# Patient Record
Sex: Female | Born: 1935 | Race: White | Hispanic: No | Smoking: Former smoker
Health system: Southern US, Community
[De-identification: ages and names within clinical notes are randomized; demographics above are authoritative.]

## PROBLEM LIST (undated history)

## (undated) DIAGNOSIS — A0472 Enterocolitis due to Clostridium difficile, not specified as recurrent: Secondary | ICD-10-CM

## (undated) DIAGNOSIS — W19XXXA Unspecified fall, initial encounter: Secondary | ICD-10-CM

## (undated) DIAGNOSIS — K219 Gastro-esophageal reflux disease without esophagitis: Secondary | ICD-10-CM

## (undated) DIAGNOSIS — M1990 Unspecified osteoarthritis, unspecified site: Secondary | ICD-10-CM

## (undated) DIAGNOSIS — J302 Other seasonal allergic rhinitis: Secondary | ICD-10-CM

## (undated) DIAGNOSIS — K529 Noninfective gastroenteritis and colitis, unspecified: Secondary | ICD-10-CM

## (undated) DIAGNOSIS — K5792 Diverticulitis of intestine, part unspecified, without perforation or abscess without bleeding: Secondary | ICD-10-CM

## (undated) HISTORY — PX: TONSILLECTOMY: SUR1361

## (undated) HISTORY — PX: HEMORRHOID SURGERY: SHX153

## (undated) HISTORY — PX: MOLE REMOVAL: SHX2046

## (undated) HISTORY — PX: APPENDECTOMY: SHX54

## (undated) HISTORY — DX: Noninfective gastroenteritis and colitis, unspecified: K52.9

## (undated) HISTORY — PX: CATARACT EXTRACTION, BILATERAL: SHX1313

## (undated) HISTORY — PX: EYE SURGERY: SHX253

---

## 2007-10-05 ENCOUNTER — Ambulatory Visit: Payer: Self-pay

## 2007-10-06 ENCOUNTER — Ambulatory Visit: Payer: Self-pay

## 2008-02-07 ENCOUNTER — Ambulatory Visit: Payer: Self-pay

## 2008-07-09 ENCOUNTER — Ambulatory Visit: Payer: Self-pay | Admitting: Family Medicine

## 2009-01-21 ENCOUNTER — Ambulatory Visit: Payer: Self-pay | Admitting: Internal Medicine

## 2009-11-25 ENCOUNTER — Ambulatory Visit: Payer: Self-pay | Admitting: Internal Medicine

## 2010-02-12 ENCOUNTER — Ambulatory Visit: Payer: Self-pay | Admitting: Internal Medicine

## 2010-08-26 ENCOUNTER — Ambulatory Visit: Payer: Self-pay | Admitting: Internal Medicine

## 2011-06-15 ENCOUNTER — Encounter (INDEPENDENT_AMBULATORY_CARE_PROVIDER_SITE_OTHER): Payer: Medicare Other | Admitting: Ophthalmology

## 2011-06-15 DIAGNOSIS — H43819 Vitreous degeneration, unspecified eye: Secondary | ICD-10-CM

## 2011-06-15 DIAGNOSIS — H4310 Vitreous hemorrhage, unspecified eye: Secondary | ICD-10-CM

## 2011-06-15 DIAGNOSIS — H3530 Unspecified macular degeneration: Secondary | ICD-10-CM

## 2011-07-16 ENCOUNTER — Encounter (INDEPENDENT_AMBULATORY_CARE_PROVIDER_SITE_OTHER): Payer: Medicare Other | Admitting: Ophthalmology

## 2011-07-16 DIAGNOSIS — H33309 Unspecified retinal break, unspecified eye: Secondary | ICD-10-CM

## 2011-07-16 DIAGNOSIS — H3530 Unspecified macular degeneration: Secondary | ICD-10-CM

## 2011-07-16 DIAGNOSIS — H43819 Vitreous degeneration, unspecified eye: Secondary | ICD-10-CM

## 2011-10-26 ENCOUNTER — Ambulatory Visit: Payer: Self-pay | Admitting: Internal Medicine

## 2012-03-16 ENCOUNTER — Ambulatory Visit: Payer: Self-pay | Admitting: Gastroenterology

## 2012-03-18 LAB — PATHOLOGY REPORT

## 2012-07-19 ENCOUNTER — Ambulatory Visit (INDEPENDENT_AMBULATORY_CARE_PROVIDER_SITE_OTHER): Payer: Medicare Other | Admitting: Ophthalmology

## 2012-10-27 ENCOUNTER — Ambulatory Visit: Payer: Self-pay | Admitting: Internal Medicine

## 2012-10-27 IMAGING — MG MM CAD SCREENING MAMMO
1 series · 5 of 5 positions shown · non-contrast
Comparison: [DATE], [DATE], [DATE].

REASON FOR EXAM: scr mammo no order
COMMENTS:

PROCEDURE:     MAM - MAM DGTL SCRN MAM NO ORDER W/CAD  - [DATE]  [DATE]
RESULT:

[R CC · right · 5 of 5 slices shown]
[im 1/5]
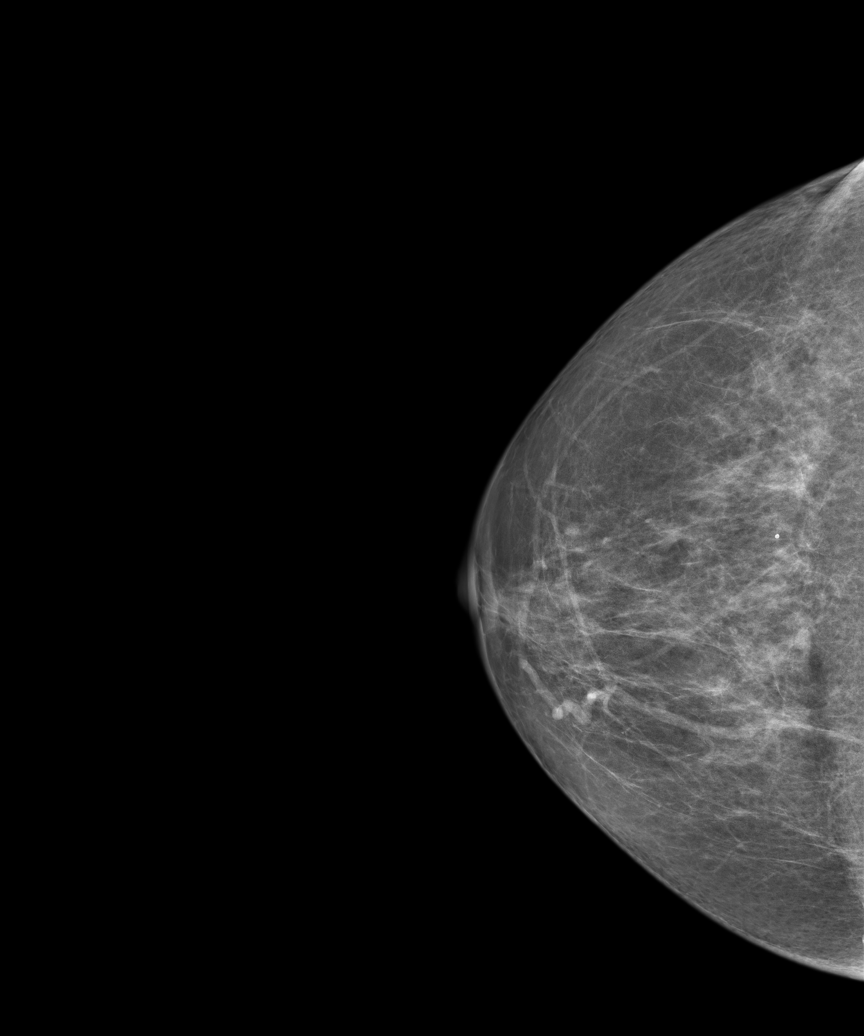
[im 2/5]
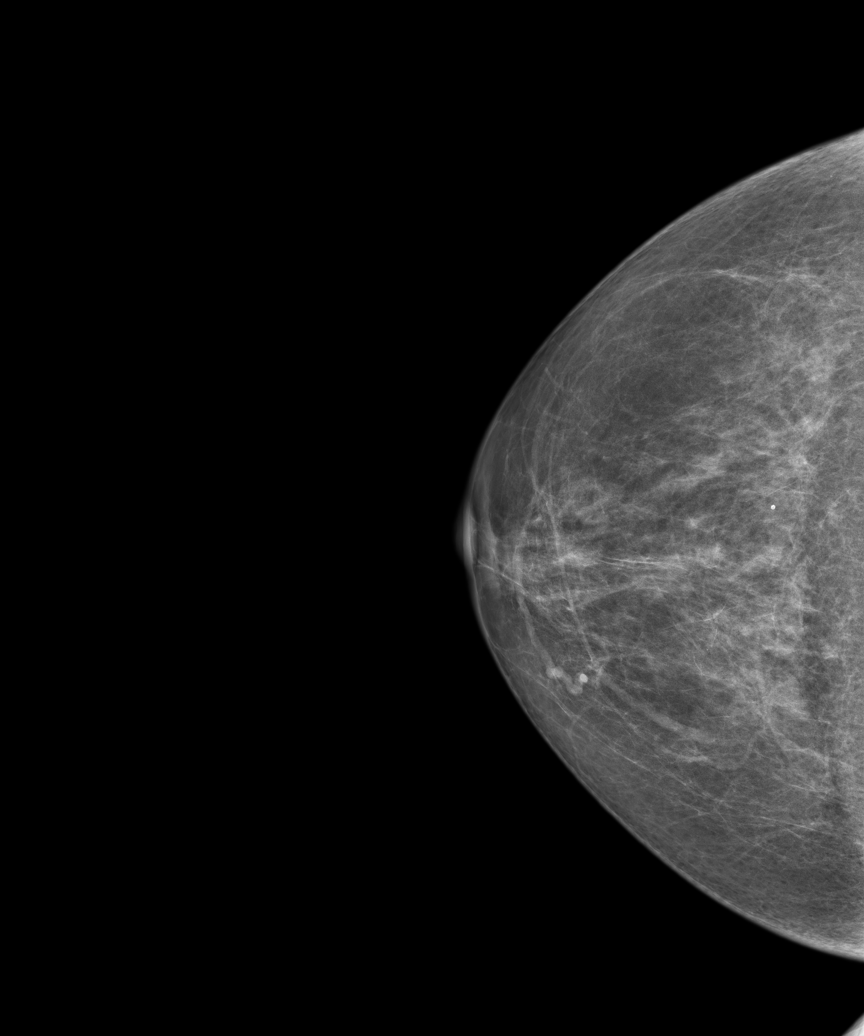
[im 3/5]
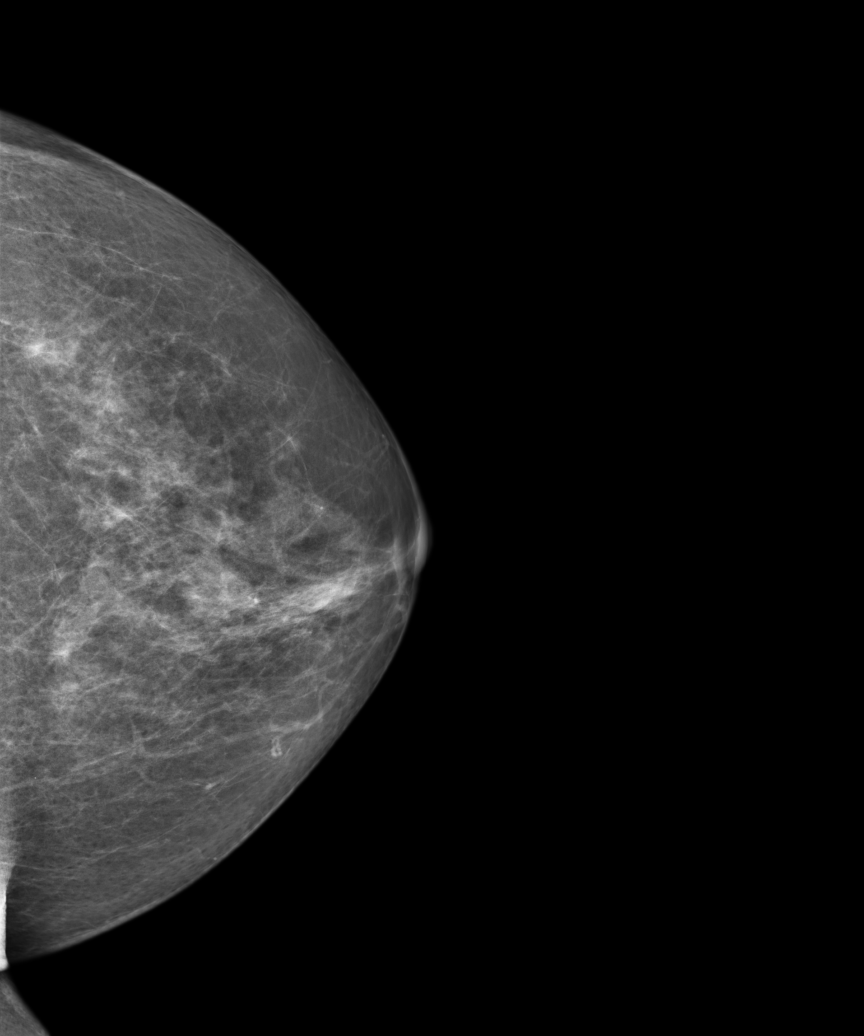
[im 4/5]
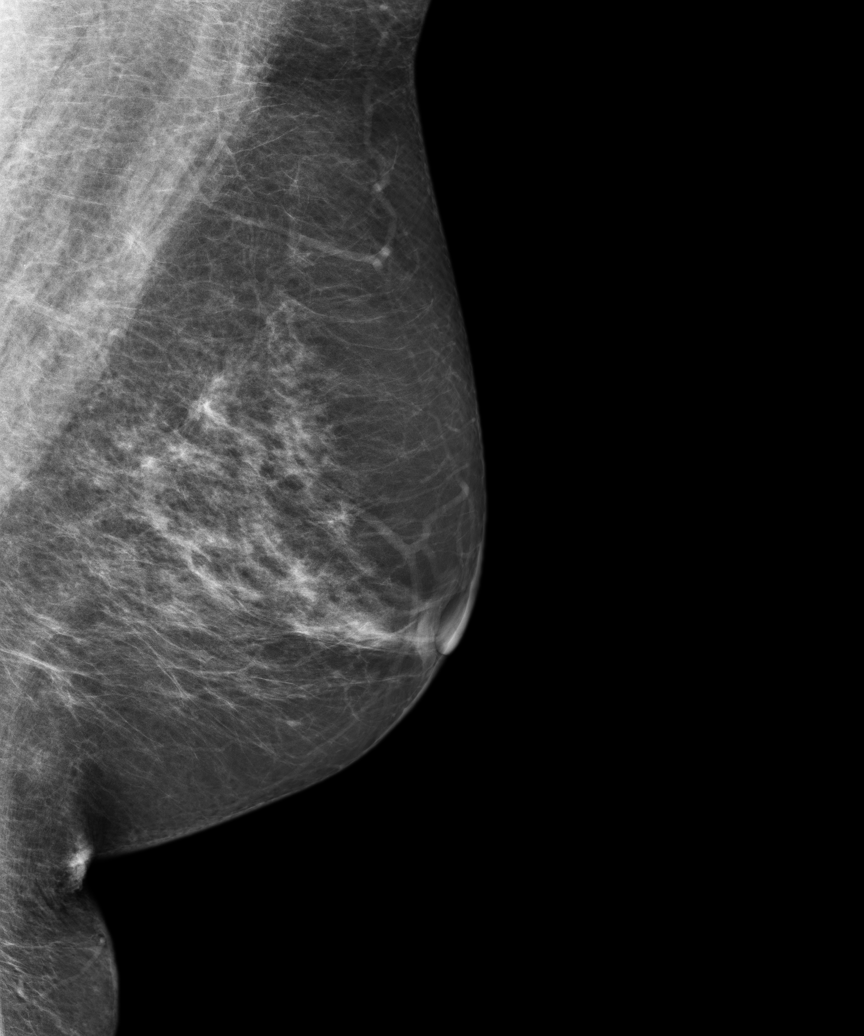
[im 5/5]
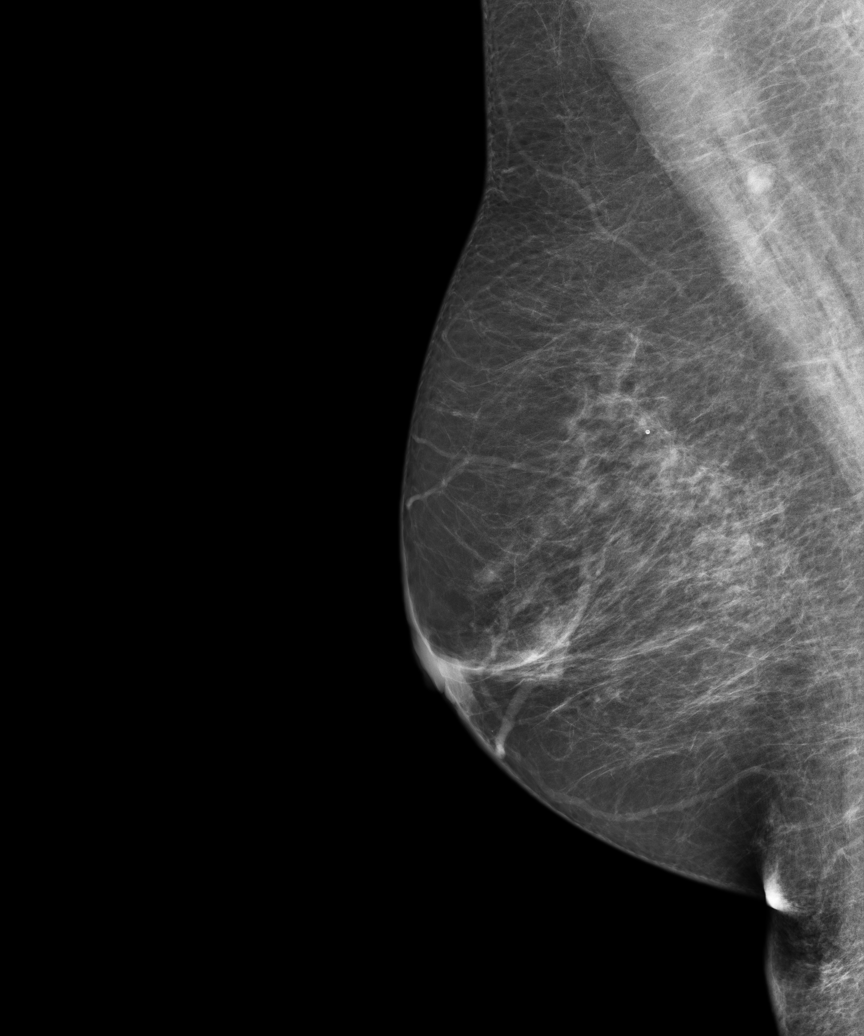

[5 of 5 positions shown; findings below may reference images not displayed]

FINDINGS: There is scattered fibroglandular tissue. No suspicious masses or
calcifications are identified. No areas of architectural distortion.
IMPRESSION: 1.     BI-RADS: Category 1 Negative.
2.     Recommend continued annual screening mammography.

BREAST COMPOSITION: The breast composition is SCATTERED FIBROGLANDULAR
TISSUE (glandular tissue is 25-50%).

Thank you for this opportunity to contribute to the care of your patient.

A NEGATIVE MAMMOGRAM REPORT DOES NOT PRECLUDE BIOPSY OR OTHER EVALUATION OF
A CLINICALLY PALPABLE OR OTHERWISE SUSPICIOUS MASS OR LESION. BREAST CANCER
MAY NOT BE DETECTED BY MAMMOGRAPHY IN UP TO 10% OF CASES.

## 2012-11-03 ENCOUNTER — Ambulatory Visit: Payer: Self-pay | Admitting: Gastroenterology

## 2012-11-07 LAB — PATHOLOGY REPORT

## 2013-01-03 ENCOUNTER — Other Ambulatory Visit: Payer: Self-pay | Admitting: Gastroenterology

## 2013-01-23 ENCOUNTER — Other Ambulatory Visit: Payer: Self-pay | Admitting: Gastroenterology

## 2013-02-07 ENCOUNTER — Other Ambulatory Visit: Payer: Self-pay | Admitting: Gastroenterology

## 2013-02-08 LAB — CLOSTRIDIUM DIFFICILE(ARMC)

## 2013-02-17 ENCOUNTER — Ambulatory Visit: Payer: Self-pay | Admitting: Unknown Physician Specialty

## 2013-03-31 ENCOUNTER — Other Ambulatory Visit: Payer: Self-pay | Admitting: Gastroenterology

## 2013-03-31 LAB — CLOSTRIDIUM DIFFICILE(ARMC)

## 2013-04-01 LAB — WBCS, STOOL

## 2013-04-03 LAB — STOOL CULTURE

## 2013-06-02 ENCOUNTER — Emergency Department: Payer: Self-pay

## 2013-06-02 LAB — CBC
HCT: 38.5 % (ref 35.0–47.0)
HGB: 13.4 g/dL (ref 12.0–16.0)
MCH: 33.3 pg (ref 26.0–34.0)
MCHC: 34.8 g/dL (ref 32.0–36.0)
MCV: 96 fL (ref 80–100)
PLATELETS: 232 10*3/uL (ref 150–440)
RBC: 4.03 10*6/uL (ref 3.80–5.20)
RDW: 12.2 % (ref 11.5–14.5)
WBC: 6.5 10*3/uL (ref 3.6–11.0)

## 2013-06-02 LAB — URINALYSIS, COMPLETE
BILIRUBIN, UR: NEGATIVE
Blood: NEGATIVE
Glucose,UR: NEGATIVE mg/dL (ref 0–75)
Ketone: NEGATIVE
Nitrite: NEGATIVE
PROTEIN: NEGATIVE
Ph: 5 (ref 4.5–8.0)
Specific Gravity: 1.005 (ref 1.003–1.030)
Squamous Epithelial: NONE SEEN
WBC UR: 3 /HPF (ref 0–5)

## 2013-06-02 LAB — COMPREHENSIVE METABOLIC PANEL
ALT: 69 U/L (ref 12–78)
ANION GAP: 6 — AB (ref 7–16)
AST: 42 U/L — AB (ref 15–37)
Albumin: 4.2 g/dL (ref 3.4–5.0)
Alkaline Phosphatase: 81 U/L
BUN: 14 mg/dL (ref 7–18)
Bilirubin,Total: 0.4 mg/dL (ref 0.2–1.0)
CALCIUM: 9.8 mg/dL (ref 8.5–10.1)
CREATININE: 0.77 mg/dL (ref 0.60–1.30)
Chloride: 104 mmol/L (ref 98–107)
Co2: 29 mmol/L (ref 21–32)
Glucose: 94 mg/dL (ref 65–99)
Osmolality: 278 (ref 275–301)
Potassium: 4.1 mmol/L (ref 3.5–5.1)
Sodium: 139 mmol/L (ref 136–145)
Total Protein: 7.9 g/dL (ref 6.4–8.2)

## 2013-06-02 LAB — CLOSTRIDIUM DIFFICILE(ARMC)

## 2013-06-07 LAB — STOOL CULTURE

## 2013-06-22 ENCOUNTER — Encounter: Payer: Self-pay | Admitting: Podiatry

## 2013-06-22 ENCOUNTER — Ambulatory Visit (INDEPENDENT_AMBULATORY_CARE_PROVIDER_SITE_OTHER): Payer: 59 | Admitting: Podiatry

## 2013-06-22 VITALS — BP 127/71 | HR 73 | Resp 16 | Ht 64.0 in | Wt 137.0 lb

## 2013-06-22 DIAGNOSIS — G588 Other specified mononeuropathies: Secondary | ICD-10-CM

## 2013-06-22 DIAGNOSIS — G5760 Lesion of plantar nerve, unspecified lower limb: Secondary | ICD-10-CM

## 2013-06-22 DIAGNOSIS — M79609 Pain in unspecified limb: Secondary | ICD-10-CM

## 2013-06-22 NOTE — Progress Notes (Signed)
She presents today with a chief complaint of irritation of her third interdigital space once again. She states that the neuroma has started bothering me due to to a 15 [place] hike.  Objective: Vital signs are stable she is alert and oriented x3. Pulses are palpable bilateral. Palpable Mulder's click and pain on palpation to the third interdigital space bilateral foot.  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  Plan: Injected Kenalog and local anesthetic today to the bilateral third interdigital space. I will followup with her in 3-4 weeks

## 2013-06-23 ENCOUNTER — Telehealth: Payer: Self-pay | Admitting: *Deleted

## 2013-06-23 NOTE — Telephone Encounter (Signed)
PT CALLED STATED SHE HAD INJECTIONS IN HER FEET, HAD A FEW QUESTIONS. CALLED HER BACK BUT SHE MUST HAVE LEFT FOR OUT OF TOWN

## 2013-07-03 ENCOUNTER — Telehealth: Payer: Self-pay | Admitting: Podiatry

## 2013-07-05 ENCOUNTER — Encounter: Payer: Self-pay | Admitting: Podiatry

## 2013-07-05 ENCOUNTER — Telehealth: Payer: Self-pay | Admitting: Podiatry

## 2013-07-05 NOTE — Telephone Encounter (Signed)
Left message that letter was ready.

## 2013-07-05 NOTE — Telephone Encounter (Signed)
Message copied by Jonnie Finner on Wed Jul 05, 2013  8:37 AM ------      Message from: [place], [place] M      Created: Mon Jul 03, 2013  2:35 PM      Regarding: CART EXCUSE NEEDED      Contact: [phone]       PT WOULD LIKE A LETTER STATING THAT SHE IS NOT ABLE TO WALK THE GOLF COURSE AND NEEDS A CART AT [facility].  PT STATES HER RT FOOT IS FINE BUT LT IS STILL HURTING AFTER SHOTS. ------

## 2013-07-05 NOTE — Telephone Encounter (Signed)
PT WOULD LIKE TO SPEAK WITH DR. HYATT- NOT SURE WHY

## 2013-07-06 NOTE — Telephone Encounter (Signed)
CALLED AND SPOKE TO PT AND SHE STATED HER FOOT STILL HURT. I TOLD HER TO ELEVATE, STAY OFF OF IT, ICE AND TAKE WHAT SHE TAKES FOR HEADACES AND WE WILL SEE HER ON NEXT APPT. SAID IT WAS IMPOSSIBLE TO STAY OFF OF IT BECAUSE SHE IS PLAYING GOLF. TOLD HER TO DO THE BEST SHE CAN AND PT UNDERSTOOD.

## 2013-07-20 ENCOUNTER — Ambulatory Visit (INDEPENDENT_AMBULATORY_CARE_PROVIDER_SITE_OTHER): Payer: 59 | Admitting: Podiatry

## 2013-07-20 ENCOUNTER — Encounter: Payer: Self-pay | Admitting: Podiatry

## 2013-07-20 VITALS — Resp 16 | Ht 64.0 in | Wt 139.0 lb

## 2013-07-20 DIAGNOSIS — G5760 Lesion of plantar nerve, unspecified lower limb: Secondary | ICD-10-CM

## 2013-07-20 DIAGNOSIS — G588 Other specified mononeuropathies: Secondary | ICD-10-CM

## 2013-07-21 NOTE — Progress Notes (Signed)
She presents today for followup of her neuroma third interdigital space bilateral. Last time we injected Kenalog to the interdigital spaces bilaterally. She states it is really not doing much better.  Objective: Vital signs are stable she is alert and oriented x3. At this point I encouraged her to start with dehydrated alcohol. She palpable Mulder's click to the third interspace bilateral foot.  Assessment: Neuroma third interspace bilateral.  Plan #1 first dose of dehydrated alcohol third interdigital space bilateral.

## 2013-07-31 ENCOUNTER — Ambulatory Visit: Payer: Self-pay | Admitting: Gastroenterology

## 2013-08-09 ENCOUNTER — Ambulatory Visit (INDEPENDENT_AMBULATORY_CARE_PROVIDER_SITE_OTHER): Payer: 59 | Admitting: Podiatry

## 2013-08-09 VITALS — BP 134/71 | HR 72 | Resp 16

## 2013-08-09 DIAGNOSIS — G5760 Lesion of plantar nerve, unspecified lower limb: Secondary | ICD-10-CM

## 2013-08-09 DIAGNOSIS — G588 Other specified mononeuropathies: Secondary | ICD-10-CM

## 2013-08-09 NOTE — Progress Notes (Signed)
She presents today states that she couldn't alcohol shots in both feet.  Objective: Vital signs are stable she is alert and oriented x3. Pulses are palpable bilateral. Palpable Mulder's click to the third interdigital space of the bilateral foot. Pain on palpation to these areas.  Assessment: Neuroma third interdigital space bilateral foot.   Plan: First dose of dehydrated alcohol was injected to the bilateral third interdigital spaces today. Followup with her in 3 weeks.

## 2013-08-10 ENCOUNTER — Ambulatory Visit: Payer: 59 | Admitting: Podiatry

## 2013-08-14 ENCOUNTER — Ambulatory Visit: Payer: 59 | Admitting: Podiatry

## 2013-09-04 ENCOUNTER — Ambulatory Visit (INDEPENDENT_AMBULATORY_CARE_PROVIDER_SITE_OTHER): Payer: 59 | Admitting: Podiatry

## 2013-09-04 VITALS — BP 108/59 | HR 81 | Resp 16

## 2013-09-04 DIAGNOSIS — G5760 Lesion of plantar nerve, unspecified lower limb: Secondary | ICD-10-CM

## 2013-09-04 DIAGNOSIS — G588 Other specified mononeuropathies: Secondary | ICD-10-CM

## 2013-09-04 NOTE — Progress Notes (Signed)
She presents today for her third injection of dehydrated alcohol to the third interdigital space of the bilateral foot. She states that she's approximately 50% better on the right foot in nearly well in the left foot.  Objective: Vital signs are stable she is alert and oriented x3. Pain on palpation to the third interdigital space bilaterally with a palpable Mulder's click.  Assessment: Neuroma third interdigital space bilateral.  Plan: Third injection of dehydrated alcohol to the third interdigital space of the bilateral foot was performed today. Followup with her in 3 weeks

## 2013-09-25 ENCOUNTER — Ambulatory Visit (INDEPENDENT_AMBULATORY_CARE_PROVIDER_SITE_OTHER): Payer: 59 | Admitting: Podiatry

## 2013-09-25 ENCOUNTER — Ambulatory Visit: Payer: 59 | Admitting: Podiatry

## 2013-09-25 VITALS — BP 122/61 | HR 79 | Resp 16

## 2013-09-25 DIAGNOSIS — G5760 Lesion of plantar nerve, unspecified lower limb: Secondary | ICD-10-CM

## 2013-09-25 DIAGNOSIS — G588 Other specified mononeuropathies: Secondary | ICD-10-CM

## 2013-09-25 NOTE — Progress Notes (Signed)
She presents today for her fourth dehydrated alcohol injection to the third interdigital space bilaterally. She states that she's doing quite well and not a lot of pain.  Objective: Vital signs are stable she is alert and oriented x3. Pulses are palpable. She still has Mulder's click to the third interdigital space bilaterally with pain left greater than right.  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  Plan: Injected her fourth dose of dehydrated alcohol to the third interdigital space bilateral. Followup with her in 3 weeks

## 2013-10-16 ENCOUNTER — Ambulatory Visit (INDEPENDENT_AMBULATORY_CARE_PROVIDER_SITE_OTHER): Payer: 59 | Admitting: Podiatry

## 2013-10-16 VITALS — BP 131/70 | HR 74 | Resp 16

## 2013-10-16 DIAGNOSIS — G588 Other specified mononeuropathies: Secondary | ICD-10-CM

## 2013-10-16 DIAGNOSIS — G5760 Lesion of plantar nerve, unspecified lower limb: Secondary | ICD-10-CM

## 2013-10-16 NOTE — Progress Notes (Signed)
She presents today stating that she's been doing very well with her neuromas that this was started tingling in her she refers to her third interdigital space of her right foot.  Objective: Pulses are strongly palpable bilateral. Neurologic sensorium is intact per since once the monofilament. She does have a palpable Mulder's click to the third interdigital space right greater than left.  Assessment: Neuromas bilateral.  Plan: Injected the neuromas today once again with dehydrated alcohol.

## 2013-10-18 DIAGNOSIS — I4510 Unspecified right bundle-branch block: Secondary | ICD-10-CM | POA: Insufficient documentation

## 2013-11-06 ENCOUNTER — Ambulatory Visit (INDEPENDENT_AMBULATORY_CARE_PROVIDER_SITE_OTHER): Payer: 59 | Admitting: Podiatry

## 2013-11-06 VITALS — BP 122/73 | HR 65 | Resp 16

## 2013-11-06 DIAGNOSIS — G5760 Lesion of plantar nerve, unspecified lower limb: Secondary | ICD-10-CM

## 2013-11-06 DIAGNOSIS — G588 Other specified mononeuropathies: Secondary | ICD-10-CM

## 2013-11-06 NOTE — Progress Notes (Signed)
She presents today for followup of her neuroma third interdigital space bilaterally. The right seems to be bothering her more now than the left.  Objective: Vital signs are stable she is alert and in x3. She has pain on palpation third interdigital space bilateral foot but much less to the left foot and not with ambulation.  Assessment: Neuroma third interdigital space right foot greater than left.  Plan: Injected her right third interdigital space of dehydrated alcohol. Followup with her in 3 weeks prior to her leaving for holiday.

## 2013-11-21 ENCOUNTER — Ambulatory Visit: Payer: Self-pay | Admitting: Internal Medicine

## 2013-11-29 ENCOUNTER — Encounter: Payer: Self-pay | Admitting: Podiatry

## 2013-11-29 ENCOUNTER — Ambulatory Visit (INDEPENDENT_AMBULATORY_CARE_PROVIDER_SITE_OTHER): Payer: 59 | Admitting: Podiatry

## 2013-11-29 VITALS — BP 120/68 | HR 70 | Resp 16

## 2013-11-29 DIAGNOSIS — G588 Other specified mononeuropathies: Secondary | ICD-10-CM

## 2013-11-29 DIAGNOSIS — G5760 Lesion of plantar nerve, unspecified lower limb: Secondary | ICD-10-CM

## 2013-11-29 MED ORDER — GABAPENTIN 100 MG PO CAPS: ORAL_CAPSULE | ORAL | Status: DC

## 2013-11-29 NOTE — Progress Notes (Signed)
She presents today for followup of her neuroma left foot. She states that the one that was getting better now seems to be hurting more and the right one doesn't seem to be getting better at all she states it is still just tingles all the time day and night consistently the same no better no worse.  Objective: Vital signs are stable she is alert and oriented x3. She has pain on palpation third interdigital space of the left foot no reproducible worsening pain on palpation third interdigital space of the right foot.  Assessment: possible radiculopathy right third interdigital space. Neuroma third interdigital space left.  Plan: Reinjected dehydrated alcohol to the third interdigital space of the left foot. A started her on gabapentin 100 mg 1 by mouth each bedtime we may have to consider evaluation from neurosurgery for the pain clinic.

## 2013-12-13 ENCOUNTER — Ambulatory Visit (INDEPENDENT_AMBULATORY_CARE_PROVIDER_SITE_OTHER): Payer: 59 | Admitting: Podiatry

## 2013-12-13 VITALS — BP 106/63 | HR 78 | Resp 16

## 2013-12-13 DIAGNOSIS — G5760 Lesion of plantar nerve, unspecified lower limb: Secondary | ICD-10-CM

## 2013-12-13 DIAGNOSIS — G5782 Other specified mononeuropathies of left lower limb: Secondary | ICD-10-CM

## 2013-12-14 NOTE — Progress Notes (Signed)
Co painful neuroma prior to trip to [place].  O: pain on palp to neuroma third interdigital space left foot   A: Neuroma  P: Alcohol injection left third ids.

## 2014-01-02 ENCOUNTER — Ambulatory Visit: Payer: Self-pay | Admitting: Internal Medicine

## 2014-01-02 DIAGNOSIS — A0472 Enterocolitis due to Clostridium difficile, not specified as recurrent: Secondary | ICD-10-CM | POA: Insufficient documentation

## 2014-10-05 ENCOUNTER — Other Ambulatory Visit: Payer: Self-pay | Admitting: Physician Assistant

## 2014-10-05 DIAGNOSIS — M2391 Unspecified internal derangement of right knee: Secondary | ICD-10-CM

## 2014-10-12 ENCOUNTER — Ambulatory Visit: Payer: Medicare Other

## 2014-10-16 ENCOUNTER — Ambulatory Visit
Admission: RE | Admit: 2014-10-16 | Discharge: 2014-10-16 | Disposition: A | Discharge status: Home / Self Care | Payer: Medicare Other | Source: Ambulatory Visit | Attending: Physician Assistant | Admitting: Physician Assistant

## 2014-10-16 DIAGNOSIS — M25461 Effusion, right knee: Secondary | ICD-10-CM | POA: Insufficient documentation

## 2014-10-16 DIAGNOSIS — M2241 Chondromalacia patellae, right knee: Secondary | ICD-10-CM | POA: Insufficient documentation

## 2014-10-16 DIAGNOSIS — M23041 Cystic meniscus, anterior horn of lateral meniscus, right knee: Secondary | ICD-10-CM | POA: Diagnosis not present

## 2014-10-16 DIAGNOSIS — M2391 Unspecified internal derangement of right knee: Secondary | ICD-10-CM

## 2014-10-16 DIAGNOSIS — M7121 Synovial cyst of popliteal space [Baker], right knee: Secondary | ICD-10-CM | POA: Diagnosis not present

## 2014-10-16 DIAGNOSIS — M25561 Pain in right knee: Secondary | ICD-10-CM | POA: Diagnosis present

## 2014-10-16 DIAGNOSIS — M1711 Unilateral primary osteoarthritis, right knee: Secondary | ICD-10-CM | POA: Diagnosis not present

## 2014-10-16 DIAGNOSIS — S83281A Other tear of lateral meniscus, current injury, right knee, initial encounter: Secondary | ICD-10-CM | POA: Diagnosis not present

## 2014-10-23 ENCOUNTER — Other Ambulatory Visit: Payer: Self-pay | Admitting: Internal Medicine

## 2014-10-23 DIAGNOSIS — Z1231 Encounter for screening mammogram for malignant neoplasm of breast: Secondary | ICD-10-CM

## 2014-11-15 ENCOUNTER — Encounter
Admission: RE | Admit: 2014-11-15 | Discharge: 2014-11-15 | Disposition: A | Discharge status: Home / Self Care | Payer: Medicare Other | Source: Ambulatory Visit | Attending: Orthopedic Surgery | Admitting: Orthopedic Surgery

## 2014-11-15 DIAGNOSIS — Z01810 Encounter for preprocedural cardiovascular examination: Secondary | ICD-10-CM | POA: Insufficient documentation

## 2014-11-15 HISTORY — DX: Enterocolitis due to Clostridium difficile, not specified as recurrent: A04.72

## 2014-11-15 HISTORY — DX: Gastro-esophageal reflux disease without esophagitis: K21.9

## 2014-11-15 HISTORY — DX: Diverticulitis of intestine, part unspecified, without perforation or abscess without bleeding: K57.92

## 2014-11-15 NOTE — Patient Instructions (Addendum)
  Your procedure is scheduled on: @ADMITDT2 @ November 26 2014 Report to [facility] Entrance To find out your arrival time please call [phone] between 1PM - 3PM on Friday November 23 2014  Remember: Instructions that are not followed completely may result in serious medical risk, up to and including death, or upon the discretion of your surgeon and anesthesiologist your surgery may need to be rescheduled.    _x___ 1. Do not eat food or drink liquids after midnight. No gum chewing or hard candies.     _x__ 2. No Alcohol for 24 hours before or after surgery.   ____ 3. Bring all medications with you on the day of surgery if instructed.    _x__ 4. Notify your doctor if there is any change in your medical condition     (cold, fever, infections).     Do not wear jewelry, make-up, hairpins, clips or nail polish.  Do not wear lotions, powders, or perfumes. You may wear deodorant.  Do not shave 48 hours prior to surgery. Men may shave face and neck.  Do not bring valuables to the hospital.    [facility] is not responsible for any belongings or valuables.               Contacts, dentures or bridgework may not be worn into surgery.  Leave your suitcase in the car. After surgery it may be brought to your room.  For patients admitted to the hospital, discharge time is determined by your                treatment team.   Patients discharged the day of surgery will not be allowed to drive home.   Please read over the following fact sheets that you were given:      __x__ Take these medicines the morning of surgery with A SIP OF WATER:    1.   2.   3.   4.  5.  6.  ____ Fleet Enema (as directed)   _x___ Use CHG Soap as directed  ____ Use inhalers on the day of surgery  ____ Stop metformin 2 days prior to surgery    ____ Take 1/2 of usual insulin dose the night before surgery and none on the morning of surgery.   ____ Stop Coumadin/Plavix/aspirin on   __x__ Stop  Anti-inflammatories on November 15 2014  Advil   __x  Stop supplements until after surgery.  Tumeric, Aspiration  ____ Bring C-Pap to the hospital.

## 2014-11-23 ENCOUNTER — Ambulatory Visit: Payer: Medicare Other

## 2014-11-26 ENCOUNTER — Ambulatory Visit
Admission: RE | Admit: 2014-11-26 | Discharge: 2014-11-26 | Disposition: A | Discharge status: Home / Self Care | Payer: Medicare Other | Source: Ambulatory Visit | Attending: Orthopedic Surgery | Admitting: Orthopedic Surgery

## 2014-11-26 ENCOUNTER — Encounter
Admission: RE | Disposition: A | Discharge status: Home / Self Care | Payer: Self-pay | Source: Ambulatory Visit | Attending: Orthopedic Surgery

## 2014-11-26 ENCOUNTER — Ambulatory Visit: Payer: Medicare Other | Admitting: Anesthesiology

## 2014-11-26 ENCOUNTER — Encounter: Payer: Self-pay | Admitting: *Deleted

## 2014-11-26 DIAGNOSIS — Z803 Family history of malignant neoplasm of breast: Secondary | ICD-10-CM | POA: Diagnosis not present

## 2014-11-26 DIAGNOSIS — M23241 Derangement of anterior horn of lateral meniscus due to old tear or injury, right knee: Secondary | ICD-10-CM | POA: Diagnosis not present

## 2014-11-26 DIAGNOSIS — S83281A Other tear of lateral meniscus, current injury, right knee, initial encounter: Secondary | ICD-10-CM | POA: Diagnosis present

## 2014-11-26 DIAGNOSIS — Z79899 Other long term (current) drug therapy: Secondary | ICD-10-CM | POA: Insufficient documentation

## 2014-11-26 DIAGNOSIS — Z8262 Family history of osteoporosis: Secondary | ICD-10-CM | POA: Insufficient documentation

## 2014-11-26 DIAGNOSIS — I4510 Unspecified right bundle-branch block: Secondary | ICD-10-CM | POA: Insufficient documentation

## 2014-11-26 DIAGNOSIS — Z87891 Personal history of nicotine dependence: Secondary | ICD-10-CM | POA: Insufficient documentation

## 2014-11-26 DIAGNOSIS — M9420 Chondromalacia, unspecified site: Secondary | ICD-10-CM | POA: Insufficient documentation

## 2014-11-26 DIAGNOSIS — Z808 Family history of malignant neoplasm of other organs or systems: Secondary | ICD-10-CM | POA: Insufficient documentation

## 2014-11-26 DIAGNOSIS — M23221 Derangement of posterior horn of medial meniscus due to old tear or injury, right knee: Secondary | ICD-10-CM | POA: Diagnosis not present

## 2014-11-26 DIAGNOSIS — K219 Gastro-esophageal reflux disease without esophagitis: Secondary | ICD-10-CM | POA: Diagnosis not present

## 2014-11-26 DIAGNOSIS — E785 Hyperlipidemia, unspecified: Secondary | ICD-10-CM | POA: Diagnosis not present

## 2014-11-26 HISTORY — PX: KNEE ARTHROSCOPY: SHX127

## 2014-11-26 SURGERY — ARTHROSCOPY, KNEE
Anesthesia: General | Site: Knee | Laterality: Right | Wound class: Clean

## 2014-11-26 MED ORDER — ONDANSETRON HCL 4 MG/2ML IJ SOLN: 4.0000 mg | Freq: Four times a day (QID) | INTRAMUSCULAR | Status: DC | PRN

## 2014-11-26 MED ORDER — METOCLOPRAMIDE HCL 5 MG/ML IJ SOLN: 5.0000 mg | Freq: Three times a day (TID) | INTRAMUSCULAR | Status: DC | PRN

## 2014-11-26 MED ORDER — ONDANSETRON HCL 4 MG/2ML IJ SOLN
INTRAMUSCULAR | Status: DC | PRN
  Administered 2014-11-26: 4 mg via INTRAVENOUS

## 2014-11-26 MED ORDER — BUPIVACAINE-EPINEPHRINE (PF) 0.25% -1:200000 IJ SOLN
INTRAMUSCULAR | Status: AC
  Filled 2014-11-26: qty 30

## 2014-11-26 MED ORDER — MORPHINE SULFATE 4 MG/ML IJ SOLN
INTRAMUSCULAR | Status: DC | PRN
  Administered 2014-11-26: 26 mL via INTRA_ARTICULAR

## 2014-11-26 MED ORDER — ONDANSETRON HCL 4 MG PO TABS: 4.0000 mg | ORAL_TABLET | Freq: Four times a day (QID) | ORAL | Status: DC | PRN

## 2014-11-26 MED ORDER — ACETAMINOPHEN 10 MG/ML IV SOLN
INTRAVENOUS | Status: DC | PRN
  Administered 2014-11-26: 1000 mg via INTRAVENOUS

## 2014-11-26 MED ORDER — BUPIVACAINE-EPINEPHRINE 0.25% -1:200000 IJ SOLN
INTRAMUSCULAR | Status: DC | PRN
  Administered 2014-11-26: 5 mL

## 2014-11-26 MED ORDER — PROPOFOL 10 MG/ML IV BOLUS
INTRAVENOUS | Status: DC | PRN
  Administered 2014-11-26: 150 mg via INTRAVENOUS

## 2014-11-26 MED ORDER — LACTATED RINGERS IV SOLN
INTRAVENOUS | Status: DC
  Administered 2014-11-26: 17:00:00 via INTRAVENOUS

## 2014-11-26 MED ORDER — HYDROCODONE-ACETAMINOPHEN 5-325 MG PO TABS
1.0000 | ORAL_TABLET | ORAL | Status: DC | PRN
  Administered 2014-11-26: 1 via ORAL

## 2014-11-26 MED ORDER — SODIUM CHLORIDE 0.9 % IV SOLN: INTRAVENOUS | Status: DC

## 2014-11-26 MED ORDER — HYDROCODONE-ACETAMINOPHEN 5-325 MG PO TABS
ORAL_TABLET | ORAL | Status: AC
  Filled 2014-11-26: qty 1

## 2014-11-26 MED ORDER — ACETAMINOPHEN 10 MG/ML IV SOLN
INTRAVENOUS | Status: AC
  Filled 2014-11-26: qty 100

## 2014-11-26 MED ORDER — HYDROCODONE-ACETAMINOPHEN 5-325 MG PO TABS: 1.0000 | ORAL_TABLET | ORAL | Status: DC | PRN

## 2014-11-26 MED ORDER — LACTATED RINGERS IR SOLN
Status: DC | PRN
  Administered 2014-11-26: 14400 mL

## 2014-11-26 MED ORDER — MIDAZOLAM HCL 2 MG/2ML IJ SOLN
INTRAMUSCULAR | Status: DC | PRN
  Administered 2014-11-26: 2 mg via INTRAVENOUS

## 2014-11-26 MED ORDER — FENTANYL CITRATE (PF) 100 MCG/2ML IJ SOLN
INTRAMUSCULAR | Status: DC | PRN
  Administered 2014-11-26: 25 ug via INTRAVENOUS
  Administered 2014-11-26: 50 ug via INTRAVENOUS
  Administered 2014-11-26: 25 ug via INTRAVENOUS

## 2014-11-26 MED ORDER — FENTANYL CITRATE (PF) 100 MCG/2ML IJ SOLN
INTRAMUSCULAR | Status: AC
  Administered 2014-11-26: 25 ug via INTRAVENOUS
  Filled 2014-11-26: qty 2

## 2014-11-26 MED ORDER — FAMOTIDINE 20 MG PO TABS: 20.0000 mg | ORAL_TABLET | Freq: Once | ORAL | Status: DC

## 2014-11-26 MED ORDER — FAMOTIDINE 20 MG PO TABS
ORAL_TABLET | ORAL | Status: AC
  Filled 2014-11-26: qty 1

## 2014-11-26 MED ORDER — MORPHINE SULFATE (PF) 4 MG/ML IV SOLN
INTRAVENOUS | Status: AC
  Filled 2014-11-26: qty 1

## 2014-11-26 MED ORDER — ONDANSETRON HCL 4 MG/2ML IJ SOLN: 4.0000 mg | Freq: Once | INTRAMUSCULAR | Status: DC | PRN

## 2014-11-26 MED ORDER — FENTANYL CITRATE (PF) 100 MCG/2ML IJ SOLN
25.0000 ug | INTRAMUSCULAR | Status: DC | PRN
  Administered 2014-11-26 (×4): 25 ug via INTRAVENOUS

## 2014-11-26 MED ORDER — METOCLOPRAMIDE HCL 10 MG PO TABS: 5.0000 mg | ORAL_TABLET | Freq: Three times a day (TID) | ORAL | Status: DC | PRN

## 2014-11-26 SURGICAL SUPPLY — 22 items
BLADE SHAVER 4.5 DBL SERAT CV (CUTTER) ×2 IMPLANT
BNDG ESMARK 6X12 TAN STRL LF (GAUZE/BANDAGES/DRESSINGS) ×2 IMPLANT
DRSG DERMACEA 8X12 NADH (GAUZE/BANDAGES/DRESSINGS) ×2 IMPLANT
DURAPREP 26ML APPLICATOR (WOUND CARE) ×4 IMPLANT
GAUZE SPONGE 4X4 12PLY STRL (GAUZE/BANDAGES/DRESSINGS) ×4 IMPLANT
GLOVE BIOGEL M STRL SZ7.5 (GLOVE) ×2 IMPLANT
GLOVE INDICATOR 8.0 STRL GRN (GLOVE) ×2 IMPLANT
GOWN STRL REUS W/ TWL LRG LVL3 (GOWN DISPOSABLE) ×1 IMPLANT
GOWN STRL REUS W/TWL LRG LVL3 (GOWN DISPOSABLE) ×1
GOWN STRL REUS W/TWL XL LVL4 (GOWN DISPOSABLE) ×2 IMPLANT
IV LACTATED RINGER IRRG 3000ML (IV SOLUTION) ×6
IV LR IRRIG 3000ML ARTHROMATIC (IV SOLUTION) ×6 IMPLANT
MANIFOLD NEPTUNE II (INSTRUMENTS) ×2 IMPLANT
PACK ARTHROSCOPY KNEE (MISCELLANEOUS) ×2 IMPLANT
SET TUBE SUCT SHAVER OUTFL 24K (TUBING) ×2 IMPLANT
SET TUBE TIP INTRA-ARTICULAR (MISCELLANEOUS) ×2 IMPLANT
STRAP SAFETY BODY (MISCELLANEOUS) ×2 IMPLANT
SUT ETHILON 3-0 FS-10 30 BLK (SUTURE) ×2
SUTURE EHLN 3-0 FS-10 30 BLK (SUTURE) ×1 IMPLANT
TUBING ARTHRO INFLOW-ONLY STRL (TUBING) ×2 IMPLANT
WAND HAND CNTRL MULTIVAC 50 (MISCELLANEOUS) ×2 IMPLANT
WRAP KNEE W/COLD PACKS 25.5X14 (SOFTGOODS) ×2 IMPLANT

## 2014-11-26 NOTE — Anesthesia Preprocedure Evaluation (Signed)
Anesthesia Evaluation  Patient identified by MRN, date of birth, ID band Patient awake    Reviewed: Allergy & Precautions, NPO status , Patient's Chart, lab work & pertinent test results, reviewed documented beta blocker date and time   Airway Mallampati: II  TM Distance: >3 FB     Dental  (+) Chipped   Pulmonary former smoker,          Cardiovascular     Neuro/Psych    GI/Hepatic GERD-  ,  Endo/Other    Renal/GU      Musculoskeletal   Abdominal   Peds  Hematology   Anesthesia Other Findings   Reproductive/Obstetrics                             Anesthesia Physical Anesthesia Plan  ASA: II  Anesthesia Plan: General   Post-op Pain Management:    Induction: Intravenous  Airway Management Planned: LMA  Additional Equipment:   Intra-op Plan:   Post-operative Plan:   Informed Consent: I have reviewed the patients History and Physical, chart, labs and discussed the procedure including the risks, benefits and alternatives for the proposed anesthesia with the patient or authorized representative who has indicated his/her understanding and acceptance.     Plan Discussed with: CRNA  Anesthesia Plan Comments:         Anesthesia Quick Evaluation

## 2014-11-26 NOTE — Brief Op Note (Signed)
11/26/2014  6:28 PM  PATIENT:  Kiara [name]  80 y.o. female  PRE-OPERATIVE DIAGNOSIS:  internal derrangement right knee  POST-OPERATIVE DIAGNOSIS:   Tear posterior horn medial meniscus, right knee Tear anterior horn lateral meniscus, right knee Grade 3 chondromalacia, medial compartment Grade 4 chondromalacia, lateral compartment  PROCEDURE:   Right knee arthroscopy, partial medial & lateral meniscectomies, chondroplasty  SURGEON:  Surgeon(s) and Role:    * Dereck Leep, MD - Primary  ASSISTANTS: none   ANESTHESIA:   general  EBL:  Total I/O In: 700 [I.V.:700] Out: - minimal  BLOOD ADMINISTERED:none  DRAINS: none   LOCAL MEDICATIONS USED:  MARCAINE     SPECIMEN:  No Specimen  DISPOSITION OF SPECIMEN:  N/A  COUNTS:  YES  TOURNIQUET:  Not used  DICTATION: .Dragon Dictation  PLAN OF CARE: Discharge to home after PACU  PATIENT DISPOSITION:  PACU - hemodynamically stable.   Delay start of Pharmacological VTE agent (>24hrs) due to surgical blood loss or risk of bleeding: not applicable

## 2014-11-26 NOTE — H&P (Signed)
The patient has been re-examined, and the chart reviewed, and there have been no interval changes to the documented history and physical.    The risks, benefits, and alternatives have been discussed at length. The patient expressed understanding of the risks benefits and agreed with plans for surgical intervention.  Aquilla Voiles P. Caeleb Batalla, Jr. M.D.    

## 2014-11-26 NOTE — Transfer of Care (Signed)
Immediate Anesthesia Transfer of Care Note  Patient: Kiara [name]  Procedure(s) Performed: Procedure(s): ARTHROSCOPY KNEE (Right)  Patient Location: PACU  Anesthesia Type:General  Level of Consciousness: awake  Airway & Oxygen Therapy: Patient Spontanous Breathing and Patient connected to face mask oxygen  Post-op Assessment: Report given to RN and Post -op Vital signs reviewed and stable  Post vital signs: Reviewed and stable  Last Vitals:  Filed Vitals:   11/26/14 1824  BP: 131/66  Pulse: 60  Temp: 36.4 C  Resp: 12    Complications: No apparent anesthesia complications

## 2014-11-26 NOTE — Anesthesia Procedure Notes (Signed)
Procedure Name: LMA Insertion Date/Time: 11/26/2014 4:53 PM Performed by: Jonna Clark Pre-anesthesia Checklist: Patient identified, Patient being monitored, Timeout performed, Emergency Drugs available and Suction available Patient Re-evaluated:Patient Re-evaluated prior to inductionOxygen Delivery Method: Circle system utilized Preoxygenation: Pre-oxygenation with 100% oxygen Intubation Type: IV induction Ventilation: Mask ventilation without difficulty LMA: LMA inserted LMA Size: 3.5 Tube type: Oral Number of attempts: 1 Placement Confirmation: positive ETCO2 and breath sounds checked- equal and bilateral Tube secured with: Tape Dental Injury: Teeth and Oropharynx as per pre-operative assessment

## 2014-11-26 NOTE — Op Note (Signed)
OPERATIVE NOTE  DATE OF SURGERY:  11/26/2014  PATIENT NAME:  Kiara [name]   DOB: 10-05-1934  MRN: [id number]   PRE-OPERATIVE DIAGNOSIS:  Internal derangement of the right knee   POST-OPERATIVE DIAGNOSIS:   Tear of the posterior horn of the medial meniscus, right knee Tear of the anterior horn of the lateral meniscus, right knee Grade 3 chondromalacia of the medial compartment, right knee Grade 4 chondromalacia of the lateral compartment, right knee  PROCEDURE:  Right knee arthroscopy, partial medial and lateral meniscectomies, and chondroplasty of the medial and lateral compartments  SURGEON:  Marciano Sequin., M.D.   ASSISTANT: none  ANESTHESIA: general  ESTIMATED BLOOD LOSS: Minimal  FLUIDS REPLACED: 700 mL of crystalloid  TOURNIQUET TIME: Not used   DRAINS: none  IMPLANTS UTILIZED: None  INDICATIONS FOR SURGERY: Kiara [name] is a 80 y.o. year old female who has been seen for complaints of right knee pain. MRI demonstrated findings consistent with meniscal pathology. After discussion of the risks and benefits of surgical intervention, the patient expressed understanding of the risks benefits and agree with plans for right knee arthroscopy.   PROCEDURE IN DETAIL: The patient was brought into the operating room and, after adequate general anesthesia was achieved, a tourniquet was applied to the right thigh and the leg was placed in the leg holder. All bony prominences were well padded. The patient's right knee was cleaned and prepped with alcohol and Duraprep and draped in the usual sterile fashion. A "timeout" was performed as per usual protocol. The anticipated portal sites were injected with 0.25% Marcaine with epinephrine. An anterolateral incision was made and a cannula was inserted. A large effusion was evacuated and the knee was distended with fluid using the pump. The scope was advanced down the medial gutter into the medial compartment. Under visualization with  the scope, an anteromedial portal was created and a hooked probe was inserted. The medial meniscus was visualized and probed. There was a tear of the posterior horn medial meniscus with a limited horizontal cleavage component. The area of tear was debrided using meniscal punches and a 4.5 mm incisor shaver. Final contouring was performed using a 50 ArthroCare wand. The articular cartilage was visualized. There were areas of grade 3 chondromalacia involving both the medial femoral condyle and medial tibial plateau. These areas were debrided and contoured using the 50 ArthroCare wand.  The scope was then advanced into the intercondylar notch. The anterior cruciate ligament was visualized and probed and felt to be intact. The scope was removed from the lateral portal and reinserted via the anteromedial portal to better visualize the lateral compartment. The lateral meniscus was visualized and probed. A complex degenerative tear of the anterior horn of the lateral meniscus was encountered that extended to the mid body of the meniscus. The tear was debrided using the 4.5 mm incisor shaver. Final contouring was performed using a 50 ArthroCare wand. The remaining rim meniscus was incised and probed and felt to be stable. The articular cartilage of the lateral compartment was visualized. Grade 3 changes of chondromalacia were noted to the lateral femoral condyle and a localized area of grade 4 chondromalacia was noted to the lateral tibial plateau. These areas were debrided and contoured using the 50 ArthroCare wand. Finally, the scope was advanced so as to visualize the patellofemoral articulation. Good patellar tracking was appreciated. Mild chondral changes were noted to the patellofemoral articulation these areas were debrided using the ArthroCare wand.  The knee was  irrigated with copius amounts of fluid and suctioned dry. The anterolateral portal was re-approximated with #3-0 nylon. A combination of 0.25%  Marcaine with epinephrine and 4 mg of Morphine were injected via the scope. The scope was removed and the anteromedial portal was re-approximated with #3-0 nylon. A sterile dressing was applied followed by application of an ice wrap.  The patient tolerated the procedure well and was transported to the PACU in stable condition.  [name] P. Holley Bouche., M.D.

## 2014-11-26 NOTE — Discharge Instructions (Signed)
°  Instructions after Knee Arthroscopy  ° ° William Laske P. Diogenes Whirley, Jr., M.D.    ° Dept. of Orthopaedics & Sports Medicine ° [facility] ° [address] ° [place], [place]  [address] ° ° Phone: [phone]   Fax: [phone] ° ° °DIET: °• Drink plenty of non-alcoholic fluids & begin a light diet. °• Resume your normal diet the day after surgery. ° °ACTIVITY:  °• You may use crutches or a walker with weight-bearing as tolerated, unless instructed otherwise. °• You may wean yourself off of the walker or crutches as tolerated.  °• Begin doing gentle exercises. Exercising will reduce the pain and swelling, increase motion, and prevent muscle weakness.   °• Avoid strenuous activities or athletics for a minimum of 4-6 weeks after arthroscopic surgery. °• Do not drive or operate any equipment until instructed. ° °WOUND CARE:  °• Place one to two pillows under the knee the first day or two when sitting or lying.  °• Continue to use the ice packs periodically to reduce pain and swelling. °• The small incisions in your knee are closed with nylon stitches. The stitches will be removed in the office. °• The bulky dressing may be removed on the second day after surgery. DO NOT TOUCH THE STITCHES. Put a Band-Aid over each stitch. Do NOT use any ointments or creams on the incisions.  °• You may bathe or shower after the stitches are removed at the first office visit following surgery. ° °MEDICATIONS: °• You may resume your regular medications. °• Please take the pain medication as prescribed. °• Do not take pain medication on an empty stomach. °• Do not drive or drink alcoholic beverages when taking pain medications. ° °CALL THE OFFICE FOR: °• Temperature above 101 degrees °• Excessive bleeding or drainage on the dressing. °• Excessive swelling, coldness, or paleness of the toes. °• Persistent nausea and vomiting. ° °FOLLOW-UP:  °• You should have an appointment to return to the office in 7-10 days after surgery.  °  °

## 2014-11-27 ENCOUNTER — Encounter: Payer: Self-pay | Admitting: Orthopedic Surgery

## 2014-11-27 ENCOUNTER — Ambulatory Visit: Payer: Medicare Other

## 2014-11-28 NOTE — Anesthesia Postprocedure Evaluation (Signed)
  Anesthesia Post-op Note  Patient: Kiara [name]  Procedure(s) Performed: Procedure(s): ARTHROSCOPY KNEE, Tear medical horn and anterior horn. Lateral mesicus tear, grade 3 medial (Right)  Anesthesia type:General  Patient location: PACU  Post pain: Pain level controlled  Post assessment: Post-op Vital signs reviewed, Patient's Cardiovascular Status Stable, Respiratory Function Stable, Patent Airway and No signs of Nausea or vomiting  Post vital signs: Reviewed and stable  Last Vitals:  Filed Vitals:   11/26/14 1925  BP: 141/76  Pulse: 61  Temp: 36.8 C  Resp: 15    Level of consciousness: awake, alert  and patient cooperative  Complications: No apparent anesthesia complications

## 2014-12-06 ENCOUNTER — Ambulatory Visit: Payer: Medicare Other | Attending: Internal Medicine

## 2014-12-12 ENCOUNTER — Other Ambulatory Visit: Payer: Self-pay | Admitting: Internal Medicine

## 2014-12-12 DIAGNOSIS — R945 Abnormal results of liver function studies: Principal | ICD-10-CM

## 2014-12-12 DIAGNOSIS — R7989 Other specified abnormal findings of blood chemistry: Secondary | ICD-10-CM

## 2014-12-13 ENCOUNTER — Ambulatory Visit
Admission: RE | Admit: 2014-12-13 | Discharge: 2014-12-13 | Disposition: A | Discharge status: Home / Self Care | Payer: Medicare Other | Source: Ambulatory Visit | Attending: Internal Medicine | Admitting: Internal Medicine

## 2014-12-13 DIAGNOSIS — R945 Abnormal results of liver function studies: Secondary | ICD-10-CM

## 2014-12-13 DIAGNOSIS — R1084 Generalized abdominal pain: Secondary | ICD-10-CM | POA: Diagnosis not present

## 2014-12-13 DIAGNOSIS — R7989 Other specified abnormal findings of blood chemistry: Secondary | ICD-10-CM | POA: Diagnosis present

## 2015-01-30 ENCOUNTER — Other Ambulatory Visit: Payer: Self-pay | Admitting: Internal Medicine

## 2015-01-30 ENCOUNTER — Ambulatory Visit
Admission: RE | Admit: 2015-01-30 | Discharge: 2015-01-30 | Disposition: A | Discharge status: Home / Self Care | Payer: Medicare Other | Source: Ambulatory Visit | Attending: Internal Medicine | Admitting: Internal Medicine

## 2015-01-30 DIAGNOSIS — Z1231 Encounter for screening mammogram for malignant neoplasm of breast: Secondary | ICD-10-CM

## 2015-05-09 ENCOUNTER — Emergency Department
Admission: EM | Admit: 2015-05-09 | Discharge: 2015-05-10 | Disposition: A | Discharge status: Home / Self Care | Payer: Medicare Other | Attending: Emergency Medicine | Admitting: Emergency Medicine

## 2015-05-09 ENCOUNTER — Encounter: Payer: Self-pay | Admitting: Emergency Medicine

## 2015-05-09 ENCOUNTER — Emergency Department: Payer: Medicare Other

## 2015-05-09 DIAGNOSIS — Z79899 Other long term (current) drug therapy: Secondary | ICD-10-CM | POA: Insufficient documentation

## 2015-05-09 DIAGNOSIS — K5732 Diverticulitis of large intestine without perforation or abscess without bleeding: Secondary | ICD-10-CM | POA: Diagnosis not present

## 2015-05-09 DIAGNOSIS — R1030 Lower abdominal pain, unspecified: Secondary | ICD-10-CM | POA: Diagnosis present

## 2015-05-09 DIAGNOSIS — Z87891 Personal history of nicotine dependence: Secondary | ICD-10-CM | POA: Insufficient documentation

## 2015-05-09 LAB — COMPREHENSIVE METABOLIC PANEL
ALBUMIN: 4.4 g/dL (ref 3.5–5.0)
ALK PHOS: 91 U/L (ref 38–126)
ALT: 33 U/L (ref 14–54)
ANION GAP: 9 (ref 5–15)
AST: 28 U/L (ref 15–41)
BILIRUBIN TOTAL: 0.7 mg/dL (ref 0.3–1.2)
BUN: 18 mg/dL (ref 6–20)
CALCIUM: 9.5 mg/dL (ref 8.9–10.3)
CO2: 28 mmol/L (ref 22–32)
Chloride: 98 mmol/L — ABNORMAL LOW (ref 101–111)
Creatinine, Ser: 0.99 mg/dL (ref 0.44–1.00)
GFR calc Af Amer: >60 mL/min (ref >60)
GFR calc non Af Amer: 53 mL/min — ABNORMAL LOW (ref >60)
GLUCOSE: 100 mg/dL — AB (ref 65–99)
Potassium: 3.8 mmol/L (ref 3.5–5.1)
Sodium: 135 mmol/L (ref 135–145)
TOTAL PROTEIN: 8.4 g/dL — AB (ref 6.5–8.1)

## 2015-05-09 LAB — URINALYSIS COMPLETE WITH MICROSCOPIC (ARMC ONLY)
BILIRUBIN URINE: NEGATIVE
Bacteria, UA: NONE SEEN
GLUCOSE, UA: NEGATIVE mg/dL
KETONES UR: NEGATIVE mg/dL
LEUKOCYTES UA: NEGATIVE
NITRITE: NEGATIVE
Protein, ur: NEGATIVE mg/dL
RBC / HPF: NONE SEEN RBC/hpf (ref 0–5)
SPECIFIC GRAVITY, URINE: 1.003 — AB (ref 1.005–1.030)
WBC, UA: NONE SEEN WBC/hpf (ref 0–5)
pH: 6 (ref 5.0–8.0)

## 2015-05-09 LAB — CBC
HEMATOCRIT: 35.7 % (ref 35.0–47.0)
Hemoglobin: 12.3 g/dL (ref 12.0–16.0)
MCH: 33 pg (ref 26.0–34.0)
MCHC: 34.4 g/dL (ref 32.0–36.0)
MCV: 95.9 fL (ref 80.0–100.0)
PLATELETS: 238 10*3/uL (ref 150–440)
RBC: 3.72 MIL/uL — ABNORMAL LOW (ref 3.80–5.20)
RDW: 12.4 % (ref 11.5–14.5)
WBC: 5.7 10*3/uL (ref 3.6–11.0)

## 2015-05-09 LAB — LIPASE, BLOOD: Lipase: 31 U/L (ref 11–51)

## 2015-05-09 MED ORDER — IOHEXOL 240 MG/ML SOLN
25.0000 mL | INTRAMUSCULAR | Status: AC
  Administered 2015-05-09: 25 mL via ORAL

## 2015-05-09 NOTE — ED Notes (Signed)
Pt reports umbilical pain x3 days; sent by [facility] MD due to continue pain. Pt denies nausea, vomiting, diarrhea. Pt reports some constipation.

## 2015-05-09 NOTE — ED Notes (Signed)
Pt ambulated to the bathroom to void and returned to her bed.

## 2015-05-09 NOTE — ED Notes (Signed)
Pt returned from CT °

## 2015-05-09 NOTE — ED Notes (Signed)
Pt went to CT

## 2015-05-10 MED ORDER — TRAMADOL HCL 50 MG PO TABS: 50.0000 mg | ORAL_TABLET | Freq: Four times a day (QID) | ORAL | Status: DC | PRN

## 2015-05-10 NOTE — Discharge Instructions (Signed)
Diverticulitis Diverticulitis is inflammation or infection of small pouches in your colon that form when you have a condition called diverticulosis. The pouches in your colon are called diverticula. Your colon, or large intestine, is where water is absorbed and stool is formed. Complications of diverticulitis can include:  Bleeding.  Severe infection.  Severe pain.  Perforation of your colon.  Obstruction of your colon. CAUSES  Diverticulitis is caused by bacteria. Diverticulitis happens when stool becomes trapped in diverticula. This allows bacteria to grow in the diverticula, which can lead to inflammation and infection. RISK FACTORS People with diverticulosis are at risk for diverticulitis. Eating a diet that does not include enough fiber from fruits and vegetables may make diverticulitis more likely to develop. SYMPTOMS  Symptoms of diverticulitis may include:  Abdominal pain and tenderness. The pain is normally located on the left side of the abdomen, but may occur in other areas.  Fever and chills.  Bloating.  Cramping.  Nausea.  Vomiting.  Constipation.  Diarrhea.  Blood in your stool. DIAGNOSIS  Your health care provider will ask you about your medical history and do a physical exam. You may need to have tests done because many medical conditions can cause the same symptoms as diverticulitis. Tests may include:  Blood tests.  Urine tests.  Imaging tests of the abdomen, including X-rays and CT scans. When your condition is under control, your health care provider may recommend that you have a colonoscopy. A colonoscopy can show how severe your diverticula are and whether something else is causing your symptoms. TREATMENT  Most cases of diverticulitis are mild and can be treated at home. Treatment may include:  Taking over-the-counter pain medicines.  Following a clear liquid diet.  Taking antibiotic medicines by mouth for 7-10 days. More severe cases may  be treated at a hospital. Treatment may include:  Not eating or drinking.  Taking prescription pain medicine.  Receiving antibiotic medicines through an IV tube.  Receiving fluids and nutrition through an IV tube.  Surgery. HOME CARE INSTRUCTIONS   Follow your health care provider's instructions carefully.  Follow a full liquid diet or other diet as directed by your health care provider. After your symptoms improve, your health care provider may tell you to change your diet. He or she may recommend you eat a high-fiber diet. Fruits and vegetables are good sources of fiber. Fiber makes it easier to pass stool.  Take fiber supplements or probiotics as directed by your health care provider.  Only take medicines as directed by your health care provider.  Keep all your follow-up appointments. SEEK MEDICAL CARE IF:   Your pain does not improve.  You have a hard time eating food.  Your bowel movements do not return to normal. SEEK IMMEDIATE MEDICAL CARE IF:   Your pain becomes worse.  Your symptoms do not get better.  Your symptoms suddenly get worse.  You have a fever.  You have repeated vomiting.  You have bloody or black, tarry stools. MAKE SURE YOU:   Understand these instructions.  Will watch your condition.  Will get help right away if you are not doing well or get worse.   This information is not intended to replace advice given to you by your health care provider. Make sure you discuss any questions you have with your health care provider.   Document Released: 12/24/2004 Document Revised: 03/21/2013 Document Reviewed: 02/08/2013 Elsevier Interactive Patient Education [employer].  Please return immediately if condition worsens. Please contact  her primary physician or the physician you were given for referral. If you have any specialist physicians involved in her treatment and plan please also contact them. Thank you for using [facility].

## 2015-05-10 NOTE — ED Provider Notes (Signed)
Time Seen: Approximately 1950  I have reviewed the triage notes  Chief Complaint: Abdominal Pain   History of Present Illness: Kiara [name] is a 81 y.o. female who states that she's had lower abdominal pain now for the last 3 days and points mainly to the lower middle quadrant. Patient's states that she was referred here by her primary physician after pain did not seem to improve with initiation of antibiotics. She is currently being treated for diverticulitis and is on a prescription for Bactrim and Flagyl. Eyes any fever at home. She denies any persistent vomiting. She denies any dysuria, hematuria, urinary frequency. She denies any melena or hematochezia.   Past Medical History  Diagnosis Date  . Colitis   . GERD (gastroesophageal reflux disease)   . Diverticulitis   . C. difficile colitis     There are no active problems to display for this patient.   Past Surgical History  Procedure Laterality Date  . Tonsillectomy    . Appendectomy    . Eye surgery    . Knee arthroscopy Right 11/26/2014    Procedure: ARTHROSCOPY KNEE, Tear medical horn and anterior horn. Lateral mesicus tear, grade 3 medial;  Surgeon: Dereck Leep, MD;  Location: [facility] ORS;  Service: Orthopedics;  Laterality: Right;    Past Surgical History  Procedure Laterality Date  . Tonsillectomy    . Appendectomy    . Eye surgery    . Knee arthroscopy Right 11/26/2014    Procedure: ARTHROSCOPY KNEE, Tear medical horn and anterior horn. Lateral mesicus tear, grade 3 medial;  Surgeon: Dereck Leep, MD;  Location: [facility] ORS;  Service: Orthopedics;  Laterality: Right;    Current Outpatient Rx  Name  Route  Sig  Dispense  Refill  . bismuth subsalicylate (PEPTO BISMOL) 262 MG/15ML suspension   Oral   Take 30 mLs by mouth every 6 (six) hours as needed for indigestion.         . Bromelains 500 MG TABS   Oral   Take 1 tablet by mouth 3 (three) times daily after meals.         . gabapentin (NEURONTIN) 100  MG capsule      Take one tablet by mouth at bed time. Patient not taking: Reported on 11/26/2014   30 capsule   3   . HYDROcodone-acetaminophen (NORCO) 5-325 MG per tablet   Oral   Take 1-2 tablets by mouth every 4 (four) hours as needed for moderate pain.   60 tablet   0   . ibuprofen (ADVIL,MOTRIN) 200 MG tablet   Oral   Take 200 mg by mouth every 8 (eight) hours as needed.         . Multiple Vitamins-Minerals (CENTRUM SILVER PO)   Oral   Take 1 tablet by mouth daily.         . Probiotic Product (PROBIOTIC DAILY PO)   Oral   Take by mouth daily.         . traMADol (ULTRAM) 50 MG tablet   Oral   Take 1 tablet (50 mg total) by mouth every 6 (six) hours as needed.   20 tablet   0   . UNABLE TO FIND   Oral   Take 3 tablets by mouth every morning. Wholenzyme           Allergies:  Review of patient's allergies indicates no known allergies.  Family History: Family History  Problem Relation Age of Onset  . Breast  cancer Paternal Aunt   . Bone cancer Paternal Uncle     Social History: Social History  Substance Use Topics  . Smoking status: Former Research scientist (life sciences)  . Smokeless tobacco: None  . Alcohol Use: 1.2 oz/week    2 Glasses of wine per week     Comment: daily     Review of Systems:   10 point review of systems was performed and was otherwise negative:  Constitutional: No fever Eyes: No visual disturbances ENT: No sore throat, ear pain Cardiac: No chest pain Respiratory: No shortness of breath, wheezing, or stridor Abdomen: Lower middle quadrant abdominal pain Endocrine: No weight loss, No night sweats Extremities: No peripheral edema, cyanosis Skin: No rashes, easy bruising Neurologic: No focal weakness, trouble with speech or swollowing Urologic: No dysuria, Hematuria, or urinary frequency   Physical Exam:  ED Triage Vitals  Enc Vitals Group     BP 05/09/15 1834 150/64 mmHg     Pulse Rate 05/09/15 1834 78     Resp 05/09/15 1834 18      Temp 05/09/15 1834 97.7 F (36.5 C)     Temp Source 05/09/15 1834 Oral     SpO2 05/09/15 1834 98 %     Weight 05/09/15 1834 143 lb (64.864 kg)     Height 05/09/15 1834 5\' 4"  (1.626 m)     Head Cir --      Peak Flow --      Pain Score 05/09/15 1839 2     Pain Loc --      Pain Edu? --      Excl. in [place]? --     General: Awake , Alert , and Oriented times 3; GCS 15 Head: Normal cephalic , atraumatic Eyes: Pupils equal , round, reactive to light Nose/Throat: No nasal drainage, patent upper airway without erythema or exudate.  Neck: Supple, Full range of motion, No anterior adenopathy or palpable thyroid masses Lungs: Clear to ascultation without wheezes , rhonchi, or rales Heart: Regular rate, regular rhythm without murmurs , gallops , or rubs Abdomen: Soft, non tender without rebound, guarding , or rigidity; bowel sounds positive and symmetric in all 4 quadrants. No organomegaly .        Extremities: 2 plus symmetric pulses. No edema, clubbing or cyanosis Neurologic: normal ambulation, Motor symmetric without deficits, sensory intact Skin: warm, dry, no rashes   Labs:   All laboratory work was reviewed including any pertinent negatives or positives listed below:  Labs Reviewed  COMPREHENSIVE METABOLIC PANEL - Abnormal; Notable for the following:    Chloride 98 (*)    Glucose, Bld 100 (*)    Total Protein 8.4 (*)    GFR calc non Af Amer 53 (*)    All other components within normal limits  CBC - Abnormal; Notable for the following:    RBC 3.72 (*)    All other components within normal limits  URINALYSIS COMPLETEWITH MICROSCOPIC ([facility] ONLY) - Abnormal; Notable for the following:    Color, Urine STRAW (*)    APPearance CLEAR (*)    Specific Gravity, Urine 1.003 (*)    Hgb urine dipstick 1+ (*)    Squamous Epithelial / LPF 0-5 (*)    All other components within normal limits  LIPASE, BLOOD     Radiology: *  EXAM: CT ABDOMEN AND PELVIS WITHOUT  CONTRAST  TECHNIQUE: Multidetector CT imaging of the abdomen and pelvis was performed following the standard protocol without IV contrast.  COMPARISON: CT abdomen pelvis  02/12/2010. Also outside scan dated 02/15/2012.  FINDINGS: Lower chest: No acute findings.  Hepatobiliary: No mass visualized on this un-enhanced exam.  Pancreas: No mass or inflammatory process identified on this un-enhanced exam.  Spleen: Within normal limits in size.  Adrenals/Urinary Tract: No evidence nephrolithiasis. Slight RIGHT hydronephrosis and hydroureter without visible calculi. No abnormality on the LEFT. Within limits for assessment on noncontrast exam, no renal masses.  Stomach/Bowel: No evidence of bowel obstruction or perforation. In the sigmoid colon there is extensive diverticulosis with bowel wall thickening, and slight pericolonic fluid, consistent with diverticulitis. Mild stranding of the pericolonic fat. No visible perforation or abscess.  Vascular/Lymphatic: No pathologically enlarged lymph nodes. No evidence of abdominal aortic aneurysm.  Reproductive: Calcified fibroids. No mass.  Other: None.  Musculoskeletal: No suspicious bone lesions identified.  IMPRESSION: Sigmoid diverticulitis. No evidence for frank perforation or abscess.   I personally reviewed the radiologic studies     ED Course: * *Patient's stay was uneventful and based on her clinical presentation and objective findings appears that she has some sigmoid diverticulitis which is non-complicated. She does not appear to have an abscess or perforation at this time. She was given a prescription for Ultram for pain and was advised drink plenty of fluids and continue her oral antibiotics. She has a history of C. difficile talked about the Bactrim and the Flagyl. She was given the option to take Flagyl by itself at this time.    Assessment: Sigmoid diverticulitis Final Clinical Impression: * Final  diagnoses:  Diverticulitis of large intestine without perforation or abscess without bleeding     Plan: Outpatient management Patient was advised to return immediately if condition worsens. Patient was advised to follow up with their primary care physician or other specialized physicians involved in their outpatient care Patient was advised especially return if she develops a fever, increased pain, bloody stool, or any other new concerns            Daymon Larsen, MD 05/10/15 [id number]

## 2015-10-24 DIAGNOSIS — E782 Mixed hyperlipidemia: Secondary | ICD-10-CM | POA: Insufficient documentation

## 2015-12-24 ENCOUNTER — Other Ambulatory Visit: Payer: Self-pay | Admitting: Internal Medicine

## 2015-12-24 DIAGNOSIS — Z1231 Encounter for screening mammogram for malignant neoplasm of breast: Secondary | ICD-10-CM

## 2016-01-09 ENCOUNTER — Ambulatory Visit: Payer: Self-pay | Admitting: Orthopedic Surgery

## 2016-01-25 ENCOUNTER — Emergency Department
Admission: EM | Admit: 2016-01-25 | Discharge: 2016-01-25 | Disposition: A | Discharge status: Home / Self Care | Payer: Medicare Other | Attending: Emergency Medicine | Admitting: Emergency Medicine

## 2016-01-25 ENCOUNTER — Encounter: Payer: Self-pay | Admitting: Emergency Medicine

## 2016-01-25 DIAGNOSIS — M542 Cervicalgia: Secondary | ICD-10-CM | POA: Insufficient documentation

## 2016-01-25 DIAGNOSIS — M25511 Pain in right shoulder: Secondary | ICD-10-CM | POA: Insufficient documentation

## 2016-01-25 DIAGNOSIS — Z791 Long term (current) use of non-steroidal anti-inflammatories (NSAID): Secondary | ICD-10-CM | POA: Insufficient documentation

## 2016-01-25 DIAGNOSIS — Z87891 Personal history of nicotine dependence: Secondary | ICD-10-CM | POA: Diagnosis not present

## 2016-01-25 DIAGNOSIS — Y929 Unspecified place or not applicable: Secondary | ICD-10-CM | POA: Insufficient documentation

## 2016-01-25 DIAGNOSIS — S0181XA Laceration without foreign body of other part of head, initial encounter: Secondary | ICD-10-CM | POA: Diagnosis not present

## 2016-01-25 DIAGNOSIS — M25561 Pain in right knee: Secondary | ICD-10-CM

## 2016-01-25 DIAGNOSIS — M25461 Effusion, right knee: Secondary | ICD-10-CM | POA: Diagnosis not present

## 2016-01-25 DIAGNOSIS — Y999 Unspecified external cause status: Secondary | ICD-10-CM | POA: Insufficient documentation

## 2016-01-25 DIAGNOSIS — Z79899 Other long term (current) drug therapy: Secondary | ICD-10-CM | POA: Diagnosis not present

## 2016-01-25 DIAGNOSIS — S80212A Abrasion, left knee, initial encounter: Secondary | ICD-10-CM | POA: Diagnosis not present

## 2016-01-25 DIAGNOSIS — T07XXXA Unspecified multiple injuries, initial encounter: Secondary | ICD-10-CM

## 2016-01-25 DIAGNOSIS — Y9301 Activity, walking, marching and hiking: Secondary | ICD-10-CM | POA: Insufficient documentation

## 2016-01-25 DIAGNOSIS — W01198A Fall on same level from slipping, tripping and stumbling with subsequent striking against other object, initial encounter: Secondary | ICD-10-CM | POA: Diagnosis not present

## 2016-01-25 DIAGNOSIS — M25562 Pain in left knee: Secondary | ICD-10-CM

## 2016-01-25 NOTE — ED Notes (Signed)
See triage note  States she fell this am  Having pain to both knees  Small superficial laceration noted to right temporal area

## 2016-01-25 NOTE — ED Provider Notes (Signed)
[facility] Emergency Department Provider Note  ____________________________________________  Time seen: Approximately 7:12 PM  I have reviewed the triage vital signs and the nursing notes.   HISTORY  Chief Complaint Facial Laceration    HPI Kiara [name] is a 81 y.o. female who fell this morning forward hitting her knees, right shoulder and her glasses cut the outer aspect of the right eye. She denies head injury. Has mild neck pain. No loss of consciousness. History of severe knee arthritis with anticipated knee replacement in a couple weeks. She is ambulating without much difficulty. She has abrasions to the knees bilaterally.   Past Medical History:  Diagnosis Date  . C. difficile colitis   . Colitis   . Diverticulitis   . GERD (gastroesophageal reflux disease)     There are no active problems to display for this patient.   Past Surgical History:  Procedure Laterality Date  . APPENDECTOMY    . EYE SURGERY    . KNEE ARTHROSCOPY Right 11/26/2014   Procedure: ARTHROSCOPY KNEE, Tear medical horn and anterior horn. Lateral mesicus tear, grade 3 medial;  Surgeon: Dereck Leep, MD;  Location: [facility] ORS;  Service: Orthopedics;  Laterality: Right;  . TONSILLECTOMY      Current Outpatient Rx  . Order #: [id number] Class: Historical Med  . Order #: [id number] Class: Historical Med  . Order #: [id number] Class: Normal  . Order #: [id number] Class: Print  . Order #: [id number] Class: Historical Med  . Order #: [id number] Class: Historical Med  . Order #: [id number] Class: Historical Med  . Order #: [id number] Class: Print  . Order #: [id number] Class: Historical Med    Allergies Review of patient's allergies indicates no known allergies.  Family History  Problem Relation Age of Onset  . Breast cancer Paternal Aunt   . Bone cancer Paternal Uncle     Social History Social History  Substance Use Topics  . Smoking status: Former Research scientist (life sciences)  . Smokeless tobacco:  Not on file  . Alcohol use 1.2 oz/week    2 Glasses of wine per week     Comment: daily    Review of Systems Constitutional: No fever/chills Eyes: No visual changes. ENT: No sore throat. Cardiovascular: Denies chest pain. Respiratory: Denies shortness of breath. Gastrointestinal: No abdominal pain.  No nausea, no vomiting.  No diarrhea.  No constipation. Genitourinary: Negative for dysuria. Musculoskeletal: Per history of present illness Skin: Negative for rash. Neurological: Negative for headaches, focal weakness or numbness. 10-point ROS otherwise negative.  ____________________________________________   PHYSICAL EXAM:  VITAL SIGNS: ED Triage Vitals [01/25/16 1734]  Enc Vitals Group     BP (!) 168/66     Pulse Rate (!) 56     Resp 18     Temp 97.7 F (36.5 C)     Temp Source Oral     SpO2 99 %     Weight 148 lb (67.1 kg)     Height 5\' 4"  (1.626 m)     Head Circumference      Peak Flow      Pain Score 0     Pain Loc      Pain Edu?      Excl. in [place]?     Constitutional: Alert and oriented. Well appearing and in no acute distress. Eyes: Conjunctivae are normal. PERRL. EOMI. Ears:  Clear with normal landmarks. No erythema. Head: Atraumatic. Nose: No congestion/rhinnorhea. Mouth/Throat: Mucous membranes are moist.  Oropharynx non-erythematous. No lesions. Neck:  Supple.  No adenopathy.  Has mild tenderness over the paracervical spine. No cervical tenderness. Cardiovascular: Normal rate, regular rhythm. Grossly normal heart sounds.  Good peripheral circulation. Respiratory: Normal respiratory effort.  No retractions. Lungs CTAB. Gastrointestinal: Soft and nontender. No distention. No abdominal bruits. No CVA tenderness. Musculoskeletal: Nml ROM of upper and lower extremity joints. She does have tenderness to the right anterior shoulder. Negative impingement sign range of motion intact Knees, bilaterally. Range motion intact. Effusion to the right knee noted. Minimal  laxity with valgus and varus stress. Abrasions, mild to the suprapatellar region. Neurologic:  Normal speech and language. No gross focal neurologic deficits are appreciated. No gait instability. Skin:  Skin is warm, dry and intact. No rash noted. Psychiatric: Mood and affect are normal. Speech and behavior are normal.  ____________________________________________   LABS (all labs ordered are listed, but only abnormal results are displayed)  Labs Reviewed - No data to display ____________________________________________  EKG   ____________________________________________  RADIOLOGY   ____________________________________________   PROCEDURES  Procedure(s) performed: LACERATION REPAIR Performed by: Mortimer Fries Authorized by: Mortimer Fries Consent: Verbal consent obtained. Risks and benefits: risks, benefits and alternatives were discussed Consent given by: patient Patient identity confirmed: provided demographic data Prepped and Draped in normal sterile fashion Wound explored  Laceration Location: Right periorbital region.  Laceration Length: 0.4cm, 0.3  No Foreign Bodies seen or palpated  Anesthesia: local infiltration  Local anesthetic: none  Anesthetic total:   Irrigation method: syringe Amount of cleaning: standard  Skin closure: tissue adhesive  Number of sutures: none  Technique:   Patient tolerance: Patient tolerated the procedure well with no immediate complications.   Critical Care performed: No  ____________________________________________   INITIAL IMPRESSION / ASSESSMENT AND PLAN / ED COURSE  Pertinent labs & imaging results that were available during my care of the patient were reviewed by me and considered in my medical decision making (see chart for details).  81 year old female with small lacerations to the right periorbital region requiring tissue adhesive for closure. She received Steri-Strips this morning after the fall by a  Product/process development scientist. These were removed in the ED revealing very small lacerations as noted above. Instructions given in wound care. She can follow-up with her primary physician for any concerns. She has mild abrasions to the knees bilaterally. These have been treated prior to arrival and she will follow-up with any concerns. Also mild injuries to the right shoulder  and bilateral knees with stable exams. X-rays not indicated. ____________________________________________   FINAL CLINICAL IMPRESSION(S) / ED DIAGNOSES  Final diagnoses:  Facial laceration, initial encounter  Multiple abrasions  Acute pain of both knees  Acute pain of right shoulder      Mortimer Fries, PA-C 01/25/16 1920    Harvest Dark, MD 01/26/16 0002

## 2016-01-25 NOTE — ED Triage Notes (Signed)
Patient arrives to ED via POV after she tripped while walking on the sidewalk. Patient fell on knees and shoulder. Patient was wearing glasses. Laceration noted to right eye brow. Patient denies being on blood thinners. Patient A&O x4. Ambulatory.

## 2016-01-25 NOTE — Discharge Instructions (Signed)
Watch for signs of infection. The glue should begin to soften over the next couple weeks. Follow-up with Dr. Sabra Heck for any concerns.

## 2016-01-30 ENCOUNTER — Ambulatory Visit: Payer: Self-pay | Admitting: Orthopedic Surgery

## 2016-02-04 ENCOUNTER — Encounter (HOSPITAL_COMMUNITY)
Admission: RE | Admit: 2016-02-04 | Discharge: 2016-02-04 | Disposition: A | Discharge status: Home / Self Care | Payer: Medicare Other | Source: Ambulatory Visit | Attending: Orthopedic Surgery | Admitting: Orthopedic Surgery

## 2016-02-04 ENCOUNTER — Encounter (HOSPITAL_COMMUNITY): Payer: Self-pay

## 2016-02-04 DIAGNOSIS — Z01812 Encounter for preprocedural laboratory examination: Secondary | ICD-10-CM | POA: Insufficient documentation

## 2016-02-04 DIAGNOSIS — M1711 Unilateral primary osteoarthritis, right knee: Secondary | ICD-10-CM | POA: Diagnosis not present

## 2016-02-04 HISTORY — DX: Unspecified osteoarthritis, unspecified site: M19.90

## 2016-02-04 HISTORY — DX: Unspecified fall, initial encounter: W19.XXXA

## 2016-02-04 HISTORY — DX: Other seasonal allergic rhinitis: J30.2

## 2016-02-04 LAB — COMPREHENSIVE METABOLIC PANEL
ALBUMIN: 4.6 g/dL (ref 3.5–5.0)
ALT: 21 U/L (ref 14–54)
AST: 18 U/L (ref 15–41)
Alkaline Phosphatase: 60 U/L (ref 38–126)
Anion gap: 8 (ref 5–15)
BILIRUBIN TOTAL: 0.7 mg/dL (ref 0.3–1.2)
BUN: 19 mg/dL (ref 6–20)
CO2: 26 mmol/L (ref 22–32)
Calcium: 9.8 mg/dL (ref 8.9–10.3)
Chloride: 104 mmol/L (ref 101–111)
Creatinine, Ser: 0.83 mg/dL (ref 0.44–1.00)
GFR calc Af Amer: >60 mL/min (ref >60)
GFR calc non Af Amer: >60 mL/min (ref >60)
GLUCOSE: 97 mg/dL (ref 65–99)
POTASSIUM: 4.6 mmol/L (ref 3.5–5.1)
Sodium: 138 mmol/L (ref 135–145)
TOTAL PROTEIN: 7.8 g/dL (ref 6.5–8.1)

## 2016-02-04 LAB — SURGICAL PCR SCREEN
MRSA, PCR: NEGATIVE
Staphylococcus aureus: NEGATIVE

## 2016-02-04 LAB — URINALYSIS, ROUTINE W REFLEX MICROSCOPIC
BILIRUBIN URINE: NEGATIVE
Glucose, UA: NEGATIVE mg/dL
Hgb urine dipstick: NEGATIVE
KETONES UR: NEGATIVE mg/dL
Leukocytes, UA: NEGATIVE
NITRITE: NEGATIVE
PROTEIN: NEGATIVE mg/dL
Specific Gravity, Urine: 1.009 (ref 1.005–1.030)
pH: 6 (ref 5.0–8.0)

## 2016-02-04 LAB — CBC
HEMATOCRIT: 37.1 % (ref 36.0–46.0)
HEMOGLOBIN: 12.6 g/dL (ref 12.0–15.0)
MCH: 32.5 pg (ref 26.0–34.0)
MCHC: 34 g/dL (ref 30.0–36.0)
MCV: 95.6 fL (ref 78.0–100.0)
Platelets: 240 10*3/uL (ref 150–400)
RBC: 3.88 MIL/uL (ref 3.87–5.11)
RDW: 12.5 % (ref 11.5–15.5)
WBC: 3.6 10*3/uL — AB (ref 4.0–10.5)

## 2016-02-04 LAB — PROTIME-INR
INR: 0.99
Prothrombin Time: 13.1 seconds (ref 11.4–15.2)

## 2016-02-04 LAB — ABO/RH: ABO/RH(D): A NEG

## 2016-02-04 LAB — APTT: APTT: 25 s (ref 24–36)

## 2016-02-04 NOTE — Patient Instructions (Addendum)
Kiara [name]  02/04/2016   Your procedure is scheduled on: 02-12-16  Report to [facility] Main  Entrance take [facility]  elevators to 3rd floor to  [place] at  [place]  AM.  Call this number if you have problems the morning of surgery [phone]   Remember: ONLY 1 PERSON MAY GO WITH YOU TO SHORT STAY TO GET  READY MORNING OF [place].  Do not eat food or drink liquids :After Midnight.     Take these medicines the morning of surgery with A SIP OF WATER: Tylenol, Loratadine -if need..  DO NOT TAKE ANY DIABETIC MEDICATIONS DAY OF YOUR SURGERY                               You may not have any metal on your body including hair pins and              piercings  Do not wear jewelry, make-up, lotions, powders or perfumes, deodorant             Do not wear nail polish.  Do not shave  48 hours prior to surgery.              Men may shave face and neck.   Do not bring valuables to the hospital. [place].  Contacts, dentures or bridgework may not be worn into surgery.  Leave suitcase in the car. After surgery it may be brought to your room.     Patients discharged the day of surgery will not be allowed to drive home.  Name and phone number of your driver: Kiara [name] [phone] h/ [phone] cell  Special Instructions: N/A              Please read over the following fact sheets you were given: _____________________________________________________________________             [facility] - Preparing for Surgery Before surgery, you can play an important role.  Because skin is not sterile, your skin needs to be as free of germs as possible.  You can reduce the number of germs on your skin by washing with CHG (chlorahexidine gluconate) soap before surgery.  CHG is an antiseptic cleaner which kills germs and bonds with the skin to continue killing germs even after washing. Please DO NOT use if you have  an allergy to CHG or antibacterial soaps.  If your skin becomes reddened/irritated stop using the CHG and inform your nurse when you arrive at Short Stay. Do not shave (including legs and underarms) for at least 48 hours prior to the first CHG shower.  You may shave your face/neck. Please follow these instructions carefully:  1.  Shower with CHG Soap the night before surgery and the  morning of Surgery.  2.  If you choose to wash your hair, wash your hair first as usual with your  normal  shampoo.  3.  After you shampoo, rinse your hair and body thoroughly to remove the  shampoo.                           4.  Use CHG as you would any other liquid soap.  You can  apply chg directly  to the skin and wash                       Gently with a scrungie or clean washcloth.  5.  Apply the CHG Soap to your body ONLY FROM THE NECK DOWN.   Do not use on face/ open                           Wound or open sores. Avoid contact with eyes, ears mouth and genitals (private parts).                       Wash face,  Genitals (private parts) with your normal soap.             6.  Wash thoroughly, paying special attention to the area where your surgery  will be performed.  7.  Thoroughly rinse your body with warm water from the neck down.  8.  DO NOT shower/wash with your normal soap after using and rinsing off  the CHG Soap.                9.  Pat yourself dry with a clean towel.            10.  Wear clean pajamas.            11.  Place clean sheets on your bed the night of your first shower and do not  sleep with pets. Day of Surgery : Do not apply any lotions/deodorants the morning of surgery.  Please wear clean clothes to the hospital/surgery center.  FAILURE TO FOLLOW THESE INSTRUCTIONS MAY RESULT IN THE CANCELLATION OF YOUR SURGERY PATIENT SIGNATURE_________________________________  NURSE  SIGNATURE__________________________________  ________________________________________________________________________   Adam Phenix  An incentive spirometer is a tool that can help keep your lungs clear and active. This tool measures how well you are filling your lungs with each breath. Taking long deep breaths may help reverse or decrease the chance of developing breathing (pulmonary) problems (especially infection) following:  A long period of time when you are unable to move or be active. BEFORE THE PROCEDURE   If the spirometer includes an indicator to show your best effort, your nurse or respiratory therapist will set it to a desired goal.  If possible, sit up straight or lean slightly forward. Try not to slouch.  Hold the incentive spirometer in an upright position. INSTRUCTIONS FOR USE  1. Sit on the edge of your bed if possible, or sit up as far as you can in bed or on a chair. 2. Hold the incentive spirometer in an upright position. 3. Breathe out normally. 4. Place the mouthpiece in your mouth and seal your lips tightly around it. 5. Breathe in slowly and as deeply as possible, raising the piston or the ball toward the top of the column. 6. Hold your breath for 3-5 seconds or for as long as possible. Allow the piston or ball to fall to the bottom of the column. 7. Remove the mouthpiece from your mouth and breathe out normally. 8. Rest for a few seconds and repeat Steps 1 through 7 at least 10 times every 1-2 hours when you are awake. Take your time and take a few normal breaths between deep breaths. 9. The spirometer may include an indicator to show your best effort. Use the indicator as a goal to work toward during each  repetition. 10. After each set of 10 deep breaths, practice coughing to be sure your lungs are clear. If you have an incision (the cut made at the time of surgery), support your incision when coughing by placing a pillow or rolled up towels firmly  against it. Once you are able to get out of bed, walk around indoors and cough well. You may stop using the incentive spirometer when instructed by your caregiver.  RISKS AND COMPLICATIONS  Take your time so you do not get dizzy or light-headed.  If you are in pain, you may need to take or ask for pain medication before doing incentive spirometry. It is harder to take a deep breath if you are having pain. AFTER USE  Rest and breathe slowly and easily.  It can be helpful to keep track of a log of your progress. Your caregiver can provide you with a simple table to help with this. If you are using the spirometer at home, follow these instructions: [place] IF:   You are having difficultly using the spirometer.  You have trouble using the spirometer as often as instructed.  Your pain medication is not giving enough relief while using the spirometer.  You develop fever of 100.5 F (38.1 C) or higher. SEEK IMMEDIATE MEDICAL CARE IF:   You cough up bloody sputum that had not been present before.  You develop fever of 102 F (38.9 C) or greater.  You develop worsening pain at or near the incision site. MAKE SURE YOU:   Understand these instructions.  Will watch your condition.  Will get help right away if you are not doing well or get worse. Document Released: 07/27/2006 Document Revised: 06/08/2011 Document Reviewed: 09/27/2006 ExitCare Patient Information 2014 ExitCare, [place].   ________________________________________________________________________  WHAT IS A BLOOD TRANSFUSION? Blood Transfusion Information  A transfusion is the replacement of blood or some of its parts. Blood is made up of multiple cells which provide different functions.  Red blood cells carry oxygen and are used for blood loss replacement.  White blood cells fight against infection.  Platelets control bleeding.  Plasma helps clot blood.  Other blood products are available for  specialized needs, such as hemophilia or other clotting disorders. BEFORE THE TRANSFUSION  Who gives blood for transfusions?   Healthy volunteers who are fully evaluated to make sure their blood is safe. This is blood bank blood. Transfusion therapy is the safest it has ever been in the practice of medicine. Before blood is taken from a donor, a complete history is taken to make sure that person has no history of diseases nor engages in risky social behavior (examples are intravenous drug use or sexual activity with multiple partners). The donor's travel history is screened to minimize risk of transmitting infections, such as malaria. The donated blood is tested for signs of infectious diseases, such as HIV and hepatitis. The blood is then tested to be sure it is compatible with you in order to minimize the chance of a transfusion reaction. If you or a relative donates blood, this is often done in anticipation of surgery and is not appropriate for emergency situations. It takes many days to process the donated blood. RISKS AND COMPLICATIONS Although transfusion therapy is very safe and saves many lives, the main dangers of transfusion include:   Getting an infectious disease.  Developing a transfusion reaction. This is an allergic reaction to something in the blood you were given. Every precaution is taken to prevent this.  The decision to have a blood transfusion has been considered carefully by your caregiver before blood is given. Blood is not given unless the benefits outweigh the risks. AFTER THE TRANSFUSION  Right after receiving a blood transfusion, you will usually feel much better and more energetic. This is especially true if your red blood cells have gotten low (anemic). The transfusion raises the level of the red blood cells which carry oxygen, and this usually causes an energy increase.  The nurse administering the transfusion will monitor you carefully for complications. HOME CARE  INSTRUCTIONS  No special instructions are needed after a transfusion. You may find your energy is better. Speak with your caregiver about any limitations on activity for underlying diseases you may have. SEEK MEDICAL CARE IF:   Your condition is not improving after your transfusion.  You develop redness or irritation at the intravenous (IV) site. SEEK IMMEDIATE MEDICAL CARE IF:  Any of the following symptoms occur over the next 12 hours:  Shaking chills.  You have a temperature by mouth above 102 F (38.9 C), not controlled by medicine.  Chest, back, or muscle pain.  People around you feel you are not acting correctly or are confused.  Shortness of breath or difficulty breathing.  Dizziness and fainting.  You get a rash or develop hives.  You have a decrease in urine output.  Your urine turns a dark color or changes to pink, red, or brown. Any of the following symptoms occur over the next 10 days:  You have a temperature by mouth above 102 F (38.9 C), not controlled by medicine.  Shortness of breath.  Weakness after normal activity.  The white part of the eye turns yellow (jaundice).  You have a decrease in the amount of urine or are urinating less often.  Your urine turns a dark color or changes to pink, red, or brown. Document Released: 03/13/2000 Document Revised: 06/08/2011 Document Reviewed: 10/31/2007 [facility] Patient Information 2014 [place], [place].  _______________________________________________________________________

## 2016-02-04 NOTE — Pre-Procedure Instructions (Signed)
Clearance note (Dr. Sabra Heck) with chart.

## 2016-02-06 ENCOUNTER — Ambulatory Visit
Admission: RE | Admit: 2016-02-06 | Discharge: 2016-02-06 | Disposition: A | Discharge status: Home / Self Care | Payer: Medicare Other | Source: Ambulatory Visit | Attending: Internal Medicine | Admitting: Internal Medicine

## 2016-02-06 DIAGNOSIS — Z1231 Encounter for screening mammogram for malignant neoplasm of breast: Secondary | ICD-10-CM | POA: Diagnosis present

## 2016-02-11 ENCOUNTER — Ambulatory Visit: Payer: Self-pay | Admitting: Orthopedic Surgery

## 2016-02-11 NOTE — H&P (Signed)
Kiara [name] DOB: 07/13/1934 Married / Language: English / Race: White Female Date of Admission:  02/12/2016 CC:  Right Knee Pain History of Present Illness The patient is a 81 year old female who comes in for a preoperative History and Physical. The patient is scheduled for a right total knee arthroplasty to be performed by Dr. Dione Plover. Aluisio, MD at [facility] on 02-12-2016. The patient is a 81 year old female who presented with knee complaints. The patient was see for a second opinion. The patient reports right knee symptoms including: pain, swelling, soreness and grinding which began 2 year(s) ago without any known injury (She does report a fall about three years ago, where she landed on her knee on a concrete garage floor. Her knee hurt right after the injury, but the pain resolved on its own). The patient describes their pain as aching.The patient feels that the symptoms are worsening. The patient has the current diagnosis of knee osteoarthritis. Previous work-up for this problem has included arthroscopy (11/26/14). Past treatment for this problem has included oral corticosteroids and intra-articular injection of corticosteroids. The most recent cortisone injection was on 11/06/15. She also completed a series of Synvisc on 09/10/15. Symptoms are exacerbated by walking (especially down an incline or steps). Current treatment includes restricted activity (She states that she does not have a lot of pain, unless she is more active. She does do water aerobics 1-2 times per week, as well as using the recumbant every other day. However, she recently just got back from vacation and has not done much exercising since she returned home). Unfortunately, her right knee has gotten progressively worse over time. She has been treated in [facility] by Dr. Marry Guan and Vance Peper. She had the procedures and injection as mentioned above. Unfortunately, injections are no longer beneficial. She has had  swelling in the knee. With the injection, she did not have aspirations. She feels as though the knee has taken over her life. She would like to be more active but the knee is preventing her from doing so. She would like to proceed with surgery. They have been treated conservatively in the past for the above stated problem and despite conservative measures, they continue to have progressive pain and severe functional limitations and dysfunction. They have failed non-operative management including home exercise, medications, and injections. It is felt that they would benefit from undergoing total joint replacement. Risks and benefits of the procedure have been discussed with the patient and they elect to proceed with surgery. There are no active contraindications to surgery such as ongoing infection or rapidly progressive neurological disease.  Problem List/Past Medical  Contusion of knee, right (S80.01XA)  Primary osteoarthritis of right knee (M17.11)  Diverticulitis Of Colon  Cervical pain (M54.2)  Thoracic spine pain (M54.6)  Shoulder pain (M25.519)  History of C. Diff  Allergies  No Known Drug Allergies  Family History  Osteoporosis  Mother. Congestive Heart Failure  Father. Hypertension  Father, First Degree Relatives.  Social History Tobacco use  Former smoker. 02/08/2013 Number of flights of stairs before winded  4-5 Living situation  live with spouse Current drinker  02/08/2013: Currently drinks beer, wine and hard liquor 5-7 times per week Children  3 No history of drug/alcohol rehab  Marital status  married Not under pain contract  Exercise  Exercises weekly; does running / walking and other Current work status  retired  Medication History Probiotic Acidophilus (Oral) Active. Advil (200MG  Tablet, Oral as needed) Active.  Osteo Bi-Flex Joint Shield (Oral) Active.  Past Surgical History Tonsillectomy  Hemorrhoidectomy  Appendectomy  Cataract  Surgery  bilateral Mole Excision from Chest Wall   Review of Systems General Not Present- Chills, Fatigue, Fever, Memory Loss, Night Sweats, Weight Gain and Weight Loss. Skin Not Present- Eczema, Hives, Itching, Lesions and Rash. HEENT Not Present- Dentures, Double Vision, Headache, Hearing Loss, Tinnitus and Visual Loss. Respiratory Not Present- Allergies, Chronic Cough, Coughing up blood, Shortness of breath at rest and Shortness of breath with exertion. Cardiovascular Not Present- Chest Pain, Difficulty Breathing Lying Down, Murmur, Palpitations, Racing/skipping heartbeats and Swelling. Gastrointestinal Present- Constipation. Not Present- Abdominal Pain, Bloody Stool, Diarrhea, Difficulty Swallowing, Heartburn, Jaundice, Loss of appetitie, Nausea and Vomiting. Female Genitourinary Not Present- Blood in Urine, Discharge, Flank Pain, Incontinence, Painful Urination, Urgency, Urinary frequency, Urinary Retention, Urinating at Night and Weak urinary stream. Musculoskeletal Present- Joint Pain. Not Present- Back Pain, Joint Swelling, Morning Stiffness, Muscle Pain, Muscle Weakness and Spasms. Neurological Not Present- Blackout spells, Difficulty with balance, Dizziness, Paralysis, Tremor and Weakness. Psychiatric Not Present- Insomnia.  Vitals  Weight: 147 lb Height: 64in Weight was reported by patient. Height was reported by patient. Body Surface Area: 1.72 m Body Mass Index: 25.23 kg/m  Pulse: 80 (Regular)  BP: 138/72 (Sitting, Right Arm, Standard)  Physical Exam  General Mental Status -Alert, cooperative and good historian. General Appearance-pleasant, Not in acute distress. Orientation-Oriented X3. Build & Nutrition-Well nourished and Well developed.  Head and Neck Head-normocephalic, atraumatic . Neck Global Assessment - supple, no bruit auscultated on the right, no bruit auscultated on the left.  Eye Vision-Wears corrective lenses. Pupil -  Bilateral-Regular and Round. Note: bruise on outside of right eye due to recent fall Motion - Bilateral-EOMI.  Chest and Lung Exam Auscultation Breath sounds - clear at anterior chest wall and clear at posterior chest wall. Adventitious sounds - No Adventitious sounds.  Cardiovascular Auscultation Rhythm - Regular rate and rhythm. Heart Sounds - S1 WNL and S2 WNL. Murmurs & Other Heart Sounds - Auscultation of the heart reveals - No Murmurs.  Abdomen Palpation/Percussion Tenderness - Abdomen is non-tender to palpation. Rigidity (guarding) - Abdomen is soft. Auscultation Auscultation of the abdomen reveals - Bowel sounds normal.  Female Genitourinary Note: Not done, not pertinent to present illness   Musculoskeletal Note: On exam, she is alert and oriented, in no apparent distress. Her hips show normal range of motion with no discomfort. Her left knee shows no effusion. Range 0 to 135. There is moderate crepitus on range of motion of the left knee. No tenderness or instability. Right knee moderate effusion, range 5 to 125. She has moderate crepitus on range of motion. She has lateral greater than medial joint line tenderness. There is no instability noted about the knee. Gait pattern is significantly antalgic on the right.  RADIOGRAPHS Her radiographs taken today; AP both knees and lateral of the right demonstrate that she has bone-on-bone arthritis, lateral and patellofemoral compartments. She does not have any significant deformity.   Assessment & Plan Primary osteoarthritis of right knee (M17.11)  Note:Surgical Plans: Right Total Knee Replacement  Disposition: Home  PCP: Dr. Emily Filbert - Patient has been seen preoperatively and felt to be stable for surgery. "Low risk".  IV TXA  Anesthesia Issues: None  Signed electronically by Ok Edwards, III PA-C

## 2016-02-12 ENCOUNTER — Encounter (HOSPITAL_COMMUNITY)
Admission: RE | Disposition: A | Discharge status: Home Health | Payer: Self-pay | Source: Ambulatory Visit | Attending: Orthopedic Surgery

## 2016-02-12 ENCOUNTER — Encounter (HOSPITAL_COMMUNITY): Payer: Self-pay | Admitting: *Deleted

## 2016-02-12 ENCOUNTER — Inpatient Hospital Stay (HOSPITAL_COMMUNITY): Payer: Medicare Other | Admitting: Anesthesiology

## 2016-02-12 ENCOUNTER — Inpatient Hospital Stay (HOSPITAL_COMMUNITY)
Admission: RE | Admit: 2016-02-12 | Discharge: 2016-02-14 | DRG: 470 | Disposition: A | Discharge status: Home Health | Payer: Medicare Other | Source: Ambulatory Visit | Attending: Orthopedic Surgery | Admitting: Orthopedic Surgery

## 2016-02-12 DIAGNOSIS — J302 Other seasonal allergic rhinitis: Secondary | ICD-10-CM | POA: Diagnosis present

## 2016-02-12 DIAGNOSIS — I2510 Atherosclerotic heart disease of native coronary artery without angina pectoris: Secondary | ICD-10-CM | POA: Diagnosis present

## 2016-02-12 DIAGNOSIS — M1710 Unilateral primary osteoarthritis, unspecified knee: Secondary | ICD-10-CM

## 2016-02-12 DIAGNOSIS — K219 Gastro-esophageal reflux disease without esophagitis: Secondary | ICD-10-CM | POA: Diagnosis present

## 2016-02-12 DIAGNOSIS — M1711 Unilateral primary osteoarthritis, right knee: Principal | ICD-10-CM | POA: Diagnosis present

## 2016-02-12 DIAGNOSIS — M179 Osteoarthritis of knee, unspecified: Secondary | ICD-10-CM | POA: Diagnosis present

## 2016-02-12 HISTORY — PX: TOTAL KNEE ARTHROPLASTY: SHX125

## 2016-02-12 LAB — TYPE AND SCREEN
ABO/RH(D): A NEG
ANTIBODY SCREEN: NEGATIVE

## 2016-02-12 SURGERY — ARTHROPLASTY, KNEE, TOTAL
Anesthesia: Spinal | Site: Knee | Laterality: Right

## 2016-02-12 MED ORDER — METOCLOPRAMIDE HCL 5 MG PO TABS: 5.0000 mg | ORAL_TABLET | Freq: Three times a day (TID) | ORAL | Status: DC | PRN

## 2016-02-12 MED ORDER — SODIUM CHLORIDE 0.9 % IV SOLN
INTRAVENOUS | Status: DC
  Administered 2016-02-12: 17:00:00 via INTRAVENOUS

## 2016-02-12 MED ORDER — BUPIVACAINE LIPOSOME 1.3 % IJ SUSP
INTRAMUSCULAR | Status: DC | PRN
  Administered 2016-02-12: 20 mL

## 2016-02-12 MED ORDER — CEFAZOLIN SODIUM-DEXTROSE 2-4 GM/100ML-% IV SOLN
INTRAVENOUS | Status: AC
  Filled 2016-02-12: qty 100

## 2016-02-12 MED ORDER — SODIUM CHLORIDE 0.9 % IJ SOLN
INTRAMUSCULAR | Status: DC | PRN
  Administered 2016-02-12: 30 mL

## 2016-02-12 MED ORDER — DOCUSATE SODIUM 100 MG PO CAPS
100.0000 mg | ORAL_CAPSULE | Freq: Two times a day (BID) | ORAL | Status: DC
  Administered 2016-02-12 – 2016-02-13 (×3): 100 mg via ORAL
  Filled 2016-02-12 (×3): qty 1

## 2016-02-12 MED ORDER — DEXAMETHASONE SODIUM PHOSPHATE 10 MG/ML IJ SOLN
10.0000 mg | Freq: Once | INTRAMUSCULAR | Status: AC
  Administered 2016-02-13: 10 mg via INTRAVENOUS
  Filled 2016-02-12: qty 1

## 2016-02-12 MED ORDER — LACTATED RINGERS IV SOLN
INTRAVENOUS | Status: DC
  Administered 2016-02-12 (×2): via INTRAVENOUS

## 2016-02-12 MED ORDER — SODIUM CHLORIDE 0.9 % IR SOLN
Status: DC | PRN
  Administered 2016-02-12: 1000 mL

## 2016-02-12 MED ORDER — LIDOCAINE 2% (20 MG/ML) 5 ML SYRINGE
INTRAMUSCULAR | Status: DC | PRN
  Administered 2016-02-12: 30 mg via INTRAVENOUS

## 2016-02-12 MED ORDER — LIDOCAINE 2% (20 MG/ML) 5 ML SYRINGE
INTRAMUSCULAR | Status: AC
  Filled 2016-02-12: qty 5

## 2016-02-12 MED ORDER — METHOCARBAMOL 500 MG PO TABS
500.0000 mg | ORAL_TABLET | Freq: Four times a day (QID) | ORAL | Status: DC | PRN
  Administered 2016-02-13: 500 mg via ORAL
  Filled 2016-02-12: qty 1

## 2016-02-12 MED ORDER — ONDANSETRON HCL 4 MG/2ML IJ SOLN
INTRAMUSCULAR | Status: DC | PRN
  Administered 2016-02-12: 4 mg via INTRAVENOUS

## 2016-02-12 MED ORDER — ACETAMINOPHEN 10 MG/ML IV SOLN
1000.0000 mg | Freq: Once | INTRAVENOUS | Status: AC
  Administered 2016-02-12: 1000 mg via INTRAVENOUS

## 2016-02-12 MED ORDER — TRANEXAMIC ACID 1000 MG/10ML IV SOLN
1000.0000 mg | INTRAVENOUS | Status: AC
  Administered 2016-02-12: 1000 mg via INTRAVENOUS
  Filled 2016-02-12: qty 1100

## 2016-02-12 MED ORDER — ONDANSETRON HCL 4 MG PO TABS: 4.0000 mg | ORAL_TABLET | Freq: Four times a day (QID) | ORAL | Status: DC | PRN

## 2016-02-12 MED ORDER — BUPIVACAINE HCL (PF) 0.25 % IJ SOLN
INTRAMUSCULAR | Status: AC
Start: 2016-02-12 — End: 2016-02-12
  Filled 2016-02-12: qty 30

## 2016-02-12 MED ORDER — PROPOFOL 10 MG/ML IV BOLUS
INTRAVENOUS | Status: AC
  Filled 2016-02-12: qty 60

## 2016-02-12 MED ORDER — BUPIVACAINE LIPOSOME 1.3 % IJ SUSP
20.0000 mL | Freq: Once | INTRAMUSCULAR | Status: DC
  Filled 2016-02-12: qty 20

## 2016-02-12 MED ORDER — BUPIVACAINE IN DEXTROSE 0.75-8.25 % IT SOLN
INTRATHECAL | Status: DC | PRN
  Administered 2016-02-12: 2 mL via INTRATHECAL

## 2016-02-12 MED ORDER — BUPIVACAINE HCL 0.25 % IJ SOLN
INTRAMUSCULAR | Status: DC | PRN
  Administered 2016-02-12: 20 mL

## 2016-02-12 MED ORDER — FLEET ENEMA 7-19 GM/118ML RE ENEM: 1.0000 | ENEMA | Freq: Once | RECTAL | Status: DC | PRN

## 2016-02-12 MED ORDER — DEXAMETHASONE SODIUM PHOSPHATE 10 MG/ML IJ SOLN
INTRAMUSCULAR | Status: AC
  Filled 2016-02-12: qty 1

## 2016-02-12 MED ORDER — PROMETHAZINE HCL 25 MG/ML IJ SOLN: 6.2500 mg | INTRAMUSCULAR | Status: DC | PRN

## 2016-02-12 MED ORDER — MEPERIDINE HCL 50 MG/ML IJ SOLN: 6.2500 mg | INTRAMUSCULAR | Status: DC | PRN

## 2016-02-12 MED ORDER — TRAMADOL HCL 50 MG PO TABS
50.0000 mg | ORAL_TABLET | Freq: Four times a day (QID) | ORAL | Status: DC | PRN
  Administered 2016-02-12 – 2016-02-13 (×5): 100 mg via ORAL
  Filled 2016-02-12 (×5): qty 2

## 2016-02-12 MED ORDER — MENTHOL 3 MG MT LOZG: 1.0000 | LOZENGE | OROMUCOSAL | Status: DC | PRN

## 2016-02-12 MED ORDER — PROPOFOL 500 MG/50ML IV EMUL: INTRAVENOUS | Status: DC | PRN

## 2016-02-12 MED ORDER — METOCLOPRAMIDE HCL 5 MG/ML IJ SOLN: 5.0000 mg | Freq: Three times a day (TID) | INTRAMUSCULAR | Status: DC | PRN

## 2016-02-12 MED ORDER — DEXAMETHASONE SODIUM PHOSPHATE 10 MG/ML IJ SOLN
10.0000 mg | Freq: Once | INTRAMUSCULAR | Status: AC
  Administered 2016-02-12: 10 mg via INTRAVENOUS

## 2016-02-12 MED ORDER — ONDANSETRON HCL 4 MG/2ML IJ SOLN: 4.0000 mg | Freq: Four times a day (QID) | INTRAMUSCULAR | Status: DC | PRN

## 2016-02-12 MED ORDER — DIPHENHYDRAMINE HCL 12.5 MG/5ML PO ELIX: 12.5000 mg | ORAL_SOLUTION | ORAL | Status: DC | PRN

## 2016-02-12 MED ORDER — LORATADINE 10 MG PO TABS: 10.0000 mg | ORAL_TABLET | Freq: Every day | ORAL | Status: DC | PRN

## 2016-02-12 MED ORDER — BISACODYL 10 MG RE SUPP: 10.0000 mg | Freq: Every day | RECTAL | Status: DC | PRN

## 2016-02-12 MED ORDER — PROPOFOL 10 MG/ML IV BOLUS
INTRAVENOUS | Status: DC | PRN
  Administered 2016-02-12: 30 mg via INTRAVENOUS
  Administered 2016-02-12 (×3): 10 mg via INTRAVENOUS

## 2016-02-12 MED ORDER — RIVAROXABAN 10 MG PO TABS
10.0000 mg | ORAL_TABLET | Freq: Every day | ORAL | Status: DC
  Administered 2016-02-13 – 2016-02-14 (×2): 10 mg via ORAL
  Filled 2016-02-12 (×2): qty 1

## 2016-02-12 MED ORDER — HYDROMORPHONE HCL 1 MG/ML IJ SOLN: 0.2500 mg | INTRAMUSCULAR | Status: DC | PRN

## 2016-02-12 MED ORDER — SODIUM CHLORIDE 0.9 % IJ SOLN
INTRAMUSCULAR | Status: AC
Start: 2016-02-12 — End: 2016-02-12
  Filled 2016-02-12: qty 50

## 2016-02-12 MED ORDER — OXYCODONE HCL 5 MG PO TABS
5.0000 mg | ORAL_TABLET | ORAL | Status: DC | PRN
  Administered 2016-02-13: 10 mg via ORAL
  Administered 2016-02-13 (×2): 5 mg via ORAL
  Administered 2016-02-13: 10 mg via ORAL
  Administered 2016-02-13 (×2): 5 mg via ORAL
  Administered 2016-02-14 (×3): 10 mg via ORAL
  Filled 2016-02-12 (×2): qty 1
  Filled 2016-02-12: qty 2
  Filled 2016-02-12: qty 1
  Filled 2016-02-12 (×3): qty 2
  Filled 2016-02-12: qty 1
  Filled 2016-02-12: qty 2
  Filled 2016-02-12: qty 1

## 2016-02-12 MED ORDER — ONDANSETRON HCL 4 MG/2ML IJ SOLN
INTRAMUSCULAR | Status: AC
  Filled 2016-02-12: qty 2

## 2016-02-12 MED ORDER — ACETAMINOPHEN 650 MG RE SUPP: 650.0000 mg | Freq: Four times a day (QID) | RECTAL | Status: DC | PRN

## 2016-02-12 MED ORDER — 0.9 % SODIUM CHLORIDE (POUR BTL) OPTIME
TOPICAL | Status: DC | PRN
  Administered 2016-02-12: 1000 mL

## 2016-02-12 MED ORDER — ACETAMINOPHEN 325 MG PO TABS
650.0000 mg | ORAL_TABLET | Freq: Four times a day (QID) | ORAL | Status: DC | PRN
Start: 2016-02-13 — End: 2016-02-14

## 2016-02-12 MED ORDER — CEFAZOLIN IN D5W 1 GM/50ML IV SOLN
1.0000 g | Freq: Four times a day (QID) | INTRAVENOUS | Status: AC
  Administered 2016-02-12 – 2016-02-13 (×2): 1 g via INTRAVENOUS
  Filled 2016-02-12 (×2): qty 50

## 2016-02-12 MED ORDER — CHLORHEXIDINE GLUCONATE 4 % EX LIQD: 60.0000 mL | Freq: Once | CUTANEOUS | Status: DC

## 2016-02-12 MED ORDER — ACETAMINOPHEN 500 MG PO TABS
1000.0000 mg | ORAL_TABLET | Freq: Four times a day (QID) | ORAL | Status: AC
  Administered 2016-02-12 – 2016-02-13 (×4): 1000 mg via ORAL
  Filled 2016-02-12 (×4): qty 2

## 2016-02-12 MED ORDER — CEFAZOLIN SODIUM-DEXTROSE 2-4 GM/100ML-% IV SOLN
2.0000 g | INTRAVENOUS | Status: AC
  Administered 2016-02-12: 2 g via INTRAVENOUS
  Filled 2016-02-12: qty 100

## 2016-02-12 MED ORDER — PROPOFOL 500 MG/50ML IV EMUL
INTRAVENOUS | Status: DC | PRN
  Administered 2016-02-12: 85 ug/kg/min via INTRAVENOUS

## 2016-02-12 MED ORDER — TRANEXAMIC ACID 1000 MG/10ML IV SOLN
1000.0000 mg | Freq: Once | INTRAVENOUS | Status: AC
  Administered 2016-02-12: 1000 mg via INTRAVENOUS
  Filled 2016-02-12: qty 1100

## 2016-02-12 MED ORDER — HYDROMORPHONE HCL 1 MG/ML IJ SOLN: 0.5000 mg | INTRAMUSCULAR | Status: DC | PRN

## 2016-02-12 MED ORDER — PHENOL 1.4 % MT LIQD
1.0000 | OROMUCOSAL | Status: DC | PRN
  Filled 2016-02-12: qty 177

## 2016-02-12 MED ORDER — POLYETHYLENE GLYCOL 3350 17 G PO PACK: 17.0000 g | PACK | Freq: Every day | ORAL | Status: DC | PRN

## 2016-02-12 MED ORDER — METHOCARBAMOL 1000 MG/10ML IJ SOLN
500.0000 mg | Freq: Four times a day (QID) | INTRAVENOUS | Status: DC | PRN
  Administered 2016-02-12: 500 mg via INTRAVENOUS
  Filled 2016-02-12: qty 550
  Filled 2016-02-12: qty 5

## 2016-02-12 MED ORDER — ACETAMINOPHEN 10 MG/ML IV SOLN
INTRAVENOUS | Status: AC
  Filled 2016-02-12: qty 100

## 2016-02-12 SURGICAL SUPPLY — 47 items
BAG ZIPLOCK 12X15 (MISCELLANEOUS) IMPLANT
BANDAGE ACE 6X5 VEL STRL LF (GAUZE/BANDAGES/DRESSINGS) ×2 IMPLANT
BLADE SAG 18X100X1.27 (BLADE) ×2 IMPLANT
BLADE SAW SGTL 11.0X1.19X90.0M (BLADE) ×2 IMPLANT
BOWL SMART MIX CTS (DISPOSABLE) ×2 IMPLANT
CAPT KNEE TOTAL 3 ATTUNE ×2 IMPLANT
CEMENT HV SMART SET (Cement) ×4 IMPLANT
CLOTH BEACON ORANGE TIMEOUT ST (SAFETY) ×2 IMPLANT
CUFF TOURN SGL QUICK 34 (TOURNIQUET CUFF) ×1
CUFF TRNQT CYL 34X4X40X1 (TOURNIQUET CUFF) ×1 IMPLANT
DECANTER SPIKE VIAL GLASS SM (MISCELLANEOUS) ×2 IMPLANT
DRAPE SHEET LG 3/4 BI-LAMINATE (DRAPES) ×2 IMPLANT
DRAPE U-SHAPE 47X51 STRL (DRAPES) ×2 IMPLANT
DRSG ADAPTIC 3X8 NADH LF (GAUZE/BANDAGES/DRESSINGS) ×2 IMPLANT
DRSG PAD ABDOMINAL 8X10 ST (GAUZE/BANDAGES/DRESSINGS) ×2 IMPLANT
DRSG TEGADERM 4X4.75 (GAUZE/BANDAGES/DRESSINGS) ×2 IMPLANT
DURAPREP 26ML APPLICATOR (WOUND CARE) ×2 IMPLANT
ELECT REM PT RETURN 9FT ADLT (ELECTROSURGICAL) ×2
ELECTRODE REM PT RTRN 9FT ADLT (ELECTROSURGICAL) ×1 IMPLANT
EVACUATOR 1/8 PVC DRAIN (DRAIN) ×2 IMPLANT
GAUZE SPONGE 4X4 12PLY STRL (GAUZE/BANDAGES/DRESSINGS) ×2 IMPLANT
GLOVE BIO SURGEON STRL SZ7.5 (GLOVE) ×2 IMPLANT
GLOVE BIO SURGEON STRL SZ8 (GLOVE) ×2 IMPLANT
GLOVE BIOGEL PI IND STRL 8 (GLOVE) ×2 IMPLANT
GLOVE BIOGEL PI INDICATOR 8 (GLOVE) ×2
GOWN STRL REUS W/TWL LRG LVL3 (GOWN DISPOSABLE) ×2 IMPLANT
GOWN STRL REUS W/TWL XL LVL3 (GOWN DISPOSABLE) ×2 IMPLANT
HANDPIECE INTERPULSE COAX TIP (DISPOSABLE) ×1
IMMOBILIZER KNEE 20 (SOFTGOODS) ×2
IMMOBILIZER KNEE 20 THIGH 36 (SOFTGOODS) ×1 IMPLANT
MANIFOLD NEPTUNE II (INSTRUMENTS) ×2 IMPLANT
NS IRRIG 1000ML POUR BTL (IV SOLUTION) ×2 IMPLANT
PACK TOTAL KNEE CUSTOM (KITS) ×2 IMPLANT
PADDING CAST COTTON 6X4 STRL (CAST SUPPLIES) ×4 IMPLANT
POSITIONER SURGICAL ARM (MISCELLANEOUS) ×2 IMPLANT
SET HNDPC FAN SPRY TIP SCT (DISPOSABLE) ×1 IMPLANT
STRIP CLOSURE SKIN 1/2X4 (GAUZE/BANDAGES/DRESSINGS) ×4 IMPLANT
SUT MNCRL AB 4-0 PS2 18 (SUTURE) ×2 IMPLANT
SUT VIC AB 2-0 CT1 27 (SUTURE) ×3
SUT VIC AB 2-0 CT1 TAPERPNT 27 (SUTURE) ×3 IMPLANT
SUT VLOC 180 0 24IN GS25 (SUTURE) ×2 IMPLANT
SYR 50ML LL SCALE MARK (SYRINGE) IMPLANT
TRAY FOLEY CATH 14FRSI W/METER (CATHETERS) ×2 IMPLANT
TRAY FOLEY W/METER SILVER 16FR (SET/KITS/TRAYS/PACK) IMPLANT
WATER STERILE IRR 1500ML POUR (IV SOLUTION) ×2 IMPLANT
WRAP KNEE MAXI GEL POST OP (GAUZE/BANDAGES/DRESSINGS) ×2 IMPLANT
YANKAUER SUCT BULB TIP 10FT TU (MISCELLANEOUS) ×2 IMPLANT

## 2016-02-12 NOTE — Interval H&P Note (Signed)
History and Physical Interval Note:  02/12/2016 12:25 PM  Kiara [name]  has presented today for surgery, with the diagnosis of osteoarthritis right knee  The various methods of treatment have been discussed with the patient and family. After consideration of risks, benefits and other options for treatment, the patient has consented to  Procedure(s): RIGHT TOTAL KNEE ARTHROPLASTY (Right) as a surgical intervention .  The patient's history has been reviewed, patient examined, no change in status, stable for surgery.  I have reviewed the patient's chart and labs.  Questions were answered to the patient's satisfaction.    She has a small well healed scab on the anterior aspect of her right knee without any surrounding erythema. I examined tis and feel that it will not affect our ability to operate today and will not place her at any increased risk of wound problems or infection.   Gearlean Alf

## 2016-02-12 NOTE — Progress Notes (Signed)
Dr. Lissa Hoard notified of patient's complaints of left chest pain and upper back pain - orders given.

## 2016-02-12 NOTE — Anesthesia Preprocedure Evaluation (Signed)
Anesthesia Evaluation  Patient identified by MRN, date of birth, ID band Patient awake    Reviewed: Allergy & Precautions, NPO status , Patient's Chart, lab work & pertinent test results, reviewed documented beta blocker date and time   Airway Mallampati: II  TM Distance: >3 FB     Dental  (+) Chipped   Pulmonary former smoker,    breath sounds clear to auscultation       Cardiovascular  Rhythm:Regular Rate:Normal     Neuro/Psych    GI/Hepatic GERD  ,  Endo/Other    Renal/GU      Musculoskeletal   Abdominal   Peds  Hematology   Anesthesia Other Findings   Reproductive/Obstetrics                            Anesthesia Physical  Anesthesia Plan  ASA: II  Anesthesia Plan: Spinal   Post-op Pain Management:    Induction:   Airway Management Planned:   Additional Equipment:   Intra-op Plan:   Post-operative Plan:   Informed Consent: I have reviewed the patients History and Physical, chart, labs and discussed the procedure including the risks, benefits and alternatives for the proposed anesthesia with the patient or authorized representative who has indicated his/her understanding and acceptance.   Dental advisory given  Plan Discussed with: CRNA  Anesthesia Plan Comments:         Anesthesia Quick Evaluation

## 2016-02-12 NOTE — Op Note (Signed)
OPERATIVE REPORT-TOTAL KNEE ARTHROPLASTY   Pre-operative diagnosis- Osteoarthritis  Right knee(s)  Post-operative diagnosis- Osteoarthritis Right knee(s)  Procedure-  Right  Total Knee Arthroplasty  Surgeon- Kiara Plover. Kendyn Zaman, MD  Assistant- Arlee Muslim, PA-C   Anesthesia-  Spinal  EBL-* No blood loss amount entered *   Drains Hemovac  Tourniquet time-  Total Tourniquet Time Documented: area (laterality) - 31 minutes Total: area (laterality) - 31 minutes     Complications- None  Condition-PACU - hemodynamically stable.   Brief Clinical Note  Kiara [name] is a 81 y.o. year old female with end stage OA of her right knee with progressively worsening pain and dysfunction. She has constant pain, with activity and at rest and significant functional deficits with difficulties even with ADLs. She has had extensive non-op management including analgesics, injections of cortisone and viscosupplements, and home exercise program, but remains in significant pain with significant dysfunction.Radiographs show bone on bone arthritis lateral and patellofemoral. She presents now for right Total Knee Arthroplasty.    Procedure in detail---   The patient is brought into the operating room and positioned supine on the operating table. After successful administration of  Spinal,   a tourniquet is placed high on the  Right thigh(s) and the lower extremity is prepped and draped in the usual sterile fashion. Time out is performed by the operating team and then the  Right lower extremity is wrapped in Esmarch, knee flexed and the tourniquet inflated to 300 mmHg.       A midline incision is made with a ten blade through the subcutaneous tissue to the level of the extensor mechanism. A fresh blade is used to make a medial parapatellar arthrotomy. Soft tissue over the proximal medial tibia is subperiosteally elevated to the joint line with a knife and into the semimembranosus bursa with a Cobb elevator.  Soft tissue over the proximal lateral tibia is elevated with attention being paid to avoiding the patellar tendon on the tibial tubercle. The patella is everted, knee flexed 90 degrees and the ACL and PCL are removed. Findings are bone on bone lateral and patellofemoral with exposed bone medial also.        The drill is used to create a starting hole in the distal femur and the canal is thoroughly irrigated with sterile saline to remove the fatty contents. The 5 degree Right  valgus alignment guide is placed into the femoral canal and the distal femoral cutting block is pinned to remove 9 mm off the distal femur. Resection is made with an oscillating saw.      The tibia is subluxed forward and the menisci are removed. The extramedullary alignment guide is placed referencing proximally at the medial aspect of the tibial tubercle and distally along the second metatarsal axis and tibial crest. The block is pinned to remove 37mm off the more deficient medial  side. Resection is made with an oscillating saw. Size 4is the most appropriate size for the tibia and the proximal tibia is prepared with the modular drill and keel punch for that size.      The femoral sizing guide is placed and size 5 is most appropriate. Rotation is marked off the epicondylar axis and confirmed by creating a rectangular flexion gap at 90 degrees. The size 5 cutting block is pinned in this rotation and the anterior, posterior and chamfer cuts are made with the oscillating saw. The intercondylar block is then placed and that cut is made.  Trial size 4 tibial component, trial size 5 narrow posterior stabilized femur and a 6  mm posterior stabilized rotating platform insert trial is placed. Full extension is achieved with excellent varus/valgus and anterior/posterior balance throughout full range of motion. The patella is everted and thickness measured to be 22  mm. Free hand resection is taken to 12 mm, a 35 template is placed, lug holes are  drilled, trial patella is placed, and it tracks normally. Osteophytes are removed off the posterior femur with the trial in place. All trials are removed and the cut bone surfaces prepared with pulsatile lavage. Cement is mixed and once ready for implantation, the size 4 tibial implant, size  5 narrow posterior stabilized femoral component, and the size 35 patella are cemented in place and the patella is held with the clamp. The trial insert is placed and the knee held in full extension. The Exparel (20 ml mixed with 30 ml saline) and .25% Bupivicaine, are injected into the extensor mechanism, posterior capsule, medial and lateral gutters and subcutaneous tissues.  All extruded cement is removed and once the cement is hard the permanent 6 mm posterior stabilized rotating platform insert is placed into the tibial tray.      The wound is copiously irrigated with saline solution and the extensor mechanism closed over a hemovac drain with #1 V-loc suture. The tourniquet is released for a total tourniquet time of 32  minutes. Flexion against gravity is 140 degrees and the patella tracks normally. Subcutaneous tissue is closed with 2.0 vicryl and subcuticular with running 4.0 Monocryl. The incision is cleaned and dried and steri-strips and a bulky sterile dressing are applied. The limb is placed into a knee immobilizer and the patient is awakened and transported to recovery in stable condition.      Please note that a surgical assistant was a medical necessity for this procedure in order to perform it in a safe and expeditious manner. Surgical assistant was necessary to retract the ligaments and vital neurovascular structures to prevent injury to them and also necessary for proper positioning of the limb to allow for anatomic placement of the prosthesis.   Kiara Plover Shuntae Herzig, MD    02/12/2016, 1:43 PM

## 2016-02-12 NOTE — H&P (View-Only) (Signed)
Kiara [name] DOB: 29-May-1934 Married / Language: English / Race: White Female Date of Admission:  02/12/2016 CC:  Right Knee Pain History of Present Illness The patient is a 81 year old female who comes in for a preoperative History and Physical. The patient is scheduled for a right total knee arthroplasty to be performed by Dr. Dione Plover. Aluisio, MD at [facility] on 02-12-2016. The patient is a 80 year old female who presented with knee complaints. The patient was see for a second opinion. The patient reports right knee symptoms including: pain, swelling, soreness and grinding which began 2 year(s) ago without any known injury (She does report a fall about three years ago, where she landed on her knee on a concrete garage floor. Her knee hurt right after the injury, but the pain resolved on its own). The patient describes their pain as aching.The patient feels that the symptoms are worsening. The patient has the current diagnosis of knee osteoarthritis. Previous work-up for this problem has included arthroscopy (11/26/14). Past treatment for this problem has included oral corticosteroids and intra-articular injection of corticosteroids. The most recent cortisone injection was on 11/06/15. She also completed a series of Synvisc on 09/10/15. Symptoms are exacerbated by walking (especially down an incline or steps). Current treatment includes restricted activity (She states that she does not have a lot of pain, unless she is more active. She does do water aerobics 1-2 times per week, as well as using the recumbant every other day. However, she recently just got back from vacation and has not done much exercising since she returned home). Unfortunately, her right knee has gotten progressively worse over time. She has been treated in [facility] by Dr. Marry Guan and Vance Peper. She had the procedures and injection as mentioned above. Unfortunately, injections are no longer beneficial. She has had  swelling in the knee. With the injection, she did not have aspirations. She feels as though the knee has taken over her life. She would like to be more active but the knee is preventing her from doing so. She would like to proceed with surgery. They have been treated conservatively in the past for the above stated problem and despite conservative measures, they continue to have progressive pain and severe functional limitations and dysfunction. They have failed non-operative management including home exercise, medications, and injections. It is felt that they would benefit from undergoing total joint replacement. Risks and benefits of the procedure have been discussed with the patient and they elect to proceed with surgery. There are no active contraindications to surgery such as ongoing infection or rapidly progressive neurological disease.  Problem List/Past Medical  Contusion of knee, right (S80.01XA)  Primary osteoarthritis of right knee (M17.11)  Diverticulitis Of Colon  Cervical pain (M54.2)  Thoracic spine pain (M54.6)  Shoulder pain (M25.519)  History of C. Diff  Allergies  No Known Drug Allergies  Family History  Osteoporosis  Mother. Congestive Heart Failure  Father. Hypertension  Father, First Degree Relatives.  Social History Tobacco use  Former smoker. 02/08/2013 Number of flights of stairs before winded  4-5 Living situation  live with spouse Current drinker  02/08/2013: Currently drinks beer, wine and hard liquor 5-7 times per week Children  3 No history of drug/alcohol rehab  Marital status  married Not under pain contract  Exercise  Exercises weekly; does running / walking and other Current work status  retired  Medication History Probiotic Acidophilus (Oral) Active. Advil (200MG  Tablet, Oral as needed) Active.  Osteo Bi-Flex Joint Shield (Oral) Active.  Past Surgical History Tonsillectomy  Hemorrhoidectomy  Appendectomy  Cataract  Surgery  bilateral Mole Excision from Chest Wall   Review of Systems General Not Present- Chills, Fatigue, Fever, Memory Loss, Night Sweats, Weight Gain and Weight Loss. Skin Not Present- Eczema, Hives, Itching, Lesions and Rash. HEENT Not Present- Dentures, Double Vision, Headache, Hearing Loss, Tinnitus and Visual Loss. Respiratory Not Present- Allergies, Chronic Cough, Coughing up blood, Shortness of breath at rest and Shortness of breath with exertion. Cardiovascular Not Present- Chest Pain, Difficulty Breathing Lying Down, Murmur, Palpitations, Racing/skipping heartbeats and Swelling. Gastrointestinal Present- Constipation. Not Present- Abdominal Pain, Bloody Stool, Diarrhea, Difficulty Swallowing, Heartburn, Jaundice, Loss of appetitie, Nausea and Vomiting. Female Genitourinary Not Present- Blood in Urine, Discharge, Flank Pain, Incontinence, Painful Urination, Urgency, Urinary frequency, Urinary Retention, Urinating at Night and Weak urinary stream. Musculoskeletal Present- Joint Pain. Not Present- Back Pain, Joint Swelling, Morning Stiffness, Muscle Pain, Muscle Weakness and Spasms. Neurological Not Present- Blackout spells, Difficulty with balance, Dizziness, Paralysis, Tremor and Weakness. Psychiatric Not Present- Insomnia.  Vitals  Weight: 147 lb Height: 64in Weight was reported by patient. Height was reported by patient. Body Surface Area: 1.72 m Body Mass Index: 25.23 kg/m  Pulse: 80 (Regular)  BP: 138/72 (Sitting, Right Arm, Standard)  Physical Exam  General Mental Status -Alert, cooperative and good historian. General Appearance-pleasant, Not in acute distress. Orientation-Oriented X3. Build & Nutrition-Well nourished and Well developed.  Head and Neck Head-normocephalic, atraumatic . Neck Global Assessment - supple, no bruit auscultated on the right, no bruit auscultated on the left.  Eye Vision-Wears corrective lenses. Pupil -  Bilateral-Regular and Round. Note: bruise on outside of right eye due to recent fall Motion - Bilateral-EOMI.  Chest and Lung Exam Auscultation Breath sounds - clear at anterior chest wall and clear at posterior chest wall. Adventitious sounds - No Adventitious sounds.  Cardiovascular Auscultation Rhythm - Regular rate and rhythm. Heart Sounds - S1 WNL and S2 WNL. Murmurs & Other Heart Sounds - Auscultation of the heart reveals - No Murmurs.  Abdomen Palpation/Percussion Tenderness - Abdomen is non-tender to palpation. Rigidity (guarding) - Abdomen is soft. Auscultation Auscultation of the abdomen reveals - Bowel sounds normal.  Female Genitourinary Note: Not done, not pertinent to present illness   Musculoskeletal Note: On exam, she is alert and oriented, in no apparent distress. Her hips show normal range of motion with no discomfort. Her left knee shows no effusion. Range 0 to 135. There is moderate crepitus on range of motion of the left knee. No tenderness or instability. Right knee moderate effusion, range 5 to 125. She has moderate crepitus on range of motion. She has lateral greater than medial joint line tenderness. There is no instability noted about the knee. Gait pattern is significantly antalgic on the right.  RADIOGRAPHS Her radiographs taken today; AP both knees and lateral of the right demonstrate that she has bone-on-bone arthritis, lateral and patellofemoral compartments. She does not have any significant deformity.   Assessment & Plan Primary osteoarthritis of right knee (M17.11)  Note:Surgical Plans: Right Total Knee Replacement  Disposition: Home  PCP: Dr. Emily Filbert - Patient has been seen preoperatively and felt to be stable for surgery. "Low risk".  IV TXA  Anesthesia Issues: None  Signed electronically by Ok Edwards, III PA-C

## 2016-02-12 NOTE — Anesthesia Procedure Notes (Signed)
Spinal  Patient location during procedure: OR Staffing Anesthesiologist: Nolon Nations Performed: anesthesiologist  Preanesthetic Checklist Completed: patient identified, site marked, surgical consent, pre-op evaluation, timeout performed, IV checked, risks and benefits discussed and monitors and equipment checked Spinal Block Patient position: sitting Prep: Betadine Patient monitoring: heart rate, continuous pulse ox and blood pressure Approach: right paramedian Location: L3-4 Injection technique: single-shot Needle Needle type: Sprotte  Needle gauge: 24 G Needle length: 9 cm Additional Notes Expiration date of kit checked and confirmed. Patient tolerated procedure well, without complications.

## 2016-02-12 NOTE — Progress Notes (Signed)
Patient complains of right knee and upper back aching- also complains of left mid chest discomfort-aching- Robaxin  IVB given

## 2016-02-12 NOTE — Anesthesia Postprocedure Evaluation (Signed)
Anesthesia Post Note  Patient: GISEL DEWINTER  Procedure(s) Performed: Procedure(s) (LRB): RIGHT TOTAL KNEE ARTHROPLASTY (Right)  Patient location during evaluation: PACU Anesthesia Type: Spinal and MAC Level of consciousness: awake and alert Pain management: pain level controlled Vital Signs Assessment: post-procedure vital signs reviewed and stable Respiratory status: spontaneous breathing and respiratory function stable Cardiovascular status: blood pressure returned to baseline and stable Postop Assessment: spinal receding Anesthetic complications: no Comments: Some mild chest discomfort. Better with movement. EKG with no obvious significant change from prior. Denies nausea or pain radiating up jaw or down arm.    Last Vitals:  Vitals:   02/12/16 1516 02/12/16 1530  BP: (!) 172/80 (!) 170/77  Pulse: 69 (!) 58  Resp: (!) 23   Temp:      Last Pain:  Vitals:   02/12/16 1447  TempSrc:   PainSc: [place]

## 2016-02-12 NOTE — Progress Notes (Signed)
Dr. Lissa Hoard in to see EKG copy and talked with patient- O.K. To go to floor

## 2016-02-12 NOTE — Progress Notes (Signed)
12 Lead EKG done.

## 2016-02-12 NOTE — Progress Notes (Signed)
Dr. Lissa Hoard called EKG results.

## 2016-02-12 NOTE — Anesthesia Procedure Notes (Signed)
Date/Time: 02/12/2016 12:40 PM Performed by: Dione Booze Pre-anesthesia Checklist: Emergency Drugs available, Suction available, Patient being monitored and Patient identified Patient Re-evaluated:Patient Re-evaluated prior to inductionOxygen Delivery Method: Simple face mask Placement Confirmation: positive ETCO2

## 2016-02-12 NOTE — Progress Notes (Signed)
Patient states she is feeling better- wants something to drink

## 2016-02-12 NOTE — Transfer of Care (Signed)
Immediate Anesthesia Transfer of Care Note  Patient: Kiara [name]  Procedure(s) Performed: Procedure(s): RIGHT TOTAL KNEE ARTHROPLASTY (Right)  Patient Location: PACU  Anesthesia Type:MAC and Spinal  Level of Consciousness: awake, alert , oriented and patient cooperative  Airway & Oxygen Therapy: Patient Spontanous Breathing and Patient connected to face mask oxygen  Post-op Assessment: Report given to RN and Post -op Vital signs reviewed and stable  Post vital signs: Reviewed and stable  Last Vitals:  Vitals:   02/12/16 1028  BP: (!) 159/68  Pulse: 71  Resp: 16  Temp: 36.4 C    Last Pain:  Vitals:   02/12/16 1034  TempSrc:   PainSc: 0-No pain         Complications: No apparent anesthesia complications

## 2016-02-12 NOTE — Anesthesia Postprocedure Evaluation (Deleted)
Anesthesia Post Note  Patient: Kiara [name]  Procedure(s) Performed: Procedure(s) (LRB): RIGHT TOTAL KNEE ARTHROPLASTY (Right)  Patient location during evaluation: PACU Anesthesia Type: Spinal and MAC Level of consciousness: awake and alert Pain management: pain level controlled Vital Signs Assessment: post-procedure vital signs reviewed and stable Respiratory status: spontaneous breathing and respiratory function stable Cardiovascular status: blood pressure returned to baseline and stable Postop Assessment: spinal receding Anesthetic complications: no    Last Vitals:  Vitals:   02/12/16 1028  BP: (!) 159/68  Pulse: 71  Resp: 16  Temp: 36.4 C    Last Pain:  Vitals:   02/12/16 1034  TempSrc:   PainSc: 0-No pain                 Nolon Nations

## 2016-02-12 NOTE — Progress Notes (Signed)
Called Dr Wynelle Link about right knee scab. Dr Wynelle Link to evaluate this in holding area.

## 2016-02-13 ENCOUNTER — Encounter (HOSPITAL_COMMUNITY): Payer: Self-pay | Admitting: Orthopedic Surgery

## 2016-02-13 LAB — CBC
HCT: 33.4 % — ABNORMAL LOW (ref 36.0–46.0)
Hemoglobin: 11.5 g/dL — ABNORMAL LOW (ref 12.0–15.0)
MCH: 32.6 pg (ref 26.0–34.0)
MCHC: 34.4 g/dL (ref 30.0–36.0)
MCV: 94.6 fL (ref 78.0–100.0)
Platelets: 223 10*3/uL (ref 150–400)
RBC: 3.53 MIL/uL — AB (ref 3.87–5.11)
RDW: 12.2 % (ref 11.5–15.5)
WBC: 8.2 10*3/uL (ref 4.0–10.5)

## 2016-02-13 LAB — BASIC METABOLIC PANEL
ANION GAP: 8 (ref 5–15)
BUN: 14 mg/dL (ref 6–20)
CHLORIDE: 101 mmol/L (ref 101–111)
CO2: 24 mmol/L (ref 22–32)
Calcium: 9 mg/dL (ref 8.9–10.3)
Creatinine, Ser: 0.75 mg/dL (ref 0.44–1.00)
GFR calc Af Amer: >60 mL/min (ref >60)
GLUCOSE: 135 mg/dL — AB (ref 65–99)
POTASSIUM: 4.1 mmol/L (ref 3.5–5.1)
Sodium: 133 mmol/L — ABNORMAL LOW (ref 135–145)

## 2016-02-13 NOTE — Evaluation (Signed)
Physical Therapy Evaluation Patient Details Name: Kiara [name] MRN: [id number] DOB: 08-10-1934 Today's Date: 02/13/2016   History of Present Illness  s/p R TKA.  Clinical Impression  Pt s/p R TKR presents with decreased R LE strength/ROM and post op pain limiting functional mobility. Pt should progress well to dc home with family assist and HHPT follow up.    Follow Up Recommendations Home health PT    Equipment Recommendations  Rolling walker with 5" wheels    Recommendations for Other Services OT consult     Precautions / Restrictions Precautions Precautions: Knee;Fall Required Braces or Orthoses: Knee Immobilizer - Right Knee Immobilizer - Right: Discontinue once straight leg raise with < 10 degree lag Restrictions Weight Bearing Restrictions: No Other Position/Activity Restrictions: WBAT      Mobility  Bed Mobility Overal bed mobility: Needs Assistance Bed Mobility: Sit to Supine       Sit to supine: Min assist   General bed mobility comments: assist for RLE  Transfers Overall transfer level: Needs assistance Equipment used: Rolling walker (2 wheeled) Transfers: Sit to/from Stand Sit to Stand: Min assist         General transfer comment: assist to rise and steady.  Cues for UE/LE placement  Ambulation/Gait Ambulation/Gait assistance: Min assist Ambulation Distance (Feet): 99 Feet Assistive device: Rolling walker (2 wheeled) Gait Pattern/deviations: Step-to pattern;Decreased step length - right;Decreased step length - left;Shuffle;Trunk flexed Gait velocity: decr Gait velocity interpretation: Below normal speed for age/gender General Gait Details: cues for posture, position from RW and initial sequence  Stairs            Wheelchair Mobility    Modified Rankin (Stroke Patients Only)       Balance                                             Pertinent Vitals/Pain Pain Assessment: 0-10 Pain Score: 4  Pain  Location: R knee Pain Descriptors / Indicators: Aching;Sore Pain Intervention(s): Limited activity within patient's tolerance;Monitored during session;Premedicated before session;Ice applied    Home Living Family/patient expects to be discharged to:: Private residence Living Arrangements: Spouse/significant other Available Help at Discharge: Family Type of Home: House Home Access: Level entry     Home Layout: One level   Additional Comments: borrowed 3:1 and shower seat    Prior Function Level of Independence: Independent               Hand Dominance        Extremity/Trunk Assessment   Upper Extremity Assessment: Overall WFL for tasks assessed           Lower Extremity Assessment: RLE deficits/detail      Cervical / Trunk Assessment: Normal  Communication   Communication: No difficulties  Cognition Arousal/Alertness: Awake/alert Behavior During Therapy: WFL for tasks assessed/performed Overall Cognitive Status: Within Functional Limits for tasks assessed                      General Comments      Exercises Total Joint Exercises Ankle Circles/Pumps: AROM;Both;15 reps;Supine   Assessment/Plan    PT Assessment Patient needs continued PT services  PT Problem List Decreased strength;Decreased range of motion;Decreased activity tolerance;Decreased mobility;Decreased knowledge of use of DME;Pain          PT Treatment Interventions DME instruction;Gait training;Stair training;Functional mobility training;Therapeutic activities;Therapeutic exercise;Patient/family  education    PT Goals (Current goals can be found in the Care Plan section)  Acute Rehab PT Goals Patient Stated Goal: return to independence PT Goal Formulation: With patient Time For Goal Achievement: 02/16/16 Potential to Achieve Goals: Good    Frequency 7X/week   Barriers to discharge        Co-evaluation               End of Session Equipment Utilized During  Treatment: Gait belt;Right knee immobilizer Activity Tolerance: Patient tolerated treatment well Patient left: with call bell/phone within reach;in bed;with family/visitor present Nurse Communication: Mobility status         Time: 1025-1050 PT Time Calculation (min) (ACUTE ONLY): 25 min   Charges:   PT Evaluation $PT Eval Low Complexity: 1 Procedure PT Treatments $Gait Training: 8-22 mins   PT G Codes:        Chimene Salo 02/19/2016, 1:02 PM

## 2016-02-13 NOTE — Evaluation (Signed)
Occupational Therapy Evaluation Patient Details Name: Kiara [name] MRN: [id number] DOB: September 02, 1934 Today's Date: 02/13/2016    History of Present Illness s/p R TKA.   Clinical Impression   This 81 year old female was admitted for the above.  She was independent with adls prior to admission, and husband will assist with adls at home. Will follow in acute setting with min guard level goals for bathroom transfers.  Pt currently needs min A for SPT    Follow Up Recommendations  Supervision/Assistance - 24 hour    Equipment Recommendations  None recommended by OT    Recommendations for Other Services       Precautions / Restrictions Precautions Precautions: Knee;Fall Required Braces or Orthoses: Knee Immobilizer - Right Restrictions Weight Bearing Restrictions: No      Mobility Bed Mobility Overal bed mobility: Needs Assistance Bed Mobility: Supine to Sit     Supine to sit: Min assist     General bed mobility comments: assist for RLE  Transfers Overall transfer level: Needs assistance Equipment used: Rolling walker (2 wheeled) Transfers: Sit to/from Stand Sit to Stand: Min assist         General transfer comment: assist to rise and steady.  Cues for UE/LE placement    Balance                                            ADL Overall ADL's : Needs assistance/impaired     Grooming: Oral care;Set up;Sitting   Upper Body Bathing: Set up;Sitting   Lower Body Bathing: Moderate assistance;Sit to/from stand   Upper Body Dressing : Set up;Sitting   Lower Body Dressing: Moderate assistance;Sit to/from stand   Toilet Transfer: Minimal assistance;Stand-pivot;BSC;RW (to chair)             General ADL Comments: performed ADL from EOB.  Pt had catheter in place. Educated on use of reacher vs assist from husband     Estate agent      Pertinent Vitals/Pain Pain Assessment: Faces Faces Pain Scale: Hurts  little more Pain Location: R knee Pain Descriptors / Indicators: Aching Pain Intervention(s): Limited activity within patient's tolerance;Monitored during session;Premedicated before session;Repositioned;Ice applied     Hand Dominance     Extremity/Trunk Assessment Upper Extremity Assessment Upper Extremity Assessment: Overall WFL for tasks assessed           Communication Communication Communication: No difficulties   Cognition Arousal/Alertness: Awake/alert Behavior During Therapy: WFL for tasks assessed/performed Overall Cognitive Status: Within Functional Limits for tasks assessed                     General Comments       Exercises       Shoulder Instructions      Home [name] Family/patient expects to be discharged to:: Private residence [name] Arrangements: Spouse/significant other Available Help at Discharge: Family               Bathroom Shower/Tub: Walk-in Psychologist, prison and probation services: Standard         Additional Comments: borrowed 3:1 and shower seat      Prior Functioning/Environment Level of Independence: Independent                 OT Problem List: Decreased strength;Pain;Decreased knowledge of use of DME or  AE   OT Treatment/Interventions: Self-care/ADL training;DME and/or AE instruction;Patient/family education    OT Goals(Current goals can be found in the care plan section) Acute Rehab OT Goals Patient Stated Goal: return to independence OT Goal Formulation: With patient Time For Goal Achievement: 02/20/16 Potential to Achieve Goals: Good ADL Goals Pt Will Perform Grooming: with supervision;standing Pt Will Transfer to Toilet: with min guard assist;ambulating;bedside commode Pt Will Perform Toileting - Clothing Manipulation and hygiene: with min guard assist;sit to/from stand Pt Will Perform Tub/Shower Transfer: Shower transfer;with min guard assist;ambulating;3 in 1  OT Frequency: Min 2X/week   Barriers to D/C:             Co-evaluation              End of Session CPM Right Knee CPM Right Knee: Off  Activity Tolerance: Patient tolerated treatment well Patient left: in chair;with call bell/phone within reach;with family/visitor present   Time: 0823-0902 OT Time Calculation (min): 39 min Charges:  OT General Charges $OT Visit: 1 Procedure OT Evaluation $OT Eval Low Complexity: 1 Procedure OT Treatments $Self Care/Home Management : 8-22 mins G-Codes:    Kiara [name] February 15, 2016, 10:01 AM  Lesle Chris, OTR/L [phone] 02-15-2016

## 2016-02-13 NOTE — Care Management Note (Signed)
Case Management Note  Patient Details  Name: Kiara [name] MRN: [id number] Date of Birth: 1934-07-03  Subjective/Objective:                  RIGHT TOTAL KNEE ARTHROPLASTY (Right) Action/Plan: Discharge planning Expected Discharge Date:                  Expected Discharge Plan:  [place]  In-House Referral:     Discharge planning Services  CM Consult  Post Acute Care Choice:  Home Health Choice offered to:  Patient  DME Arranged:  3-N-1, Walker rolling DME Agency:  [place]:  PT Soldier:  [employer] [place]  Status of Service:  Completed, signed off  If discussed at [place] of Stay Meetings, dates discussed:    Additional Comments: CM met with pt in room to offer choice of home health agency. Pt chooses [facility] to render HHPT.  Referral called to [facility] rep, Santiago Glad.  CM notified [facility] DME Rep, Reggie to please deliver the rolling walker and 3n1 to room prior to discharge.  No other CM needs were communicated. Dellie Catholic, RN 02/13/2016, 12:07 PM

## 2016-02-13 NOTE — Progress Notes (Signed)
Physical Therapy Treatment Patient Details Name: Kiara [name] MRN: [id number] DOB: [birth year] Today's Date: 02/13/2016    History of Present Illness s/p R TKA.    PT Comments    Pt progressing well with mobility and hopeful for dc home tomorrow.  Follow Up Recommendations  Home health PT     Equipment Recommendations  Rolling walker with 5" wheels    Recommendations for Other Services OT consult     Precautions / Restrictions Precautions Precautions: Knee;Fall Required Braces or Orthoses: Knee Immobilizer - Right Knee Immobilizer - Right: Discontinue once straight leg raise with < 10 degree lag (Pt performed IND SLR this pm) Restrictions Weight Bearing Restrictions: No Other Position/Activity Restrictions: WBAT    Mobility  Bed Mobility Overal bed mobility: Needs Assistance Bed Mobility: Supine to Sit;Sit to Supine     Supine to sit: Min guard Sit to supine: Min guard   General bed mobility comments: assist for RLE  Transfers Overall transfer level: Needs assistance Equipment used: Rolling walker (2 wheeled) Transfers: Sit to/from Stand Sit to Stand: Min guard         General transfer comment: cues for LE management and use of UEs to self assist  Ambulation/Gait Ambulation/Gait assistance: Min guard Ambulation Distance (Feet): 123 Feet Assistive device: Rolling walker (2 wheeled) Gait Pattern/deviations: Step-to pattern;Step-through pattern;Decreased step length - right;Decreased step length - left;Shuffle;Trunk flexed Gait velocity: decr Gait velocity interpretation: Below normal speed for age/gender General Gait Details: cues for posture, position from RW and initial sequence   Stairs            Wheelchair Mobility    Modified Rankin (Stroke Patients Only)       Balance                                    Cognition Arousal/Alertness: Awake/alert Behavior During Therapy: WFL for tasks assessed/performed Overall  Cognitive Status: Within Functional Limits for tasks assessed                      Exercises Total Joint Exercises Ankle Circles/Pumps: AROM;Both;15 reps;Supine Quad Sets: AROM;Both;10 reps;Supine Heel Slides: AAROM;Right;15 reps;Supine Straight Leg Raises: AAROM;AROM;Right;20 reps;Supine Goniometric ROM: AAROM R knee -8 - 65    General Comments        Pertinent Vitals/Pain Pain Assessment: 0-10 Pain Score: 4  Pain Location: R knee Pain Descriptors / Indicators: Aching;Sore Pain Intervention(s): Limited activity within patient's tolerance;Monitored during session;Premedicated before session;Ice applied    Home Living Family/patient expects to be discharged to:: Private residence Living Arrangements: Spouse/significant other Available Help at Discharge: Family Type of Home: House Home Access: Level entry   Home Layout: One level   Additional Comments: borrowed 3:1 and shower seat    Prior Function Level of Independence: Independent          PT Goals (current goals can now be found in the care plan section) Acute Rehab PT Goals Patient Stated Goal: return to independence PT Goal Formulation: With patient Time For Goal Achievement: 02/16/16 Potential to Achieve Goals: Good Progress towards PT goals: Progressing toward goals    Frequency    7X/week      PT Plan Current plan remains appropriate    Co-evaluation             End of Session Equipment Utilized During Treatment: Gait belt Activity Tolerance: Patient tolerated treatment well Patient left: with  call bell/phone within reach;in bed;with family/visitor present     Time: [id number] PT Time Calculation (min) (ACUTE ONLY): 33 min  Charges:  $Gait Training: 8-22 mins $Therapeutic Exercise: 8-22 mins                    G Codes:      Kiara [name] 2016/02/19, 1:59 PM

## 2016-02-13 NOTE — Progress Notes (Signed)
   Subjective: 1 Day Post-Op Procedure(s) (LRB): RIGHT TOTAL KNEE ARTHROPLASTY (Right) Patient reports pain as mild.   Patient seen in rounds by Dr. Wynelle Link. Patient is well, but has had some minor complaints of pain in the knee, requiring pain medications We will start therapy today.  Plan is to go Home after hospital stay.  Objective: Vital signs in last 24 hours: Temp:  [97.5 F (36.4 C)-98.1 F (36.7 C)] 97.8 F (36.6 C) (11/16 0550) Pulse Rate:  [53-83] 60 (11/16 0550) Resp:  [9-23] 16 (11/16 0550) BP: (127-180)/(55-109) 160/72 (11/16 0550) SpO2:  [71 %-100 %] 98 % (11/16 0550) Weight:  [67.6 kg (149 lb)] 67.6 kg (149 lb) (11/15 1034)  Intake/Output from previous day:  Intake/Output Summary (Last 24 hours) at 02/13/16 0757 Last data filed at 02/13/16 0600  Gross per 24 hour  Intake             3255 ml  Output             4305 ml  Net            -1050 ml    Intake/Output this shift: No intake/output data recorded.  Labs:  Recent Labs  02/13/16 0406  HGB 11.5*    Recent Labs  02/13/16 0406  WBC 8.2  RBC 3.53*  HCT 33.4*  PLT 223    Recent Labs  02/13/16 0406  NA 133*  K 4.1  CL 101  CO2 24  BUN 14  CREATININE 0.75  GLUCOSE 135*  CALCIUM 9.0   No results for input(s): LABPT, INR in the last 72 hours.  EXAM General - Patient is Alert, Appropriate and Oriented Extremity - Neurovascular intact Sensation intact distally Dorsiflexion/Plantar flexion intact Dressing - dressing C/D/I Motor Function - intact, moving foot and toes well on exam.  Hemovac pulled without difficulty.  Past Medical History:  Diagnosis Date  . Arthritis    osteoarthritis -right knee.  . C. difficile colitis    past,no recent issues  . Colitis   . Diverticulitis   . Fall    "tripped"-10 days ago-scrapped right knee and lateral right brow area-" healing"  . GERD (gastroesophageal reflux disease)    occ.  . Seasonal allergies     Assessment/Plan: 1 Day Post-Op  Procedure(s) (LRB): RIGHT TOTAL KNEE ARTHROPLASTY (Right) Principal Problem:   OA (osteoarthritis) of knee  Estimated body mass index is 25.58 kg/m as calculated from the following:   Height as of this encounter: 5\' 4"  (1.626 m).   Weight as of this encounter: 67.6 kg (149 lb). Up with therapy Plan for discharge tomorrow  DVT Prophylaxis - Xarelto Weight-Bearing as tolerated to right leg D/C O2 and Pulse OX and try on Room Air  Arlee Muslim, PA-C Orthopaedic Surgery 02/13/2016, 7:57 AM

## 2016-02-14 LAB — CBC
HCT: 29.5 % — ABNORMAL LOW (ref 36.0–46.0)
HEMOGLOBIN: 10 g/dL — AB (ref 12.0–15.0)
MCH: 33.2 pg (ref 26.0–34.0)
MCHC: 33.9 g/dL (ref 30.0–36.0)
MCV: 98 fL (ref 78.0–100.0)
PLATELETS: 234 10*3/uL (ref 150–400)
RBC: 3.01 MIL/uL — AB (ref 3.87–5.11)
RDW: 12.8 % (ref 11.5–15.5)
WBC: 9.2 10*3/uL (ref 4.0–10.5)

## 2016-02-14 LAB — BASIC METABOLIC PANEL
Anion gap: 6 (ref 5–15)
BUN: 16 mg/dL (ref 6–20)
CHLORIDE: 104 mmol/L (ref 101–111)
CO2: 25 mmol/L (ref 22–32)
CREATININE: 0.74 mg/dL (ref 0.44–1.00)
Calcium: 9.2 mg/dL (ref 8.9–10.3)
Glucose, Bld: 117 mg/dL — ABNORMAL HIGH (ref 65–99)
POTASSIUM: 4 mmol/L (ref 3.5–5.1)
SODIUM: 135 mmol/L (ref 135–145)

## 2016-02-14 MED ORDER — RIVAROXABAN 10 MG PO TABS: 10.0000 mg | ORAL_TABLET | Freq: Every day | ORAL | 0 refills | Status: DC

## 2016-02-14 MED ORDER — METHOCARBAMOL 500 MG PO TABS: 500.0000 mg | ORAL_TABLET | Freq: Four times a day (QID) | ORAL | 0 refills | Status: DC | PRN

## 2016-02-14 MED ORDER — OXYCODONE HCL 5 MG PO TABS: 5.0000 mg | ORAL_TABLET | ORAL | 0 refills | Status: DC | PRN

## 2016-02-14 MED ORDER — TRAMADOL HCL 50 MG PO TABS: 50.0000 mg | ORAL_TABLET | Freq: Four times a day (QID) | ORAL | 1 refills | Status: DC | PRN

## 2016-02-14 NOTE — Progress Notes (Signed)
Physical Therapy Treatment Patient Details Name: Kiara [name] MRN: [id number] DOB: 1934-07-08 Today's Date: 02/14/2016    History of Present Illness s/p R TKA.    PT Comments    Patient was seen in bed upon arrival. Pain was rated as a 4/10 before treatment and pt c/o minor grogginess from the pain medication. Performed supine to sit x min guard with minA to support RLE when moving in bed. Sit to stand x min guard from elevated surface. Required VC's for safe UE/LE placement. Gait x 100 ft x min guard with VC's for safe LE placement within the walker and to maintain upright posture. Performed AROM/AAROM exercises in the bed following ambulation. Pain increased to a 5/10 following therapeutic exercises. Educated family on exercises and how to donn/doff knee immobilizer. Husband demonstrated donning/doffing brace. Applied ice to R knee following treatment to decrease swelling and pain. Educated patient and family on icing schedule. Patient does not need second session this afternoon. Nursing notified.   Follow Up Recommendations        Equipment Recommendations       Recommendations for Other Services       Precautions / Restrictions Precautions Precautions: Knee;Fall Required Braces or Orthoses: Knee Immobilizer - Right Knee Immobilizer - Right: Discontinue once straight leg raise with < 10 degree lag Restrictions Weight Bearing Restrictions: No Other Position/Activity Restrictions: WBAT    Mobility  Bed Mobility Overal bed mobility: Needs Assistance Bed Mobility: Supine to Sit;Sit to Supine     Supine to sit: Min assist Sit to supine: Min guard   General bed mobility comments: minA required to support RLE while moving in bed.   Transfers Overall transfer level: Needs assistance Equipment used: Rolling walker (2 wheeled) Transfers: Sit to/from Stand Sit to Stand: Min guard         General transfer comment: VC's for safe UE/LE placement.    Ambulation/Gait Ambulation/Gait assistance: Min guard Ambulation Distance (Feet): 100 Feet Assistive device: Rolling walker (2 wheeled) Gait Pattern/deviations: Step-to pattern;Decreased stance time - right;Decreased stride length;Trunk flexed;Antalgic Gait velocity: decr Gait velocity interpretation: Below normal speed for age/gender General Gait Details: VC's to maintain upright posture and for LE placement within the walker during ambulation.    Stairs            Wheelchair Mobility    Modified Rankin (Stroke Patients Only)       Balance                                    Cognition Arousal/Alertness: Awake/alert Behavior During Therapy: WFL for tasks assessed/performed Overall Cognitive Status: Within Functional Limits for tasks assessed                      Exercises Total Joint Exercises Ankle Circles/Pumps: AROM;Both;Supine;10 reps Quad Sets: AROM;Both;10 reps;Supine Towel Squeeze: AROM;Supine;Both;10 reps Short Arc QuadSinclair Ship;Right;10 reps;Supine Heel Slides: AAROM;Right;10 reps;Supine Hip ABduction/ADduction: AROM;Right;10 reps;Supine Straight Leg Raises: AAROM;Right;10 reps;Supine    General Comments        Pertinent Vitals/Pain Pain Assessment: 0-10 Pain Score: 4  Pain Location: R knee Pain Descriptors / Indicators: Discomfort;Sore Pain Intervention(s): Limited activity within patient's tolerance;Monitored during session;Premedicated before session;Repositioned;Ice applied    Home Living                      Prior Function  PT Goals (current goals can now be found in the care plan section) Progress towards PT goals: Progressing toward goals    Frequency           PT Plan      Co-evaluation             End of Session Equipment Utilized During Treatment: Gait belt Activity Tolerance: Patient tolerated treatment well Patient left: with call bell/phone within reach;with  family/visitor present;in chair     Time:  - 10:05 - 10:46    Charges:     1 gt  1 te  1 ta                   G CodesHall [name], SPTA [facility] Acute Rehab [phone]  Present and agree with above  Rica Koyanagi  PTA [facility]  Acute  Rehab Pager      [phone]

## 2016-02-14 NOTE — Progress Notes (Signed)
Occupational Therapy Treatment Patient Details Name: RAMATOULAYE PACK MRN: [id number] DOB: 1935/02/18 Today's Date: 02/14/2016    History of present illness s/p R TKA.   OT comments  All education completed this session  Follow Up Recommendations  Supervision/Assistance - 24 hour    Equipment Recommendations  None recommended by OT    Recommendations for Other Services      Precautions / Restrictions Precautions Precautions: Knee;Fall Required Braces or Orthoses: Knee Immobilizer - Right Knee Immobilizer - Right: Discontinue once straight leg raise with < 10 degree lag Restrictions Weight Bearing Restrictions: No Other Position/Activity Restrictions: WBAT       Mobility Bed Mobility         Supine to sit: Min assist     General bed mobility comments: assist for RLE  Transfers   Equipment used: Rolling walker (2 wheeled)   Sit to Stand: Min guard         General transfer comment: cues for RLE    Balance                                   ADL Overall ADL's : Needs assistance/impaired     Grooming: Wash/dry Radiographer, therapeutic: Min guard;Ambulation;BSC;RW   Toileting- Water quality scientist and Hygiene: Min guard;Sit to/from stand   Tub/ Shower Transfer: Walk-in shower;Min guard;Ambulation;3 in 1;Rolling walker     General ADL Comments: pt felt lightheaded and nauseous at the end of session  BP 145/65.  Pt did not feel she needed medication:  provided cool cloth and ginger ale. RN aware.  Gave handout on shower transfer      Vision                     Perception     Praxis      Cognition   Behavior During Therapy: WFL for tasks assessed/performed Overall Cognitive Status: Within Functional Limits for tasks assessed                       Extremity/Trunk Assessment               Exercises     Shoulder Instructions       General Comments       Pertinent Vitals/ Pain       Pain Score: 4  Pain Location: R knee Pain Descriptors / Indicators: Sore Pain Intervention(s): Monitored during session;Premedicated before session;Repositioned;Ice applied  Home Living                                          Prior Functioning/Environment              Frequency           Progress Toward Goals  OT Goals(current goals can now be found in the care plan section)  Progress towards OT goals: Goals met and updated - see care plan     Plan      Co-evaluation                 End of Session CPM Right Knee CPM Right Knee: Off   Activity Tolerance Other (comment) (tolerated OK but lightheaded/nauseous at end of  session)   Patient Left in chair;with call bell/phone within reach;with family/visitor present   Nurse Communication          Time: [phone] OT Time Calculation (min): 27 min  Charges: OT General Charges $OT Visit: 1 Procedure OT Treatments $Self Care/Home Management : 23-37 mins  Bienvenido Proehl 02/14/2016, 9:58 AM  Lesle Chris, OTR/L [phone] 02/14/2016

## 2016-02-14 NOTE — Progress Notes (Signed)
   Subjective: 2 Days Post-Op Procedure(s) (LRB): RIGHT TOTAL KNEE ARTHROPLASTY (Right) Patient reports pain as mild and moderate.   Patient seen in rounds by Dr. Wynelle Link. Patient is well, but has had some minor complaints of pain in the knee, requiring pain medications Patient is ready to go home  Objective: Vital signs in last 24 hours: Temp:  [97.8 F (36.6 C)-98.8 F (37.1 C)] 98.5 F (36.9 C) (11/17 0550) Pulse Rate:  [64-78] 70 (11/17 0550) Resp:  [16] 16 (11/17 0550) BP: (124-146)/(63-73) 139/63 (11/17 0550) SpO2:  [95 %-99 %] 95 % (11/17 0550)  Intake/Output from previous day:  Intake/Output Summary (Last 24 hours) at 02/14/16 0742 Last data filed at 02/14/16 0406  Gross per 24 hour  Intake          1031.08 ml  Output             2575 ml  Net         -1543.92 ml    Intake/Output this shift: No intake/output data recorded.  Labs:  Recent Labs  02/13/16 0406 02/14/16 0358  HGB 11.5* 10.0*    Recent Labs  02/13/16 0406 02/14/16 0358  WBC 8.2 9.2  RBC 3.53* 3.01*  HCT 33.4* 29.5*  PLT 223 234    Recent Labs  02/13/16 0406 02/14/16 0358  NA 133* 135  K 4.1 4.0  CL 101 104  CO2 24 25  BUN 14 16  CREATININE 0.75 0.74  GLUCOSE 135* 117*  CALCIUM 9.0 9.2   No results for input(s): LABPT, INR in the last 72 hours.  EXAM: General - Patient is Alert, Appropriate and Oriented Extremity - Neurovascular intact Sensation intact distally Dorsiflexion/Plantar flexion intact Incision - clean, dry, no drainage Motor Function - intact, moving foot and toes well on exam.   Assessment/Plan: 2 Days Post-Op Procedure(s) (LRB): RIGHT TOTAL KNEE ARTHROPLASTY (Right) Procedure(s) (LRB): RIGHT TOTAL KNEE ARTHROPLASTY (Right) Past Medical History:  Diagnosis Date  . Arthritis    osteoarthritis -right knee.  . C. difficile colitis    past,no recent issues  . Colitis   . Diverticulitis   . Fall    "tripped"-10 days ago-scrapped right knee and lateral  right brow area-" healing"  . GERD (gastroesophageal reflux disease)    occ.  . Seasonal allergies    Principal Problem:   OA (osteoarthritis) of knee  Estimated body mass index is 25.58 kg/m as calculated from the following:   Height as of this encounter: 5\' 4"  (1.626 m).   Weight as of this encounter: 67.6 kg (149 lb). Up with therapy Discharge home with home health Diet - Regular diet Follow up - in 2 weeks Activity - WBAT Disposition - Home Condition Upon Discharge - Good D/C Meds - See DC Summary DVT Prophylaxis - Xarelto  Arlee Muslim, PA-C Orthopaedic Surgery 02/14/2016, 7:42 AM

## 2016-02-14 NOTE — Discharge Summary (Signed)
Physician Discharge Summary   Patient ID: Kiara [name] MRN: [id number] DOB/AGE: May 30, 1934 81 y.o.  Admit date: 02/12/2016 Discharge date: 02-14-2016  Primary Diagnosis:  Osteoarthritis  Right knee(s)  Admission Diagnoses:  Past Medical History:  Diagnosis Date  . Arthritis    osteoarthritis -right knee.  . C. difficile colitis    past,no recent issues  . Colitis   . Diverticulitis   . Fall    "tripped"-10 days ago-scrapped right knee and lateral right brow area-" healing"  . GERD (gastroesophageal reflux disease)    occ.  . Seasonal allergies    Discharge Diagnoses:   Principal Problem:   OA (osteoarthritis) of knee  Estimated body mass index is 25.58 kg/m as calculated from the following:   Height as of this encounter: 5' 4"  (1.626 m).   Weight as of this encounter: 67.6 kg (149 lb).  Procedure:  Procedure(s) (LRB): RIGHT TOTAL KNEE ARTHROPLASTY (Right)   Consults: None  HPI: Kiara [name] is a 81 y.o. year old female with end stage OA of her right knee with progressively worsening pain and dysfunction. She has constant pain, with activity and at rest and significant functional deficits with difficulties even with ADLs. She has had extensive non-op management including analgesics, injections of cortisone and viscosupplements, and home exercise program, but remains in significant pain with significant dysfunction.Radiographs show bone on bone arthritis lateral and patellofemoral. She presents now for right Total Knee Arthroplasty.   Laboratory Data: Admission on 02/12/2016  Component Date Value Ref Range Status  . WBC 02/13/2016 8.2  4.0 - 10.5 K/uL Final  . RBC 02/13/2016 3.53* 3.87 - 5.11 MIL/uL Final  . Hemoglobin 02/13/2016 11.5* 12.0 - 15.0 g/dL Final  . HCT 02/13/2016 33.4* 36.0 - 46.0 % Final  . MCV 02/13/2016 94.6  78.0 - 100.0 fL Final  . MCH 02/13/2016 32.6  26.0 - 34.0 pg Final  . MCHC 02/13/2016 34.4  30.0 - 36.0 g/dL Final  . RDW 02/13/2016  12.2  11.5 - 15.5 % Final  . Platelets 02/13/2016 223  150 - 400 K/uL Final  . Sodium 02/13/2016 133* 135 - 145 mmol/L Final  . Potassium 02/13/2016 4.1  3.5 - 5.1 mmol/L Final  . Chloride 02/13/2016 101  101 - 111 mmol/L Final  . CO2 02/13/2016 24  22 - 32 mmol/L Final  . Glucose, Bld 02/13/2016 135* 65 - 99 mg/dL Final  . BUN 02/13/2016 14  6 - 20 mg/dL Final  . Creatinine, Ser 02/13/2016 0.75  0.44 - 1.00 mg/dL Final  . Calcium 02/13/2016 9.0  8.9 - 10.3 mg/dL Final  . GFR calc non Af Amer 02/13/2016 >60  >60 mL/min Final  . GFR calc Af Amer 02/13/2016 >60  >60 mL/min Final   Comment: (NOTE) The eGFR has been calculated using the CKD EPI equation. This calculation has not been validated in all clinical situations. eGFR's persistently <60 mL/min signify possible Chronic Kidney Disease.   . Anion gap 02/13/2016 8  5 - 15 Final  . WBC 02/14/2016 9.2  4.0 - 10.5 K/uL Final  . RBC 02/14/2016 3.01* 3.87 - 5.11 MIL/uL Final  . Hemoglobin 02/14/2016 10.0* 12.0 - 15.0 g/dL Final  . HCT 02/14/2016 29.5* 36.0 - 46.0 % Final  . MCV 02/14/2016 98.0  78.0 - 100.0 fL Final  . MCH 02/14/2016 33.2  26.0 - 34.0 pg Final  . MCHC 02/14/2016 33.9  30.0 - 36.0 g/dL Final  . RDW 02/14/2016 12.8  11.5 -  15.5 % Final  . Platelets 02/14/2016 234  150 - 400 K/uL Final  . Sodium 02/14/2016 135  135 - 145 mmol/L Final  . Potassium 02/14/2016 4.0  3.5 - 5.1 mmol/L Final  . Chloride 02/14/2016 104  101 - 111 mmol/L Final  . CO2 02/14/2016 25  22 - 32 mmol/L Final  . Glucose, Bld 02/14/2016 117* 65 - 99 mg/dL Final  . BUN 02/14/2016 16  6 - 20 mg/dL Final  . Creatinine, Ser 02/14/2016 0.74  0.44 - 1.00 mg/dL Final  . Calcium 02/14/2016 9.2  8.9 - 10.3 mg/dL Final  . GFR calc non Af Amer 02/14/2016 >60  >60 mL/min Final  . GFR calc Af Amer 02/14/2016 >60  >60 mL/min Final   Comment: (NOTE) The eGFR has been calculated using the CKD EPI equation. This calculation has not been validated in all clinical  situations. eGFR's persistently <60 mL/min signify possible Chronic Kidney Disease.   Georgiann Hahn gap 02/14/2016 6  5 - 15 Final  Hospital Outpatient Visit on 02/04/2016  Component Date Value Ref Range Status  . MRSA, PCR 02/04/2016 NEGATIVE  NEGATIVE Final  . Staphylococcus aureus 02/04/2016 NEGATIVE  NEGATIVE Final   Comment:        The Xpert SA Assay (FDA approved for NASAL specimens in patients over 36 years of age), is one component of a comprehensive surveillance program.  Test performance has been validated by  [facility] for patients greater than or equal to 5 year old. It is not intended to diagnose infection nor to guide or monitor treatment.   Marland Kitchen aPTT 02/04/2016 25  24 - 36 seconds Final  . WBC 02/04/2016 3.6* 4.0 - 10.5 K/uL Final  . RBC 02/04/2016 3.88  3.87 - 5.11 MIL/uL Final  . Hemoglobin 02/04/2016 12.6  12.0 - 15.0 g/dL Final  . HCT 02/04/2016 37.1  36.0 - 46.0 % Final  . MCV 02/04/2016 95.6  78.0 - 100.0 fL Final  . MCH 02/04/2016 32.5  26.0 - 34.0 pg Final  . MCHC 02/04/2016 34.0  30.0 - 36.0 g/dL Final  . RDW 02/04/2016 12.5  11.5 - 15.5 % Final  . Platelets 02/04/2016 240  150 - 400 K/uL Final  . Sodium 02/04/2016 138  135 - 145 mmol/L Final  . Potassium 02/04/2016 4.6  3.5 - 5.1 mmol/L Final  . Chloride 02/04/2016 104  101 - 111 mmol/L Final  . CO2 02/04/2016 26  22 - 32 mmol/L Final  . Glucose, Bld 02/04/2016 97  65 - 99 mg/dL Final  . BUN 02/04/2016 19  6 - 20 mg/dL Final  . Creatinine, Ser 02/04/2016 0.83  0.44 - 1.00 mg/dL Final  . Calcium 02/04/2016 9.8  8.9 - 10.3 mg/dL Final  . Total Protein 02/04/2016 7.8  6.5 - 8.1 g/dL Final  . Albumin 02/04/2016 4.6  3.5 - 5.0 g/dL Final  . AST 02/04/2016 18  15 - 41 U/L Final  . ALT 02/04/2016 21  14 - 54 U/L Final  . Alkaline Phosphatase 02/04/2016 60  38 - 126 U/L Final  . Total Bilirubin 02/04/2016 0.7  0.3 - 1.2 mg/dL Final  . GFR calc non Af Amer 02/04/2016 >60  >60 mL/min Final  . GFR calc Af Amer  02/04/2016 >60  >60 mL/min Final   Comment: (NOTE) The eGFR has been calculated using the CKD EPI equation. This calculation has not been validated in all clinical situations. eGFR's persistently <60 mL/min signify possible Chronic Kidney Disease.   Marland Kitchen  Anion gap 02/04/2016 8  5 - 15 Final  . Prothrombin Time 02/04/2016 13.1  11.4 - 15.2 seconds Final  . INR 02/04/2016 0.99   Final  . ABO/RH(D) 02/12/2016 A NEG   Final  . Antibody Screen 02/12/2016 NEG   Final  . Sample Expiration 02/12/2016 02/15/2016   Final  . Extend sample reason 02/12/2016 NO TRANSFUSIONS OR PREGNANCY IN THE PAST 3 MONTHS   Final  . Color, Urine 02/04/2016 YELLOW  YELLOW Final  . APPearance 02/04/2016 CLEAR  CLEAR Final  . Specific Gravity, Urine 02/04/2016 1.009  1.005 - 1.030 Final  . pH 02/04/2016 6.0  5.0 - 8.0 Final  . Glucose, UA 02/04/2016 NEGATIVE  NEGATIVE mg/dL Final  . Hgb urine dipstick 02/04/2016 NEGATIVE  NEGATIVE Final  . Bilirubin Urine 02/04/2016 NEGATIVE  NEGATIVE Final  . Ketones, ur 02/04/2016 NEGATIVE  NEGATIVE mg/dL Final  . Protein, ur 02/04/2016 NEGATIVE  NEGATIVE mg/dL Final  . Nitrite 02/04/2016 NEGATIVE  NEGATIVE Final  . Leukocytes, UA 02/04/2016 NEGATIVE  NEGATIVE Final  . ABO/RH(D) 02/04/2016 A NEG   Final     X-Rays:Mm Screening Breast Tomo Bilateral  Result Date: 02/06/2016 CLINICAL DATA:  Screening. EXAM: 2D DIGITAL SCREENING BILATERAL MAMMOGRAM WITH CAD AND ADJUNCT TOMO COMPARISON:  Previous exam(s). ACR Breast Density Category b: There are scattered areas of fibroglandular density. FINDINGS: There are no findings suspicious for malignancy. Images were processed with CAD. IMPRESSION: No mammographic evidence of malignancy. A result letter of this screening mammogram will be mailed directly to the patient. RECOMMENDATION: Screening mammogram in one year. (Code:SM-B-01Y) BI-RADS CATEGORY  1: Negative. Electronically Signed   By: Dorise Bullion III M.D   On: 02/06/2016 17:12     EKG: Orders placed or performed during the hospital encounter of 02/12/16  . EKG 12-Lead  . EKG 12-Lead  . EKG 12-Lead  . EKG 12-Lead     Hospital Course: Kiara [name] is a 81 y.o. who was admitted to [facility]. They were brought to the operating room on 02/12/2016 and underwent Procedure(s): RIGHT TOTAL KNEE ARTHROPLASTY.  Patient tolerated the procedure well and was later transferred to the recovery room and then to the orthopaedic floor for postoperative care.  They were given PO and IV analgesics for pain control following their surgery.  They were given 24 hours of postoperative antibiotics of  Anti-infectives    Start     Dose/Rate Route Frequency Ordered Stop   02/12/16 2000  ceFAZolin (ANCEF) IVPB 1 g/50 mL premix     1 g 100 mL/hr over 30 Minutes Intravenous Every 6 hours 02/12/16 1605 02/13/16 0224   02/12/16 1003  ceFAZolin (ANCEF) IVPB 2g/100 mL premix     2 g 200 mL/hr over 30 Minutes Intravenous On call to O.R. 02/12/16 1003 02/12/16 1241     and started on DVT prophylaxis in the form of Xarelto.   PT and OT were ordered for total joint protocol.  Discharge planning consulted to help with postop disposition and equipment needs.  Patient had a tough night on the evening of surgery some pain.  They started to get up OOB with therapy on day one. Hemovac drain was pulled without difficulty.  Continued to work with therapy into day two.  Dressing was changed on day two and the incision was healing well. Patient was seen in rounds and was ready to go home.  Discharge home with home health Diet - Regular diet Follow up - in 2 weeks  Activity - WBAT Disposition - Home Condition Upon Discharge - Good D/C Meds - See DC Summary DVT Prophylaxis - Xarelto  Discharge Instructions    Call MD / Call 911    Complete by:  As directed    If you experience chest pain or shortness of breath, CALL 911 and be transported to the hospital emergency room.  If you develope a  fever above 101 F, pus (white drainage) or increased drainage or redness at the wound, or calf pain, call your surgeon's office.   Change dressing    Complete by:  As directed    Change dressing daily with sterile 4 x 4 inch gauze dressing and apply TED hose. Do not submerge the incision under water.   Constipation Prevention    Complete by:  As directed    Drink plenty of fluids.  Prune juice may be helpful.  You may use a stool softener, such as Colace (over the counter) 100 mg twice a day.  Use MiraLax (over the counter) for constipation as needed.   Diet general    Complete by:  As directed    Discharge instructions    Complete by:  As directed    Pick up stool softner and laxative for home use following surgery while on pain medications. Do not submerge incision under water. Please use good hand washing techniques while changing dressing each day. May shower starting three days after surgery. Please use a clean towel to pat the incision dry following showers. Continue to use ice for pain and swelling after surgery. Do not use any lotions or creams on the incision until instructed by your surgeon.   Postoperative Constipation Protocol  Constipation - defined medically as fewer than three stools per week and severe constipation as less than one stool per week.  One of the most common issues patients have following surgery is constipation.  Even if you have a regular bowel pattern at home, your normal regimen is likely to be disrupted due to multiple reasons following surgery.  Combination of anesthesia, postoperative narcotics, change in appetite and fluid intake all can affect your bowels.  In order to avoid complications following surgery, here are some recommendations in order to help you during your recovery period.  Colace (docusate) - Pick up an over-the-counter form of Colace or another stool softener and take twice a day as long as you are requiring postoperative pain  medications.  Take with a full glass of water daily.  If you experience loose stools or diarrhea, hold the colace until you stool forms back up.  If your symptoms do not get better within 1 week or if they get worse, check with your doctor.  Dulcolax (bisacodyl) - Pick up over-the-counter and take as directed by the product packaging as needed to assist with the movement of your bowels.  Take with a full glass of water.  Use this product as needed if not relieved by Colace only.   MiraLax (polyethylene glycol) - Pick up over-the-counter to have on hand.  MiraLax is a solution that will increase the amount of water in your bowels to assist with bowel movements.  Take as directed and can mix with a glass of water, juice, soda, coffee, or tea.  Take if you go more than two days without a movement. Do not use MiraLax more than once per day. Call your doctor if you are still constipated or irregular after using this medication for 7 days in a row.  If you continue to have problems with postoperative constipation, please contact the office for further assistance and recommendations.  If you experience "the worst abdominal pain ever" or develop nausea or vomiting, please contact the office immediatly for further recommendations for treatment.   Take Xarelto for two and a half more weeks, then discontinue Xarelto. Once the patient has completed the blood thinner regimen, then take a Baby 81 mg Aspirin daily for three more weeks.   Do not put a pillow under the knee. Place it under the heel.    Complete by:  As directed    Do not sit on low chairs, stoools or toilet seats, as it may be difficult to get up from low surfaces    Complete by:  As directed    Driving restrictions    Complete by:  As directed    No driving until released by the physician.   Increase activity slowly as tolerated    Complete by:  As directed    Lifting restrictions    Complete by:  As directed    No lifting until released by  the physician.   Patient may shower    Complete by:  As directed    You may shower without a dressing once there is no drainage.  Do not wash over the wound.  If drainage remains, do not shower until drainage stops.   TED hose    Complete by:  As directed    Use stockings (TED hose) for 3 weeks on both leg(s).  You may remove them at night for sleeping.   Weight bearing as tolerated    Complete by:  As directed    Laterality:  right   Extremity:  Lower       Medication List    STOP taking these medications   HYDROcodone-acetaminophen 5-325 MG tablet Commonly known as:  NORCO   ibuprofen 200 MG tablet Commonly known as:  ADVIL,MOTRIN   OVER THE COUNTER MEDICATION     TAKE these medications   azelastine 0.1 % nasal spray Commonly known as:  ASTELIN Place 2 sprays into the nose daily after breakfast.   bismuth subsalicylate 680 SU/11SR suspension Commonly known as:  PEPTO BISMOL Take 30 mLs by mouth every 6 (six) hours as needed for indigestion.   gabapentin 100 MG capsule Commonly known as:  NEURONTIN Take one tablet by mouth at bed time.   loratadine 10 MG tablet Commonly known as:  CLARITIN Take 10 mg by mouth daily as needed for allergies.   methocarbamol 500 MG tablet Commonly known as:  ROBAXIN Take 1 tablet (500 mg total) by mouth every 6 (six) hours as needed for muscle spasms.   oxyCODONE 5 MG immediate release tablet Commonly known as:  Oxy IR/ROXICODONE Take 1-2 tablets (5-10 mg total) by mouth every 3 (three) hours as needed for moderate pain or severe pain.   PROBIOTIC DAILY PO Take 1 tablet by mouth daily.   rivaroxaban 10 MG Tabs tablet Commonly known as:  XARELTO Take 1 tablet (10 mg total) by mouth daily with breakfast. Take Xarelto for two and a half more weeks, then discontinue Xarelto. Once the patient has completed the blood thinner regimen, then take a Baby 81 mg Aspirin daily for three more weeks. Start taking on:  02/15/2016   traMADol  50 MG tablet Commonly known as:  ULTRAM Take 1-2 tablets (50-100 mg total) by mouth every 6 (six) hours as needed (mild pain). What changed:  how  much to take  reasons to take this            Durable Medical Equipment        Start     Ordered   02/13/16 0946  For home use only DME 3 n 1  Once     02/13/16 0945   02/13/16 0945  For home use only DME Walker rolling  Once    Question:  Patient needs a walker to treat with the following condition  Answer:  Knee joint replacement status, left   02/13/16 0945     Follow-up Information    [facility].   Why:  home health physical therapy and rolling walker and 3n1 Contact information: [address] [place] [address] [phone]        Gearlean Alf, MD. Schedule an appointment as soon as possible for a visit on 02/25/2016.   Specialty:  Orthopedic Surgery Contact information: [address] [place] [address] [phone]           Signed: Arlee Muslim, PA-C Orthopaedic Surgery 02/14/2016, 7:48 AM

## 2016-08-28 ENCOUNTER — Other Ambulatory Visit: Payer: Self-pay | Admitting: Gastroenterology

## 2016-08-28 DIAGNOSIS — R933 Abnormal findings on diagnostic imaging of other parts of digestive tract: Secondary | ICD-10-CM

## 2016-08-28 DIAGNOSIS — K5730 Diverticulosis of large intestine without perforation or abscess without bleeding: Secondary | ICD-10-CM

## 2016-08-28 DIAGNOSIS — R1084 Generalized abdominal pain: Secondary | ICD-10-CM

## 2016-09-02 ENCOUNTER — Ambulatory Visit
Admission: RE | Admit: 2016-09-02 | Discharge: 2016-09-02 | Disposition: A | Discharge status: Home / Self Care | Payer: Medicare Other | Source: Ambulatory Visit | Attending: Gastroenterology | Admitting: Gastroenterology

## 2016-09-02 DIAGNOSIS — R1084 Generalized abdominal pain: Secondary | ICD-10-CM

## 2016-09-02 DIAGNOSIS — R933 Abnormal findings on diagnostic imaging of other parts of digestive tract: Secondary | ICD-10-CM

## 2016-09-02 DIAGNOSIS — K5730 Diverticulosis of large intestine without perforation or abscess without bleeding: Secondary | ICD-10-CM

## 2016-09-02 MED ORDER — IOPAMIDOL (ISOVUE-300) INJECTION 61%
100.0000 mL | Freq: Once | INTRAVENOUS | Status: AC | PRN
  Administered 2016-09-02: 100 mL via INTRAVENOUS

## 2016-11-04 DIAGNOSIS — I10 Essential (primary) hypertension: Secondary | ICD-10-CM | POA: Insufficient documentation

## 2016-12-15 DIAGNOSIS — Z0000 Encounter for general adult medical examination without abnormal findings: Secondary | ICD-10-CM | POA: Insufficient documentation

## 2017-02-23 ENCOUNTER — Other Ambulatory Visit: Payer: Self-pay | Admitting: Internal Medicine

## 2017-02-23 DIAGNOSIS — Z1231 Encounter for screening mammogram for malignant neoplasm of breast: Secondary | ICD-10-CM

## 2017-03-16 ENCOUNTER — Ambulatory Visit
Admission: RE | Admit: 2017-03-16 | Discharge: 2017-03-16 | Disposition: A | Discharge status: Home / Self Care | Payer: Medicare Other | Source: Ambulatory Visit | Attending: Internal Medicine | Admitting: Internal Medicine

## 2017-03-16 DIAGNOSIS — Z1231 Encounter for screening mammogram for malignant neoplasm of breast: Secondary | ICD-10-CM | POA: Diagnosis present

## 2017-08-03 DIAGNOSIS — H25019 Cortical age-related cataract, unspecified eye: Secondary | ICD-10-CM | POA: Insufficient documentation

## 2017-08-03 DIAGNOSIS — K529 Noninfective gastroenteritis and colitis, unspecified: Secondary | ICD-10-CM | POA: Insufficient documentation

## 2017-08-04 ENCOUNTER — Encounter

## 2017-08-04 ENCOUNTER — Ambulatory Visit: Payer: Medicare Other | Admitting: Podiatry

## 2017-08-04 ENCOUNTER — Ambulatory Visit (INDEPENDENT_AMBULATORY_CARE_PROVIDER_SITE_OTHER): Payer: Medicare Other

## 2017-08-04 ENCOUNTER — Encounter: Payer: Self-pay | Admitting: Podiatry

## 2017-08-04 VITALS — BP 123/58 | HR 67 | Resp 16

## 2017-08-04 DIAGNOSIS — D3613 Benign neoplasm of peripheral nerves and autonomic nervous system of lower limb, including hip: Secondary | ICD-10-CM

## 2017-08-04 DIAGNOSIS — G5760 Lesion of plantar nerve, unspecified lower limb: Secondary | ICD-10-CM

## 2017-08-04 DIAGNOSIS — G5780 Other specified mononeuropathies of unspecified lower limb: Secondary | ICD-10-CM

## 2017-08-04 NOTE — Progress Notes (Signed)
Subjective:  Patient ID: Kiara [name], female    DOB: [birth year],  MRN: [id number] HPI Chief Complaint  Patient presents with  . Foot Pain    Forefoot and 3rd and 4th toes bilateral (R>L) - tingling and numbness x several months, some numbness in shins, gets unbalanced at times, treated left foot for neuroma in 2015  . New Patient (Initial Visit)    Est pt - 32    82 y.o. female presents with the above complaint.   She denies fever chills nausea vomiting muscle aches pains calf pain back pain chest pain shortness of breath.  Past Medical History:  Diagnosis Date  . Arthritis    osteoarthritis -right knee.  . C. difficile colitis    past,no recent issues  . Colitis   . Diverticulitis   . Fall    "tripped"-10 days ago-scrapped right knee and lateral right brow area-" healing"  . GERD (gastroesophageal reflux disease)    occ.  . Seasonal allergies    Past Surgical History:  Procedure Laterality Date  . APPENDECTOMY    . CATARACT EXTRACTION, BILATERAL    . EYE SURGERY     ? right eye retina repair.  Marland Kitchen HEMORRHOID SURGERY    . KNEE ARTHROSCOPY Right 11/26/2014   Procedure: ARTHROSCOPY KNEE, Tear medical horn and anterior horn. Lateral mesicus tear, grade 3 medial;  Surgeon: Dereck Leep, MD;  Location: [facility] ORS;  Service: Orthopedics;  Laterality: Right;  . MOLE REMOVAL     benign  . TONSILLECTOMY    . TOTAL KNEE ARTHROPLASTY Right 02/12/2016   Procedure: RIGHT TOTAL KNEE ARTHROPLASTY;  Surgeon: Gaynelle Arabian, MD;  Location: [facility] ORS;  Service: Orthopedics;  Laterality: Right;    Current Outpatient Medications:  .  losartan-hydrochlorothiazide (HYZAAR) 50-12.5 MG tablet, Take by mouth., Disp: , Rfl:  .  olmesartan (BENICAR) 20 MG tablet, , Disp: , Rfl:  .  Probiotic Product (PROBIOTIC DAILY PO), Take 1 tablet by mouth daily. , Disp: , Rfl:   Allergies  Allergen Reactions  . Ciprofloxacin Other (See Comments)    diarrhea   Review of Systems Objective:    Vitals:   08/04/17 1528  BP: (!) 123/58  Pulse: 67  Resp: 16    General: Well developed, nourished, in no acute distress, alert and oriented x3   Dermatological: Skin is warm, dry and supple bilateral. Nails x 10 are well maintained; remaining integument appears unremarkable at this time. There are no open sores, no preulcerative lesions, no rash or signs of infection present.  Vascular: Dorsalis Pedis artery and Posterior Tibial artery pedal pulses are 2/4 bilateral with immedate capillary fill time. Pedal hair growth present. No varicosities and no lower extremity edema present bilateral.   Neruologic: Grossly intact via light touch bilateral. Vibratory intact via tuning fork bilateral. Protective threshold with Semmes Wienstein monofilament intact to all pedal sites bilateral. Patellar and Achilles deep tendon reflexes 2+ bilateral. No Babinski or clonus noted bilateral.   Musculoskeletal: No gross boney pedal deformities bilateral. No pain, crepitus, or limitation noted with foot and ankle range of motion bilateral. Muscular strength 5/5 in all groups tested bilateral.  Gait: Unassisted, Nonantalgic.    Radiographs:  No acute findings   Assessment & Plan:   Assessment: Neuroma third interdigital space bilateral.    Plan: I injected 10 mg of Kenalog and 5 mg of Marcaine to the third interdigital space bilateral sterile Betadine skin prep tolerated procedure well we will follow-up with her in 1  month.     [name] T. [place], [place]

## 2017-09-01 ENCOUNTER — Ambulatory Visit: Payer: Medicare Other | Admitting: Podiatry

## 2017-12-16 DIAGNOSIS — K589 Irritable bowel syndrome without diarrhea: Secondary | ICD-10-CM | POA: Insufficient documentation

## 2017-12-16 DIAGNOSIS — Z8619 Personal history of other infectious and parasitic diseases: Secondary | ICD-10-CM | POA: Insufficient documentation

## 2018-02-03 ENCOUNTER — Other Ambulatory Visit: Payer: Self-pay | Admitting: Internal Medicine

## 2018-02-03 DIAGNOSIS — Z1231 Encounter for screening mammogram for malignant neoplasm of breast: Secondary | ICD-10-CM

## 2018-02-23 DIAGNOSIS — M25561 Pain in right knee: Secondary | ICD-10-CM | POA: Insufficient documentation

## 2018-03-18 ENCOUNTER — Ambulatory Visit
Admission: RE | Admit: 2018-03-18 | Discharge: 2018-03-18 | Disposition: A | Discharge status: Home / Self Care | Payer: Medicare Other | Source: Ambulatory Visit | Attending: Internal Medicine | Admitting: Internal Medicine

## 2018-03-18 DIAGNOSIS — Z1231 Encounter for screening mammogram for malignant neoplasm of breast: Secondary | ICD-10-CM | POA: Diagnosis present

## 2018-03-28 DIAGNOSIS — M654 Radial styloid tenosynovitis [de Quervain]: Secondary | ICD-10-CM | POA: Insufficient documentation

## 2019-01-20 DIAGNOSIS — H353221 Exudative age-related macular degeneration, left eye, with active choroidal neovascularization: Secondary | ICD-10-CM | POA: Insufficient documentation

## 2019-01-20 DIAGNOSIS — G609 Hereditary and idiopathic neuropathy, unspecified: Secondary | ICD-10-CM | POA: Insufficient documentation

## 2019-01-30 ENCOUNTER — Other Ambulatory Visit: Payer: Self-pay | Admitting: Internal Medicine

## 2019-01-30 DIAGNOSIS — Z87891 Personal history of nicotine dependence: Secondary | ICD-10-CM

## 2019-01-30 DIAGNOSIS — R0600 Dyspnea, unspecified: Secondary | ICD-10-CM

## 2019-01-30 DIAGNOSIS — R0609 Other forms of dyspnea: Secondary | ICD-10-CM

## 2019-02-07 ENCOUNTER — Ambulatory Visit
Admission: RE | Admit: 2019-02-07 | Discharge: 2019-02-07 | Disposition: A | Discharge status: Home / Self Care | Payer: Medicare Other | Source: Ambulatory Visit | Attending: Internal Medicine | Admitting: Internal Medicine

## 2019-02-07 ENCOUNTER — Other Ambulatory Visit: Payer: Self-pay

## 2019-02-07 DIAGNOSIS — R0609 Other forms of dyspnea: Secondary | ICD-10-CM

## 2019-02-07 DIAGNOSIS — R0600 Dyspnea, unspecified: Secondary | ICD-10-CM | POA: Insufficient documentation

## 2019-02-07 DIAGNOSIS — Z87891 Personal history of nicotine dependence: Secondary | ICD-10-CM

## 2019-02-07 MED ORDER — IOHEXOL 300 MG/ML  SOLN
75.0000 mL | Freq: Once | INTRAMUSCULAR | Status: AC | PRN
  Administered 2019-02-07: 11:00:00 75 mL via INTRAVENOUS

## 2019-02-08 DIAGNOSIS — R918 Other nonspecific abnormal finding of lung field: Secondary | ICD-10-CM | POA: Insufficient documentation

## 2019-02-09 ENCOUNTER — Other Ambulatory Visit: Payer: Self-pay | Admitting: Internal Medicine

## 2019-02-09 DIAGNOSIS — R918 Other nonspecific abnormal finding of lung field: Secondary | ICD-10-CM

## 2019-04-07 ENCOUNTER — Other Ambulatory Visit: Payer: Self-pay | Admitting: Neurology

## 2019-04-07 DIAGNOSIS — R2689 Other abnormalities of gait and mobility: Secondary | ICD-10-CM

## 2019-04-17 ENCOUNTER — Other Ambulatory Visit: Payer: Self-pay

## 2019-04-17 ENCOUNTER — Ambulatory Visit
Admission: RE | Admit: 2019-04-17 | Discharge: 2019-04-17 | Disposition: A | Discharge status: Home / Self Care | Payer: Medicare PPO | Source: Ambulatory Visit | Attending: Neurology | Admitting: Neurology

## 2019-04-17 DIAGNOSIS — R2689 Other abnormalities of gait and mobility: Secondary | ICD-10-CM | POA: Diagnosis present

## 2019-04-24 ENCOUNTER — Encounter: Payer: Self-pay | Admitting: Physical Therapy

## 2019-05-03 ENCOUNTER — Ambulatory Visit: Payer: Medicare PPO | Admitting: Physical Therapy

## 2019-05-08 ENCOUNTER — Encounter: Payer: Medicare PPO | Admitting: Physical Therapy

## 2019-05-10 ENCOUNTER — Encounter: Payer: Medicare PPO | Admitting: Physical Therapy

## 2019-05-15 ENCOUNTER — Ambulatory Visit: Payer: Medicare PPO | Attending: Neurology | Admitting: Physical Therapy

## 2019-05-15 ENCOUNTER — Other Ambulatory Visit: Payer: Self-pay

## 2019-05-15 ENCOUNTER — Encounter: Payer: Self-pay | Admitting: Physical Therapy

## 2019-05-15 DIAGNOSIS — M6281 Muscle weakness (generalized): Secondary | ICD-10-CM

## 2019-05-15 DIAGNOSIS — R262 Difficulty in walking, not elsewhere classified: Secondary | ICD-10-CM

## 2019-05-15 DIAGNOSIS — R2689 Other abnormalities of gait and mobility: Secondary | ICD-10-CM | POA: Diagnosis present

## 2019-05-15 NOTE — Therapy (Signed)
[place] [facility] SERVICES [address] [place], [place], [address] Phone: [phone]   Fax:  [phone]  Physical Therapy Evaluation  Patient Details  Name: Kiara [name] MRN: [id number] Date of Birth: 05/28/1934 Referring Provider (PT): Jennings Books   Encounter Date: 05/15/2019  PT End of Session - 05/15/19 1539    Visit Number  1    Number of Visits  17    Date for PT Re-Evaluation  07/10/19    PT Start Time  [id number]    PT Stop Time  1630    PT Time Calculation (min)  60 min    Equipment Utilized During Treatment  Gait belt    Activity Tolerance  Patient tolerated treatment well    Behavior During Therapy  WFL for tasks assessed/performed       Past Medical History:  Diagnosis Date  . Arthritis    osteoarthritis -right knee.  . C. difficile colitis    past,no recent issues  . Colitis   . Diverticulitis   . Fall    "tripped"-10 days ago-scrapped right knee and lateral right brow area-" healing"  . GERD (gastroesophageal reflux disease)    occ.  . Seasonal allergies     Past Surgical History:  Procedure Laterality Date  . APPENDECTOMY    . CATARACT EXTRACTION, BILATERAL    . EYE SURGERY     ? right eye retina repair.  Marland Kitchen HEMORRHOID SURGERY    . KNEE ARTHROSCOPY Right 11/26/2014   Procedure: ARTHROSCOPY KNEE, Tear medical horn and anterior horn. Lateral mesicus tear, grade 3 medial;  Surgeon: Dereck Leep, MD;  Location: [facility] ORS;  Service: Orthopedics;  Laterality: Right;  . MOLE REMOVAL     benign  . TONSILLECTOMY    . TOTAL KNEE ARTHROPLASTY Right 02/12/2016   Procedure: RIGHT TOTAL KNEE ARTHROPLASTY;  Surgeon: Gaynelle Arabian, MD;  Location: [facility];  Service: Orthopedics;  Laterality: Right;    There were no vitals filed for this visit.   Subjective Assessment - 05/15/19 1542    Subjective  Patient reports having difficulty with descending steps when there is not a railing, walking the dog and also walking in  the dark.    Pertinent History  Numbness and tingling in bilateral lower extremities concerning for peripheral neuropathy: Patient states she has been having burning, tingling, and numbness in her bilateral feet and toes (4th and 5th digit on the right primarily) to the distal aspect of her right leg since around 2017 or so. Symptoms came on gradually and have gotten worse. Symptoms did not start after any international travel, unusual food intake, or infections. She is not diabeticPatient reports that her balance deficits began 6 months ago. she has had 1 fall. She lives alone . She does not walk with a cane. she walks 1 mile 2 x / day with neighbors. she has been doing that for a year.    How long can you sit comfortably?  unlimited    How long can you stand comfortably?  unlimited    How long can you walk comfortably?  1 mile    Patient Stated Goals  be more secure in her balance.    Currently in Pain?  No/denies    Multiple Pain Sites  No         [facility] PT Assessment - 05/15/19 1548      Assessment   Medical Diagnosis  imbalance    Referring Provider (PT)  shah,  Hemang    Onset Date/Surgical Date  04/11/18    Hand Dominance  Right    Prior Therapy  no      Precautions   Precautions  Fall      Restrictions   Weight Bearing Restrictions  No      Balance Screen   Has the patient fallen in the past 6 months  Yes    How many times?  1    Has the patient had a decrease in activity level because of a fear of falling?   Yes    Is the patient reluctant to leave their home because of a fear of falling?   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, walk, read      Cognition   Overall Cognitive Status  Within Functional Limits for tasks assessed      Standardized Balance Assessment   Standardized Balance Assessment  Chief Technology Officer Test   Sit to Stand  Able to stand without using hands and stabilize independently    Standing Unsupported  Able to stand safely 2 minutes    Sitting with Back Unsupported but Feet Supported on Floor or Stool  Able to sit safely and securely 2 minutes    Stand to Sit  Sits safely with minimal use of hands    Transfers  Able to transfer safely, minor use of hands    Standing Unsupported with Eyes Closed  Able to stand 10 seconds safely    Standing Unsupported with Feet Together  Able to place feet together independently and stand 1 minute safely    From Standing, Reach Forward with Outstretched Arm  Can reach forward >5 cm safely (2")    From Standing Position, Pick up Object from Floor  Able to pick up shoe safely and easily    From Standing Position, Turn to Look Behind Over each Shoulder  Looks behind one side only/other side shows less weight shift    Turn 360 Degrees  Able to turn 360 degrees safely in 4 seconds or less    Standing Unsupported, Alternately Place Feet on Step/Stool  Able to stand independently and safely and complete 8 steps in 20 seconds    Standing Unsupported, One Foot in [employer] balance while stepping or standing    Standing on One Leg  Unable to try or needs assist to prevent fall    Total Score  45        PAIN: no pain reports  POSTURE: WNL   PROM/AROM: WNL  STRENGTH:  Graded on a 0-5 scale Muscle Group Left Right                          Hip Flex 4/5 4/5  Hip Abd 4/5 4/5  Hip Add -3/5 -3/5  Hip Ext 4/5 4/5      Knee Flex 4/5 4/5  Knee Ext 4/5 4/5  Ankle DF 4/5 4/5  Ankle PF 4/5 4/5   SENSATION: tingling in toes 4 and 5 R foot, numbness in B feet     FUNCTIONAL MOBILITY:  Transfers sit to stand without UE support   BALANCE: Standing Dynamic Balance  Normal Stand independently unsupported, able to weight shift and cross midline maximally   Good Stand independently unsupported, able to weight shift and cross midline moderately x  Good-/Fair+ Stand independently unsupported, able to weight shift across midline minimally   Fair Stand independently unsupported, weight shift, and reach ipsilaterally, loss of balance when crossing midline   Poor+ Able to stand with Min A and reach ipsilaterally, unable to weight shift   Poor Able to stand with Mod A and minimally reach ipsilaterally, unable to cross midline.    Static Sitting Balance  Normal Able to maintain balance against maximal resistance   Good Able to maintain balance against moderate resistance x  Good-/Fair+ Accepts minimal resistance   Fair Able to sit unsupported without balance loss and without UE support   Poor+ Able to maintain with Minimal assistance from individual or chair   Poor Unable to maintain balance-requires mod/max support from individual or chair       GAIT:Patient ambulates without AD with decreased gait speed with no deviations Steps: Needs rail assist ascending and descending   OUTCOME MEASURES: TEST Outcome Interpretation  5 times sit<>stand 15.53sec >60 yo, >15 sec indicates increased risk for falls  10 meter walk test        1.14        m/s <1.0 m/s indicates increased risk for falls; limited community ambulator  Timed up and Go     12.02            sec <14 sec indicates increased risk for falls      Berg Balance Assessment 45/56 <36/56 (100% risk for falls), 37-45 (80% risk for falls); 46-51 (>50% risk for falls); 52-55 (lower risk <25% of falls)        Treatment: Heel raises x 20 BLE Corner balance in modified tandem and head turns left and right. Pt educated throughout session about proper posture and technique with exercises. Improved exercise technique, movement at  target joints, use of target muscles after min to mod verbal, visual, tactile cues.         Objective measurements completed on examination: See above findings.              PT Education - 05/15/19 1538    Education Details  plan of care    Person(s) Educated  Patient    Methods  Explanation    Comprehension  Verbalized understanding       PT Short Term Goals - 05/15/19 1630      PT SHORT TERM GOAL #1   Title  Patient will be independent in home exercise program to improve strength/mobility for better functional independence with ADLs.    Time  4    Period  Weeks    Status  New    Target Date  06/12/19      PT SHORT TERM GOAL #2   Title  Patient will increase Berg Balance score by > 6 points to demonstrate decreased fall risk during functional activities.    Time  4    Period  Weeks    Status  New    Target Date  06/12/19        PT Long Term Goals -  05/15/19 1631      PT LONG TERM GOAL #1   Title  Patient will ascend/descend 4 stairs without rail assist independently without loss of balance to improve ability to get in/out of home.    Time  8    Period  Weeks    Status  New    Target Date  07/10/19      PT LONG TERM GOAL #2   Title  Patient will increase dynamic gait index score to >19/24 as to demonstrate reduced fall risk and improved dynamic gait balance for better safety with community/home ambulation.    Time  8    Period  Weeks    Status  New    Target Date  07/10/19             Plan - 05/15/19 1540    Clinical Impression Statement  Patient presents with decreased gait speed, decreased balance with 45/56 Berg score. Patient's main complaint is inability to participate in walking dog and walking in the dark. Patient wants to improve her balance and ability to ambulate on inclines and outdoor surfaces safely. Patient will benefit from skilled PT in order to increase gait speed, increase BLE strength, and improve dynamic standing balance to  decrease risk for falls and enable patient to participate in desired activities.   Personal Factors and Comorbidities  Age;Comorbidity 1;Comorbidity 2;Comorbidity 3+    Comorbidities  seizure,    Stability/Clinical Decision Making  Stable/Uncomplicated    Clinical Decision Making  Low    Rehab Potential  Good    PT Frequency  2x / week    PT Duration  8 weeks    PT Treatment/Interventions  Balance training;Functional mobility training;Therapeutic activities;Therapeutic exercise;Neuromuscular re-education;Patient/family education    PT Next Visit Plan  balance exercise    PT Home Exercise Plan  next visit    Consulted and Agree with Plan of Care  Patient       Patient will benefit from skilled therapeutic intervention in order to improve the following deficits and impairments:  Abnormal gait, Decreased endurance, Decreased strength, Difficulty walking, Decreased balance  Visit Diagnosis: Other abnormalities of gait and mobility  Difficulty in walking, not elsewhere classified  Muscle weakness (generalized)     Problem List Patient Active Problem List   Diagnosis Date Noted  . Cataract cortical, senile 08/03/2017  . Chronic colitis 08/03/2017  . Medicare annual wellness visit, initial 12/15/2016  . Benign essential hypertension 11/04/2016  . OA (osteoarthritis) of knee 02/12/2016  . Hyperlipidemia, mixed 10/24/2015  . Clostridium difficile colitis 01/02/2014  . Right bundle branch block 10/18/2013    Alanson Puls, [place] DPT 05/15/2019, 4:38 PM  [place] [facility] SERVICES [address] [place], [place], [address] Phone: [phone]   Fax:  [phone]  Name: Kiara [name] MRN: [id number] Date of Birth: [birth year]

## 2019-05-17 ENCOUNTER — Other Ambulatory Visit: Payer: Self-pay

## 2019-05-17 ENCOUNTER — Encounter: Payer: Self-pay | Admitting: Physical Therapy

## 2019-05-17 ENCOUNTER — Ambulatory Visit: Payer: Medicare PPO | Admitting: Physical Therapy

## 2019-05-17 DIAGNOSIS — R2689 Other abnormalities of gait and mobility: Secondary | ICD-10-CM | POA: Diagnosis not present

## 2019-05-17 DIAGNOSIS — M6281 Muscle weakness (generalized): Secondary | ICD-10-CM

## 2019-05-17 DIAGNOSIS — R262 Difficulty in walking, not elsewhere classified: Secondary | ICD-10-CM

## 2019-05-17 NOTE — Therapy (Signed)
[place] [facility] SERVICES [address] [place], [place], [address] Phone: [phone]   Fax:  [phone]  Physical Therapy Treatment  Patient Details  Name: JAEDIN MACNAMARA MRN: [id number] Date of Birth: [birth year] Referring Provider (PT): Jennings Books   Encounter Date: 05/17/2019  PT End of Session - 05/17/19 1521    Visit Number  2    Number of Visits  17    Date for PT Re-Evaluation  07/10/19    PT Start Time  1502    PT Stop Time  1545    PT Time Calculation (min)  43 min    Equipment Utilized During Treatment  Gait belt    Activity Tolerance  Patient tolerated treatment well    Behavior During Therapy  WFL for tasks assessed/performed       Past Medical History:  Diagnosis Date  . Arthritis    osteoarthritis -right knee.  . C. difficile colitis    past,no recent issues  . Colitis   . Diverticulitis   . Fall    "tripped"-10 days ago-scrapped right knee and lateral right brow area-" healing"  . GERD (gastroesophageal reflux disease)    occ.  . Seasonal allergies     Past Surgical History:  Procedure Laterality Date  . APPENDECTOMY    . CATARACT EXTRACTION, BILATERAL    . EYE SURGERY     ? right eye retina repair.  Marland Kitchen HEMORRHOID SURGERY    . KNEE ARTHROSCOPY Right 11/26/2014   Procedure: ARTHROSCOPY KNEE, Tear medical horn and anterior horn. Lateral mesicus tear, grade 3 medial;  Surgeon: Dereck Leep, MD;  Location: [facility] ORS;  Service: Orthopedics;  Laterality: Right;  . MOLE REMOVAL     benign  . TONSILLECTOMY    . TOTAL KNEE ARTHROPLASTY Right 02/12/2016   Procedure: RIGHT TOTAL KNEE ARTHROPLASTY;  Surgeon: Gaynelle Arabian, MD;  Location: [facility];  Service: Orthopedics;  Laterality: Right;    There were no vitals filed for this visit.  Subjective Assessment - 05/17/19 1520    Subjective  Patient is not having any pain or any new concerns today.    Pertinent History  Numbness and tingling in bilateral lower  extremities concerning for peripheral neuropathy: Patient states she has been having burning, tingling, and numbness in her bilateral feet and toes (4th and 5th digit on the right primarily) to the distal aspect of her right leg since around 2017 or so. Symptoms came on gradually and have gotten worse. Symptoms did not start after any international travel, unusual food intake, or infections. She is not diabeticPatient reports that her balance deficits began 6 months ago. she has had 1 fall. She lives alone . She does not walk with a cane. she walks 1 mile 2 x / day with neighbors. she has been doing that for a year.    How long can you sit comfortably?  unlimited    How long can you stand comfortably?  unlimited    How long can you walk comfortably?  1 mile    Patient Stated Goals  be more secure in her balance.      Octane fitness x 5 mins L2   Neuromuscular Re-education  Stepping on balance beam  side to side x 2 laps each direction Tandem on foam and stool without UE support x 2 lengths Side stepping foam to stool without UE support x 2 lengths Heel/toe raises without UE support 3s hold x 10 each 1/2  foam roll balance with flat side up 30s x 2 reps 1/2 foam roll balance with flat side down 30s x 2 reps 1/2 foam roll tandem balance alternating forward LE 30s x 2 each LE forward Lateral side steps from foam to 6 inch stool left and right x 15 Backwards stepping from foam to 6 inch stool x 15  Standing tandem one LE on foam, one on stool and trunk rotation x 20  Patient performed with instruction, verbal cues, tactile cues of therapist: goal: increase tissue extensibility, promote proper posture, improve mobility       Pt educated throughout session about proper posture and technique with exercises. Improved exercise technique, movement at target joints, use of target muscles after min to mod verbal, visual, tactile cues. CGA and Min to mod verbal cues used throughout with increased in  postural sway and LOB most seen with narrow base of support and while on uneven surfaces.                       PT Education - 05/17/19 1521    Education Details  HEP    Person(s) Educated  Patient    Methods  Explanation    Comprehension  Verbalized understanding;Returned demonstration       PT Short Term Goals - 05/15/19 1630      PT SHORT TERM GOAL #1   Title  Patient will be independent in home exercise program to improve strength/mobility for better functional independence with ADLs.    Time  4    Period  Weeks    Status  New    Target Date  06/12/19      PT SHORT TERM GOAL #2   Title  Patient will increase Berg Balance score by > 6 points to demonstrate decreased fall risk during functional activities.    Time  4    Period  Weeks    Status  New    Target Date  06/12/19        PT Long Term Goals - 05/15/19 1631      PT LONG TERM GOAL #1   Title  Patient will ascend/descend 4 stairs without rail assist independently without loss of balance to improve ability to get in/out of home.    Time  8    Period  Weeks    Status  New    Target Date  07/10/19      PT LONG TERM GOAL #2   Title  Patient will increase dynamic gait index score to >19/24 as to demonstrate reduced fall risk and improved dynamic gait balance for better safety with community/home ambulation.    Time  8    Period  Weeks    Status  New    Target Date  07/10/19            Plan - 05/17/19 1535    Clinical Impression Statement  Patient instructed in intermediate strengthening and balance exercise.  Patient requires min Vcs for correct exercise technique including to improve LE control with standing exercise. Patient demonstrates better quad control with SLS tasks with rail assist. Patient would benefit from additional skilled PT intervention to improve balance/gait safety and reduce fall risk.   Personal Factors and Comorbidities  Age;Comorbidity 1;Comorbidity 2;Comorbidity 3+     Comorbidities  seizure,    Stability/Clinical Decision Making  Stable/Uncomplicated    Rehab Potential  Good    PT Frequency  2x / week  PT Duration  8 weeks    PT Treatment/Interventions  Balance training;Functional mobility training;Therapeutic activities;Therapeutic exercise;Neuromuscular re-education;Patient/family education    PT Next Visit Plan  balance exercise    PT Home Exercise Plan  next visit    Consulted and Agree with Plan of Care  Patient       Patient will benefit from skilled therapeutic intervention in order to improve the following deficits and impairments:  Abnormal gait, Decreased endurance, Decreased strength, Difficulty walking, Decreased balance  Visit Diagnosis: Other abnormalities of gait and mobility  Difficulty in walking, not elsewhere classified  Muscle weakness (generalized)     Problem List Patient Active Problem List   Diagnosis Date Noted  . Cataract cortical, senile 08/03/2017  . Chronic colitis 08/03/2017  . Medicare annual wellness visit, initial 12/15/2016  . Benign essential hypertension 11/04/2016  . OA (osteoarthritis) of knee 02/12/2016  . Hyperlipidemia, mixed 10/24/2015  . Clostridium difficile colitis 01/02/2014  . Right bundle branch block 10/18/2013    Alanson Puls 05/17/2019, 3:36 PM  [place] [facility] SERVICES [address] [place], [place], [address] Phone: [phone]   Fax:  [phone]  Name: JORETTA SCHALOW MRN: [id number] Date of Birth: [birth year]

## 2019-05-22 ENCOUNTER — Encounter: Payer: Self-pay | Admitting: Physical Therapy

## 2019-05-22 ENCOUNTER — Ambulatory Visit: Payer: Medicare PPO | Admitting: Physical Therapy

## 2019-05-22 ENCOUNTER — Other Ambulatory Visit: Payer: Self-pay

## 2019-05-22 DIAGNOSIS — M6281 Muscle weakness (generalized): Secondary | ICD-10-CM

## 2019-05-22 DIAGNOSIS — R2689 Other abnormalities of gait and mobility: Secondary | ICD-10-CM

## 2019-05-22 DIAGNOSIS — R262 Difficulty in walking, not elsewhere classified: Secondary | ICD-10-CM

## 2019-05-22 NOTE — Therapy (Signed)
[place] MAIN [facility] SERVICES [address] [place], [place], [address] Phone: [phone]   Fax:  [phone]  Physical Therapy Treatment  Patient Details  Name: MANNAT THUMANN MRN: [id number] Date of Birth: 1934-10-27 Referring Provider (PT): Jennings Books   Encounter Date: 05/22/2019  PT End of Session - 05/22/19 1703    Visit Number  3    Number of Visits  17    Date for PT Re-Evaluation  07/10/19    PT Start Time  1430    PT Stop Time  1515    PT Time Calculation (min)  45 min    Equipment Utilized During Treatment  Gait belt    Activity Tolerance  Patient tolerated treatment well    Behavior During Therapy  WFL for tasks assessed/performed       Past Medical History:  Diagnosis Date  . Arthritis    osteoarthritis -right knee.  . C. difficile colitis    past,no recent issues  . Colitis   . Diverticulitis   . Fall    "tripped"-10 days ago-scrapped right knee and lateral right brow area-" healing"  . GERD (gastroesophageal reflux disease)    occ.  . Seasonal allergies     Past Surgical History:  Procedure Laterality Date  . APPENDECTOMY    . CATARACT EXTRACTION, BILATERAL    . EYE SURGERY     ? right eye retina repair.  Marland Kitchen HEMORRHOID SURGERY    . KNEE ARTHROSCOPY Right 11/26/2014   Procedure: ARTHROSCOPY KNEE, Tear medical horn and anterior horn. Lateral mesicus tear, grade 3 medial;  Surgeon: Dereck Leep, MD;  Location: [facility] ORS;  Service: Orthopedics;  Laterality: Right;  . MOLE REMOVAL     benign  . TONSILLECTOMY    . TOTAL KNEE ARTHROPLASTY Right 02/12/2016   Procedure: RIGHT TOTAL KNEE ARTHROPLASTY;  Surgeon: Gaynelle Arabian, MD;  Location: [facility];  Service: Orthopedics;  Laterality: Right;    There were no vitals filed for this visit.  Subjective Assessment - 05/22/19 1701    Subjective  Patient reports no pain, falls or problems since her last visit.  We discussed stairs at today's visit and she states that  she has no stairs at her home, but that she is hesitant to descend stairs at her daughter's home outside the house.  "The steps are stone and I am afraid when I first start to go down the steps."    Pertinent History  Numbness and tingling in bilateral lower extremities concerning for peripheral neuropathy: Patient states she has been having burning, tingling, and numbness in her bilateral feet and toes (4th and 5th digit on the right primarily) to the distal aspect of her right leg since around 2017 or so. Symptoms came on gradually and have gotten worse. Symptoms did not start after any international travel, unusual food intake, or infections. She is not diabeticPatient reports that her balance deficits began 6 months ago. she has had 1 fall. She lives alone . She does not walk with a cane. she walks 1 mile 2 x / day with neighbors. she has been doing that for a year.    How long can you sit comfortably?  unlimited    How long can you stand comfortably?  unlimited    How long can you walk comfortably?  1 mile    Patient Stated Goals  be more secure in her balance.    Currently in Pain?  No/denies  Multiple Pain Sites  No       Treatment:  NMR:  Tandem stance without UE support beside rail; then tandem stance on blue half foam roll beside rail without UE support; SLS on blue foam AirEx beside rail without UE support; tandem gait in // bars without UE support; alt toe tapping on 6" step without UE support; ant/post weightshifting on blue foam pad without UE support; narrow BOS on blue foam pad with head turns to R and L.  Therapeutic Activities:  IT trainer with verbal cueing for safety; 5 trials without UE support.  Pt had no LOB with stair training today.  She did acknowledge that she felt more confident on gym stairs than on her daughter's stone stairs.                        PT Education - 05/22/19 1703    Education Details  Standing tandem stance at kitchen counter  on pillow, SLS on pillow at kitchen counter, tandem gait at [employer]) Educated  Patient    Methods  Explanation;Demonstration;Verbal cues    Comprehension  Verbalized understanding;Returned demonstration       PT Short Term Goals - 05/15/19 1630      PT SHORT TERM GOAL #1   Title  Patient will be independent in home exercise program to improve strength/mobility for better functional independence with ADLs.    Time  4    Period  Weeks    Status  New    Target Date  06/12/19      PT SHORT TERM GOAL #2   Title  Patient will increase Berg Balance score by > 6 points to demonstrate decreased fall risk during functional activities.    Time  4    Period  Weeks    Status  New    Target Date  06/12/19        PT Long Term Goals - 05/15/19 1631      PT LONG TERM GOAL #1   Title  Patient will ascend/descend 4 stairs without rail assist independently without loss of balance to improve ability to get in/out of home.    Time  8    Period  Weeks    Status  New    Target Date  07/10/19      PT LONG TERM GOAL #2   Title  Patient will increase dynamic gait index score to >19/24 as to demonstrate reduced fall risk and improved dynamic gait balance for better safety with community/home ambulation.    Time  8    Period  Weeks    Status  New    Target Date  07/10/19            Plan - 05/22/19 1704    Personal Factors and Comorbidities  Age;Comorbidity 1;Comorbidity 2;Comorbidity 3+    Comorbidities  seizure,    Stability/Clinical Decision Making  Stable/Uncomplicated    Clinical Decision Making  Moderate    Rehab Potential  Good    PT Frequency  2x / week    PT Duration  8 weeks    PT Treatment/Interventions  Balance training;Functional mobility training;Therapeutic activities;Therapeutic exercise;Neuromuscular re-education;Patient/family education    PT Next Visit Plan  balance exercise    PT Home Exercise Plan  tandem stance on pillow; tandem gait at kitchen  counter; SLS on pillow    Consulted and Agree with Plan of Care  Patient  Patient will benefit from skilled therapeutic intervention in order to improve the following deficits and impairments:  Abnormal gait, Decreased endurance, Decreased strength, Difficulty walking, Decreased balance  Visit Diagnosis: Other abnormalities of gait and mobility  Difficulty in walking, not elsewhere classified  Muscle weakness (generalized)     Problem List Patient Active Problem List   Diagnosis Date Noted  . Cataract cortical, senile 08/03/2017  . Chronic colitis 08/03/2017  . Medicare annual wellness visit, initial 12/15/2016  . Benign essential hypertension 11/04/2016  . OA (osteoarthritis) of knee 02/12/2016  . Hyperlipidemia, mixed 10/24/2015  . Clostridium difficile colitis 01/02/2014  . Right bundle branch block 10/18/2013    Quinnetta Roepke, MPT 05/22/2019, 5:08 PM  [place] MAIN [facility] SERVICES [address] [place], [place], [address] Phone: [phone]   Fax:  [phone]  Name: SELENE HOLLINGER MRN: [id number] Date of Birth: 07-06-1934

## 2019-05-24 ENCOUNTER — Encounter: Payer: Self-pay | Admitting: Physical Therapy

## 2019-05-24 ENCOUNTER — Ambulatory Visit: Payer: Medicare PPO | Admitting: Physical Therapy

## 2019-05-24 ENCOUNTER — Other Ambulatory Visit: Payer: Self-pay

## 2019-05-24 DIAGNOSIS — R262 Difficulty in walking, not elsewhere classified: Secondary | ICD-10-CM

## 2019-05-24 DIAGNOSIS — R2689 Other abnormalities of gait and mobility: Secondary | ICD-10-CM | POA: Diagnosis not present

## 2019-05-24 DIAGNOSIS — M6281 Muscle weakness (generalized): Secondary | ICD-10-CM

## 2019-05-24 NOTE — Therapy (Signed)
[place] MAIN [facility] SERVICES [address] [place], [place], [address] Phone: [phone]   Fax:  [phone]  Physical Therapy Treatment  Patient Details  Name: Kiara [name] MRN: [id number] Date of Birth: Jun 22, 1934 Referring Provider (PT): Jennings Books   Encounter Date: 05/24/2019  PT End of Session - 05/24/19 1649    Visit Number  4    Number of Visits  17    Date for PT Re-Evaluation  07/10/19    PT Start Time  0400    PT Stop Time  0445    PT Time Calculation (min)  45 min    Equipment Utilized During Treatment  Gait belt    Activity Tolerance  Patient tolerated treatment well    Behavior During Therapy  WFL for tasks assessed/performed       Past Medical History:  Diagnosis Date  . Arthritis    osteoarthritis -right knee.  . C. difficile colitis    past,no recent issues  . Colitis   . Diverticulitis   . Fall    "tripped"-10 days ago-scrapped right knee and lateral right brow area-" healing"  . GERD (gastroesophageal reflux disease)    occ.  . Seasonal allergies     Past Surgical History:  Procedure Laterality Date  . APPENDECTOMY    . CATARACT EXTRACTION, BILATERAL    . EYE SURGERY     ? right eye retina repair.  Marland Kitchen HEMORRHOID SURGERY    . KNEE ARTHROSCOPY Right 11/26/2014   Procedure: ARTHROSCOPY KNEE, Tear medical horn and anterior horn. Lateral mesicus tear, grade 3 medial;  Surgeon: Dereck Leep, MD;  Location: [facility] ORS;  Service: Orthopedics;  Laterality: Right;  . MOLE REMOVAL     benign  . TONSILLECTOMY    . TOTAL KNEE ARTHROPLASTY Right 02/12/2016   Procedure: RIGHT TOTAL KNEE ARTHROPLASTY;  Surgeon: Gaynelle Arabian, MD;  Location: [facility];  Service: Orthopedics;  Laterality: Right;    There were no vitals filed for this visit.  Subjective Assessment - 05/24/19 1647    Subjective  Pt reports compliance with her kitchen sink balance exercises.  She reports no falls since her last visit. She states that  she definitely can tell that her balance is improving since beginning PT.    Pertinent History  Numbness and tingling in bilateral lower extremities concerning for peripheral neuropathy: Patient states she has been having burning, tingling, and numbness in her bilateral feet and toes (4th and 5th digit on the right primarily) to the distal aspect of her right leg since around 2017 or so. Symptoms came on gradually and have gotten worse. Symptoms did not start after any international travel, unusual food intake, or infections. She is not diabeticPatient reports that her balance deficits began 6 months ago. she has had 1 fall. She lives alone . She does not walk with a cane. she walks 1 mile 2 x / day with neighbors. she has been doing that for a year.    How long can you sit comfortably?  unlimited    How long can you stand comfortably?  unlimited    How long can you walk comfortably?  1 mile    Patient Stated Goals  be more secure in her balance.    Currently in Pain?  No/denies         Treatment:  Therapeutic Ex:  [employer] 8 min; mini squats 20x; heel/toe raises 20x  NMR: Alt toe tapping on edge of stairs  without UE's and SBA; tandem stance on level surface, and on half foam roller with flat side up and flat side down without UE's; balancing on half foam roller with round side down at bar without UE's; toe tapping from standing on foam airex to green dynadisc without UE's at bar; narrow BOS on airex pad with head turns R/L and with EO/EC; transitioned from foam to level surface (floor) with eye closed/narrow BOS activity with arms across torso.  Ambulation in gym with increased gait speed and head turns.                      PT Education - 05/24/19 1648    Education Details  Added pillow to tandem stance and SLS at kitchen counter; braiding at [employer]) Educated  Patient    Methods  Explanation;Demonstration    Comprehension  Verbalized  understanding;Returned demonstration       PT Short Term Goals - 05/15/19 1630      PT SHORT TERM GOAL #1   Title  Patient will be independent in home exercise program to improve strength/mobility for better functional independence with ADLs.    Time  4    Period  Weeks    Status  New    Target Date  06/12/19      PT SHORT TERM GOAL #2   Title  Patient will increase Berg Balance score by > 6 points to demonstrate decreased fall risk during functional activities.    Time  4    Period  Weeks    Status  New    Target Date  06/12/19        PT Long Term Goals - 05/15/19 1631      PT LONG TERM GOAL #1   Title  Patient will ascend/descend 4 stairs without rail assist independently without loss of balance to improve ability to get in/out of home.    Time  8    Period  Weeks    Status  New    Target Date  07/10/19      PT LONG TERM GOAL #2   Title  Patient will increase dynamic gait index score to >19/24 as to demonstrate reduced fall risk and improved dynamic gait balance for better safety with community/home ambulation.    Time  8    Period  Weeks    Status  New    Target Date  07/10/19            Plan - 05/24/19 1649    Clinical Impression Statement  Pt had mod-sev side and posterior sway and hip recovery strategy with eyes closed on foam today.  Sway improved with removal of unstable surface and practice.  She demonstrated improved static and dynamic balance with other NMR activities today.  Discussed with pt how eyes closed activities mimic low lighting and unstable surfaces causing increased fall risk.  Continue with NMR activities at next vist.    Personal Factors and Comorbidities  Age;Comorbidity 1;Comorbidity 2;Comorbidity 3+    Comorbidities  seizure,    Stability/Clinical Decision Making  Stable/Uncomplicated    Clinical Decision Making  Moderate    Rehab Potential  Good    PT Frequency  2x / week    PT Duration  8 weeks    PT Treatment/Interventions  Balance  training;Functional mobility training;Therapeutic activities;Therapeutic exercise;Neuromuscular re-education;Patient/family education    PT Next Visit Plan  balance exercise    PT Home Exercise  Plan  tandem stance on pillow; tandem gait at kitchen counter; SLS on pillow    Consulted and Agree with Plan of Care  Patient       Patient will benefit from skilled therapeutic intervention in order to improve the following deficits and impairments:  Abnormal gait, Decreased endurance, Decreased strength, Difficulty walking, Decreased balance  Visit Diagnosis: Other abnormalities of gait and mobility  Difficulty in walking, not elsewhere classified  Muscle weakness (generalized)     Problem List Patient Active Problem List   Diagnosis Date Noted  . Cataract cortical, senile 08/03/2017  . Chronic colitis 08/03/2017  . Medicare annual wellness visit, initial 12/15/2016  . Benign essential hypertension 11/04/2016  . OA (osteoarthritis) of knee 02/12/2016  . Hyperlipidemia, mixed 10/24/2015  . Clostridium difficile colitis 01/02/2014  . Right bundle branch block 10/18/2013    Kamaryn Grimley, MPT 05/24/2019, 4:52 PM  [place] MAIN [facility] SERVICES [address] [place], [place], [address] Phone: [phone]   Fax:  [phone]  Name: Kiara [name] MRN: [id number] Date of Birth: July 09, 1934

## 2019-05-29 ENCOUNTER — Ambulatory Visit: Payer: Medicare PPO | Attending: Neurology

## 2019-05-29 ENCOUNTER — Other Ambulatory Visit: Payer: Self-pay

## 2019-05-29 DIAGNOSIS — M6281 Muscle weakness (generalized): Secondary | ICD-10-CM | POA: Insufficient documentation

## 2019-05-29 DIAGNOSIS — R2689 Other abnormalities of gait and mobility: Secondary | ICD-10-CM | POA: Insufficient documentation

## 2019-05-29 DIAGNOSIS — R262 Difficulty in walking, not elsewhere classified: Secondary | ICD-10-CM | POA: Diagnosis present

## 2019-05-29 NOTE — Therapy (Signed)
[place] MAIN [facility] SERVICES [address] [place], [place], [address] Phone: [phone]   Fax:  [phone]  Physical Therapy Treatment  Patient Details  Name: Kiara [name] MRN: [id number] Date of Birth: 07/09/1934 Referring Provider (PT): Jennings Books   Encounter Date: 05/29/2019  PT End of Session - 05/29/19 1439    Visit Number  5    Number of Visits  17    Date for PT Re-Evaluation  07/10/19    PT Start Time  [id number]    PT Stop Time  1514    PT Time Calculation (min)  40 min    Equipment Utilized During Treatment  Gait belt    Activity Tolerance  Patient tolerated treatment well    Behavior During Therapy  WFL for tasks assessed/performed       Past Medical History:  Diagnosis Date  . Arthritis    osteoarthritis -right knee.  . C. difficile colitis    past,no recent issues  . Colitis   . Diverticulitis   . Fall    "tripped"-10 days ago-scrapped right knee and lateral right brow area-" healing"  . GERD (gastroesophageal reflux disease)    occ.  . Seasonal allergies     Past Surgical History:  Procedure Laterality Date  . APPENDECTOMY    . CATARACT EXTRACTION, BILATERAL    . EYE SURGERY     ? right eye retina repair.  Marland Kitchen HEMORRHOID SURGERY    . KNEE ARTHROSCOPY Right 11/26/2014   Procedure: ARTHROSCOPY KNEE, Tear medical horn and anterior horn. Lateral mesicus tear, grade 3 medial;  Surgeon: Dereck Leep, MD;  Location: [facility] ORS;  Service: Orthopedics;  Laterality: Right;  . MOLE REMOVAL     benign  . TONSILLECTOMY    . TOTAL KNEE ARTHROPLASTY Right 02/12/2016   Procedure: RIGHT TOTAL KNEE ARTHROPLASTY;  Surgeon: Gaynelle Arabian, MD;  Location: [facility];  Service: Orthopedics;  Laterality: Right;    There were no vitals filed for this visit.  Subjective Assessment - 05/29/19 1438    Subjective  Patient reports still having some trouble with closing eyes, tandem walking, and single limb stance. Reports her balance is  improving, has had no falls or LOB since last session.    Pertinent History  Numbness and tingling in bilateral lower extremities concerning for peripheral neuropathy: Patient states she has been having burning, tingling, and numbness in her bilateral feet and toes (4th and 5th digit on the right primarily) to the distal aspect of her right leg since around 2017 or so. Symptoms came on gradually and have gotten worse. Symptoms did not start after any international travel, unusual food intake, or infections. She is not diabeticPatient reports that her balance deficits began 6 months ago. she has had 1 fall. She lives alone . She does not walk with a cane. she walks 1 mile 2 x / day with neighbors. she has been doing that for a year.    How long can you sit comfortably?  unlimited    How long can you stand comfortably?  unlimited    How long can you walk comfortably?  1 mile    Patient Stated Goals  be more secure in her balance.    Currently in Pain?  No/denies      Nustep Lvl 4 RPM> 60 for cardiovascular challenge, musculoskeletal challenge 4 minutes.    Neuromuscular Re-education  Airex pad: head turns left and right 60 seconds x 2 trials airex  pad: one foot on 7" step; 2x 45 second holds in modified tandem stance.  airex pad: 7" step toe taps no UE support 10x each LE Hallway ambulation with rainbow ball toss 3ft x 2 trials Hallway ambulation with rainbow ball bounce 95 ft x 2 trials  Bosu ball lunges 10x each LE ; flat side down,  Lateral bosu ball lunges 10x each LE, flat side down Heel raise bilateral concentric, unilateral eccentric 15x Static single limb stance hold 30 seconds No UE support slow marches with focus on single limb stability 10x 3 second holds  Patient performed with instruction, verbal cues, tactile cues of therapist: goal: increase tissue extensibility, promote proper posture, improve mobility    Patient presents to physical therapy with excellent motivation.  Patient continues to be challenged with single limb stability as well as dual task stability. Education on progressions and programing for home for stabilization of single limb positioned performed with patient verbalizing and demonstrating understanding. Patient would benefit from additional skilled PT intervention to improve balance/gait safety and reduce fall risk.                   PT Education - 05/29/19 1439    Education Details  exercise technique, body mechanics and stability, HEP compliance    Person(s) Educated  Patient    Methods  Explanation;Demonstration;Tactile cues;Verbal cues    Comprehension  Verbalized understanding;Returned demonstration;Verbal cues required;Tactile cues required       PT Short Term Goals - 05/15/19 1630      PT SHORT TERM GOAL #1   Title  Patient will be independent in home exercise program to improve strength/mobility for better functional independence with ADLs.    Time  4    Period  Weeks    Status  New    Target Date  06/12/19      PT SHORT TERM GOAL #2   Title  Patient will increase Berg Balance score by > 6 points to demonstrate decreased fall risk during functional activities.    Time  4    Period  Weeks    Status  New    Target Date  06/12/19        PT Long Term Goals - 05/15/19 1631      PT LONG TERM GOAL #1   Title  Patient will ascend/descend 4 stairs without rail assist independently without loss of balance to improve ability to get in/out of home.    Time  8    Period  Weeks    Status  New    Target Date  07/10/19      PT LONG TERM GOAL #2   Title  Patient will increase dynamic gait index score to >19/24 as to demonstrate reduced fall risk and improved dynamic gait balance for better safety with community/home ambulation.    Time  8    Period  Weeks    Status  New    Target Date  07/10/19            Plan - 05/29/19 1624    Clinical Impression Statement  Patient presents to physical therapy with  excellent motivation. Patient continues to be challenged with single limb stability as well as dual task stability. Education on progressions and programing for home for stabilization of single limb positioned performed with patient verbalizing and demonstrating understanding. Patient would benefit from additional skilled PT intervention to improve balance/gait safety and reduce fall risk.    Personal Factors and Comorbidities  Age;Comorbidity 1;Comorbidity 2;Comorbidity 3+    Comorbidities  seizure,    Stability/Clinical Decision Making  Stable/Uncomplicated    Rehab Potential  Good    PT Frequency  2x / week    PT Duration  8 weeks    PT Treatment/Interventions  Balance training;Functional mobility training;Therapeutic activities;Therapeutic exercise;Neuromuscular re-education;Patient/family education    PT Next Visit Plan  balance exercise    PT Home Exercise Plan  tandem stance on pillow; tandem gait at kitchen counter; SLS on pillow    Consulted and Agree with Plan of Care  Patient       Patient will benefit from skilled therapeutic intervention in order to improve the following deficits and impairments:  Abnormal gait, Decreased endurance, Decreased strength, Difficulty walking, Decreased balance  Visit Diagnosis: Other abnormalities of gait and mobility  Difficulty in walking, not elsewhere classified  Muscle weakness (generalized)     Problem List Patient Active Problem List   Diagnosis Date Noted  . Cataract cortical, senile 08/03/2017  . Chronic colitis 08/03/2017  . Medicare annual wellness visit, initial 12/15/2016  . Benign essential hypertension 11/04/2016  . OA (osteoarthritis) of knee 02/12/2016  . Hyperlipidemia, mixed 10/24/2015  . Clostridium difficile colitis 01/02/2014  . Right bundle branch block 10/18/2013    Janna Arch, PT, DPT   05/29/2019, 4:24 PM  [place] MAIN [facility] SERVICES [address] [place],  [place], [address] Phone: [phone]   Fax:  [phone]  Name: EZEKIEL COUEY MRN: [id number] Date of Birth: [birth year]

## 2019-05-31 ENCOUNTER — Ambulatory Visit: Payer: Medicare PPO

## 2019-05-31 ENCOUNTER — Other Ambulatory Visit: Payer: Self-pay

## 2019-05-31 DIAGNOSIS — R2689 Other abnormalities of gait and mobility: Secondary | ICD-10-CM | POA: Diagnosis not present

## 2019-05-31 DIAGNOSIS — R262 Difficulty in walking, not elsewhere classified: Secondary | ICD-10-CM

## 2019-05-31 DIAGNOSIS — M6281 Muscle weakness (generalized): Secondary | ICD-10-CM

## 2019-05-31 NOTE — Therapy (Signed)
[place] MAIN [facility] SERVICES [address] [place], [place], [address] Phone: [phone]   Fax:  [phone]  Physical Therapy Treatment  Patient Details  Name: Kiara [name] MRN: [id number] Date of Birth: 03-Jan-1935 Referring Provider (PT): Jennings Books   Encounter Date: 05/31/2019  PT End of Session - 05/31/19 1438    Visit Number  6    Number of Visits  17    Date for PT Re-Evaluation  07/10/19    PT Start Time  [id number]    PT Stop Time  1511    PT Time Calculation (min)  38 min    Activity Tolerance  Patient tolerated treatment well;No increased pain;Patient limited by fatigue    Behavior During Therapy  [facility] for tasks assessed/performed       Past Medical History:  Diagnosis Date  . Arthritis    osteoarthritis -right knee.  . C. difficile colitis    past,no recent issues  . Colitis   . Diverticulitis   . Fall    "tripped"-10 days ago-scrapped right knee and lateral right brow area-" healing"  . GERD (gastroesophageal reflux disease)    occ.  . Seasonal allergies     Past Surgical History:  Procedure Laterality Date  . APPENDECTOMY    . CATARACT EXTRACTION, BILATERAL    . EYE SURGERY     ? right eye retina repair.  Marland Kitchen HEMORRHOID SURGERY    . KNEE ARTHROSCOPY Right 11/26/2014   Procedure: ARTHROSCOPY KNEE, Tear medical horn and anterior horn. Lateral mesicus tear, grade 3 medial;  Surgeon: Dereck Leep, MD;  Location: [facility] ORS;  Service: Orthopedics;  Laterality: Right;  . MOLE REMOVAL     benign  . TONSILLECTOMY    . TOTAL KNEE ARTHROPLASTY Right 02/12/2016   Procedure: RIGHT TOTAL KNEE ARTHROPLASTY;  Surgeon: Gaynelle Arabian, MD;  Location: [facility];  Service: Orthopedics;  Laterality: Right;    There were no vitals filed for this visit.  Subjective Assessment - 05/31/19 1437    Subjective  Pt doing well, no pain, no recent falls. Limited recent HPE compliance.    Pertinent History  Numbness and tingling in bilateral  lower extremities concerning for peripheral neuropathy: Patient states she has been having burning, tingling, and numbness in her bilateral feet and toes (4th and 5th digit on the right primarily) to the distal aspect of her right leg since around 2017 or so. Symptoms came on gradually and have gotten worse. Symptoms did not start after any international travel, unusual food intake, or infections. She is not diabeticPatient reports that her balance deficits began 6 months ago. she has had 1 fall. She lives alone . She does not walk with a cane. she walks 1 mile 2 x / day with neighbors. she has been doing that for a year.    Currently in Pain?  No/denies      INTERVENTION THIS DATE   Neuromuscular Re-education  -Resisted cable walking 2x 4-directions of resistance: 4-square pattern on red mat, minGuard assist to minA, 7.5lb pelvic resistance  -FWD walking, eyes closed, minGuard assist, VC for navigation 2x82ft -Tandem stance cable row 1x8 each stance 12.5lbs -Narrow Stance pavloff press 1x8 bilat 7.5lbs      Therapeutic Exercise  -Heel raise bilateral 2x25 -Static single limb stance hold 30 seconds 3x bilat (LOB Q 3-4 sec) Limit to 20sec next time d/t fatigue limitations -No UE support slow marches with focus on single limb stability 10x3 second holds  PT Short Term Goals - 05/15/19 1630      PT SHORT TERM GOAL #1   Title  Patient will be independent in home exercise program to improve strength/mobility for better functional independence with ADLs.    Time  4    Period  Weeks    Status  New    Target Date  06/12/19      PT SHORT TERM GOAL #2   Title  Patient will increase Berg Balance score by > 6 points to demonstrate decreased fall risk during functional activities.    Time  4    Period  Weeks    Status  New    Target Date  06/12/19        PT Long Term Goals - 05/15/19 1631      PT LONG TERM GOAL #1   Title  Patient will ascend/descend 4 stairs without rail assist  independently without loss of balance to improve ability to get in/out of home.    Time  8    Period  Weeks    Status  New    Target Date  07/10/19      PT LONG TERM GOAL #2   Title  Patient will increase dynamic gait index score to >19/24 as to demonstrate reduced fall risk and improved dynamic gait balance for better safety with community/home ambulation.    Time  8    Period  Weeks    Status  New    Target Date  07/10/19            Plan - 05/31/19 1439    Clinical Impression Statement  Continued with current plan of care, gently progressing patient's program aimed at address deficits and limitations identified in evlauation. Pt continues to make steady progress toward treatment goals in general. Author provides extensive verbal, visual, and tactile cues when needed to assure all interventions are performed with desired form and good accuracy. Extensive communicaiton to assure pt is able to perform all activities without exacerbation of pain or other symptoms. Progressed dynamic balance activity to include soft and uneven surfaces, as well as banked surfaces. Pt does well with this challenge.     Stability/Clinical Decision Making  Stable/Uncomplicated    Rehab Potential  Good    PT Frequency  2x / week    PT Duration  8 weeks    PT Treatment/Interventions  Balance training;Functional mobility training;Therapeutic activities;Therapeutic exercise;Neuromuscular re-education;Patient/family education    PT Next Visit Plan  Continue to progress balance    PT Home Exercise Plan  tandem stance on pillow; tandem gait at kitchen counter; SLS on pillow    Consulted and Agree with Plan of Care  Patient       Patient will benefit from skilled therapeutic intervention in order to improve the following deficits and impairments:  Abnormal gait, Decreased endurance, Decreased strength, Difficulty walking, Decreased balance  Visit Diagnosis: Other abnormalities of gait and mobility  Difficulty  in walking, not elsewhere classified  Muscle weakness (generalized)     Problem List Patient Active Problem List   Diagnosis Date Noted  . Cataract cortical, senile 08/03/2017  . Chronic colitis 08/03/2017  . Medicare annual wellness visit, initial 12/15/2016  . Benign essential hypertension 11/04/2016  . OA (osteoarthritis) of knee 02/12/2016  . Hyperlipidemia, mixed 10/24/2015  . Clostridium difficile colitis 01/02/2014  . Right bundle branch block 10/18/2013   2:59 PM, 05/31/19 Etta Grandchild, PT, DPT Physical Therapist - [facility]  [place] [phone]     Etta Grandchild 05/31/2019, 2:41 PM  [place] MAIN [facility] SERVICES [address] [place], [place], [address] Phone: [phone]   Fax:  [phone]  Name: Kiara [name] MRN: [id number] Date of Birth: 1935/03/18

## 2019-06-05 ENCOUNTER — Encounter: Payer: Medicare PPO | Admitting: Physical Therapy

## 2019-06-05 ENCOUNTER — Other Ambulatory Visit: Payer: Self-pay | Admitting: Internal Medicine

## 2019-06-05 DIAGNOSIS — Z1231 Encounter for screening mammogram for malignant neoplasm of breast: Secondary | ICD-10-CM

## 2019-06-07 ENCOUNTER — Encounter: Payer: Medicare PPO | Admitting: Physical Therapy

## 2019-06-12 ENCOUNTER — Ambulatory Visit: Payer: Medicare PPO | Admitting: Physical Therapy

## 2019-06-12 ENCOUNTER — Other Ambulatory Visit: Payer: Self-pay

## 2019-06-12 ENCOUNTER — Encounter: Payer: Self-pay | Admitting: Physical Therapy

## 2019-06-12 DIAGNOSIS — R2689 Other abnormalities of gait and mobility: Secondary | ICD-10-CM | POA: Diagnosis not present

## 2019-06-12 DIAGNOSIS — M6281 Muscle weakness (generalized): Secondary | ICD-10-CM

## 2019-06-12 DIAGNOSIS — R262 Difficulty in walking, not elsewhere classified: Secondary | ICD-10-CM

## 2019-06-12 NOTE — Therapy (Signed)
[place] MAIN [facility] SERVICES [address] [place], [place], [address] Phone: [phone]   Fax:  [phone]  Physical Therapy Treatment  Patient Details  Name: Kiara [name] MRN: [id number] Date of Birth: 01/06/1935 Referring Provider (PT): shah, [place]   Encounter Date: 06/12/2019  PT End of Session - 06/12/19 1535    Visit Number  7    Number of Visits  17    Date for PT Re-Evaluation  07/10/19    PT Start Time  1520    PT Stop Time  1600    PT Time Calculation (min)  40 min    Equipment Utilized During Treatment  Gait belt    Activity Tolerance  Patient tolerated treatment well;No increased pain;Patient limited by fatigue    Behavior During Therapy  [facility] for tasks assessed/performed       Past Medical History:  Diagnosis Date  . Arthritis    osteoarthritis -right knee.  . C. difficile colitis    past,no recent issues  . Colitis   . Diverticulitis   . Fall    "tripped"-10 days ago-scrapped right knee and lateral right brow area-" healing"  . GERD (gastroesophageal reflux disease)    occ.  . Seasonal allergies     Past Surgical History:  Procedure Laterality Date  . APPENDECTOMY    . CATARACT EXTRACTION, BILATERAL    . EYE SURGERY     ? right eye retina repair.  Marland Kitchen HEMORRHOID SURGERY    . KNEE ARTHROSCOPY Right 11/26/2014   Procedure: ARTHROSCOPY KNEE, Tear medical horn and anterior horn. Lateral mesicus tear, grade 3 medial;  Surgeon: Dereck Leep, MD;  Location: [facility] ORS;  Service: Orthopedics;  Laterality: Right;  . MOLE REMOVAL     benign  . TONSILLECTOMY    . TOTAL KNEE ARTHROPLASTY Right 02/12/2016   Procedure: RIGHT TOTAL KNEE ARTHROPLASTY;  Surgeon: Gaynelle Arabian, MD;  Location: [facility];  Service: Orthopedics;  Laterality: Right;    There were no vitals filed for this visit.  Subjective Assessment - 06/12/19 1534    Subjective  Pt doing well, no pain, no recent falls. Limited recent HPE compliance.    Pertinent History  Numbness and tingling in bilateral lower extremities concerning for peripheral neuropathy: Patient states she has been having burning, tingling, and numbness in her bilateral feet and toes (4th and 5th digit on the right primarily) to the distal aspect of her right leg since around 2017 or so. Symptoms came on gradually and have gotten worse. Symptoms did not start after any international travel, unusual food intake, or infections. She is not diabeticPatient reports that her balance deficits began 6 months ago. she has had 1 fall. She lives alone . She does not walk with a cane. she walks 1 mile 2 x / day with neighbors. she has been doing that for a year.    How long can you sit comfortably?  unlimited    How long can you stand comfortably?  unlimited    How long can you walk comfortably?  1 mile    Patient Stated Goals  be more secure in her balance.    Currently in Pain?  No/denies    Multiple Pain Sites  No     Treatment: Octane fitness x 5 mins l 4   Neuromuscular Re-education  Hurdle  fwd/bwd, side to side x 20 each direction Tandem stand without UE support x 2 lengths Modified tandem and head turns x  1 mins   tandem on foam and 6 inch stool and trunk rotation x 20 Side stepping on floor wiithout UE support x 2 lengths Heel/toe raises without UE support 3s hold x 10 each Matrix side stepping, fwd/bwd x 22. 5 lbs x 3 reps Backwards stepping from foam to 6 inch stool x 15        Pt educated throughout session about proper posture and technique with exercises. Improved exercise technique, movement at target joints, use of target muscles after min to mod verbal, visual, tactile cues. CGA and Min to mod verbal cues used throughout with increased in postural sway and LOB most seen with narrow base of support and while on uneven surfaces.                       PT Education - 06/12/19 1535    Education Details  HEP    Person(s) Educated  Patient     Methods  Explanation    Comprehension  Verbalized understanding       PT Short Term Goals - 05/15/19 1630      PT SHORT TERM GOAL #1   Title  Patient will be independent in home exercise program to improve strength/mobility for better functional independence with ADLs.    Time  4    Period  Weeks    Status  New    Target Date  06/12/19      PT SHORT TERM GOAL #2   Title  Patient will increase Berg Balance score by > 6 points to demonstrate decreased fall risk during functional activities.    Time  4    Period  Weeks    Status  New    Target Date  06/12/19        PT Long Term Goals - 05/15/19 1631      PT LONG TERM GOAL #1   Title  Patient will ascend/descend 4 stairs without rail assist independently without loss of balance to improve ability to get in/out of home.    Time  8    Period  Weeks    Status  New    Target Date  07/10/19      PT LONG TERM GOAL #2   Title  Patient will increase dynamic gait index score to >19/24 as to demonstrate reduced fall risk and improved dynamic gait balance for better safety with community/home ambulation.    Time  8    Period  Weeks    Status  New    Target Date  07/10/19            Plan - 06/12/19 1601    Clinical Impression Statement  Patient instructed in intermediate LE strengthening and intermediate balance exercise. Patient required min VCS to improve weight shift and to increase terminal knee extension for better stance control. Patient would benefit from additional skilled PT intervention to improve strength, balance and gait safety.   Personal Factors and Comorbidities  Age;Comorbidity 1;Comorbidity 2;Comorbidity 3+    Comorbidities  seizure,    Stability/Clinical Decision Making  Stable/Uncomplicated    Rehab Potential  Good    PT Frequency  2x / week    PT Duration  8 weeks    PT Treatment/Interventions  Balance training;Functional mobility training;Therapeutic activities;Therapeutic exercise;Neuromuscular  re-education;Patient/family education    PT Next Visit Plan  Continue to progress balance    PT Home Exercise Plan  tandem stance on pillow; tandem gait at kitchen counter;  SLS on pillow    Consulted and Agree with Plan of Care  Patient       Patient will benefit from skilled therapeutic intervention in order to improve the following deficits and impairments:  Abnormal gait, Decreased endurance, Decreased strength, Difficulty walking, Decreased balance  Visit Diagnosis: Other abnormalities of gait and mobility  Difficulty in walking, not elsewhere classified  Muscle weakness (generalized)     Problem List Patient Active Problem List   Diagnosis Date Noted  . Cataract cortical, senile 08/03/2017  . Chronic colitis 08/03/2017  . Medicare annual wellness visit, initial 12/15/2016  . Benign essential hypertension 11/04/2016  . OA (osteoarthritis) of knee 02/12/2016  . Hyperlipidemia, mixed 10/24/2015  . Clostridium difficile colitis 01/02/2014  . Right bundle branch block 10/18/2013    Alanson Puls, [place] DPT 06/12/2019, 4:29 PM  [place] MAIN [facility] SERVICES [address] [place], [place], [address] Phone: [phone]   Fax:  [phone]  Name: Kiara [name] MRN: [id number] Date of Birth: 09/14/1934

## 2019-06-14 ENCOUNTER — Ambulatory Visit: Payer: Medicare PPO | Admitting: Physical Therapy

## 2019-06-14 ENCOUNTER — Encounter: Payer: Self-pay | Admitting: Physical Therapy

## 2019-06-14 ENCOUNTER — Other Ambulatory Visit: Payer: Self-pay

## 2019-06-14 DIAGNOSIS — R262 Difficulty in walking, not elsewhere classified: Secondary | ICD-10-CM

## 2019-06-14 DIAGNOSIS — M6281 Muscle weakness (generalized): Secondary | ICD-10-CM

## 2019-06-14 DIAGNOSIS — R2689 Other abnormalities of gait and mobility: Secondary | ICD-10-CM | POA: Diagnosis not present

## 2019-06-14 NOTE — Therapy (Signed)
[place] MAIN [facility] SERVICES [address] [place], [place], [address] Phone: [phone]   Fax:  [phone]  Physical Therapy Treatment  Patient Details  Name: Kiara [name] MRN: [id number] Date of Birth: 01-May-1934 Referring Provider (PT): Jennings Books   Encounter Date: 06/14/2019  PT End of Session - 06/14/19 1528    Visit Number  8    Number of Visits  17    Date for PT Re-Evaluation  07/10/19    PT Start Time  1520    PT Stop Time  1600    PT Time Calculation (min)  40 min    Equipment Utilized During Treatment  Gait belt    Activity Tolerance  Patient tolerated treatment well;No increased pain;Patient limited by fatigue    Behavior During Therapy  [facility] for tasks assessed/performed       Past Medical History:  Diagnosis Date  . Arthritis    osteoarthritis -right knee.  . C. difficile colitis    past,no recent issues  . Colitis   . Diverticulitis   . Fall    "tripped"-10 days ago-scrapped right knee and lateral right brow area-" healing"  . GERD (gastroesophageal reflux disease)    occ.  . Seasonal allergies     Past Surgical History:  Procedure Laterality Date  . APPENDECTOMY    . CATARACT EXTRACTION, BILATERAL    . EYE SURGERY     ? right eye retina repair.  Marland Kitchen HEMORRHOID SURGERY    . KNEE ARTHROSCOPY Right 11/26/2014   Procedure: ARTHROSCOPY KNEE, Tear medical horn and anterior horn. Lateral mesicus tear, grade 3 medial;  Surgeon: Dereck Leep, MD;  Location: [facility] ORS;  Service: Orthopedics;  Laterality: Right;  . MOLE REMOVAL     benign  . TONSILLECTOMY    . TOTAL KNEE ARTHROPLASTY Right 02/12/2016   Procedure: RIGHT TOTAL KNEE ARTHROPLASTY;  Surgeon: Gaynelle Arabian, MD;  Location: [facility];  Service: Orthopedics;  Laterality: Right;    There were no vitals filed for this visit.  Subjective Assessment - 06/14/19 1527    Subjective  Pt doing well, no pain, no recent falls. Limited recent HEP compliance.     Pertinent History  Numbness and tingling in bilateral lower extremities concerning for peripheral neuropathy: Patient states she has been having burning, tingling, and numbness in her bilateral feet and toes (4th and 5th digit on the right primarily) to the distal aspect of her right leg since around 2017 or so. Symptoms came on gradually and have gotten worse. Symptoms did not start after any international travel, unusual food intake, or infections. She is not diabeticPatient reports that her balance deficits began 6 months ago. she has had 1 fall. She lives alone . She does not walk with a cane. she walks 1 mile 2 x / day with neighbors. she has been doing that for a year.    How long can you sit comfortably?  unlimited    How long can you stand comfortably?  unlimited    How long can you walk comfortably?  1 mile    Patient Stated Goals  be more secure in her balance.    Currently in Pain?  No/denies    Multiple Pain Sites  No       Neuromuscular Re-education  Step over hurdle fwd/bwd, side to side x 20 each direction Tandem stand without UE support x 1 min Stand on foam with baloon toss without UE support x 2 mins  Heel/toe raises without UE support 3s hold x 10 each 1/2 foam roll balance with flat side up 30s x 2 reps 1/2 foam roll balance with flat side down 30s x 2 reps 1/2 foam roll tandem balance alternating forward LE 30s x 2 each LE forward Lateral side steps from foam to 6 inch stool left and right x 15 Backwards stepping from foam to 6 inch stool x 15  Leg press 25 lbs heel raises x 20 x 3  Leg press 40 lbs x 20 x 3       Pt educated throughout session about proper posture and technique with exercises. Improved exercise technique, movement at target joints, use of target muscles after min to mod verbal, visual, tactile cues. CGA and Min to mod verbal cues used throughout with increased in postural sway and LOB most seen with narrow base of support and while on uneven  surfaces                       PT Education - 06/14/19 1527    Education Details  HEP    Person(s) Educated  Patient    Methods  Explanation;Tactile cues    Comprehension  Returned demonstration;Need further instruction       PT Short Term Goals - 05/15/19 1630      PT SHORT TERM GOAL #1   Title  Patient will be independent in home exercise program to improve strength/mobility for better functional independence with ADLs.    Time  4    Period  Weeks    Status  New    Target Date  06/12/19      PT SHORT TERM GOAL #2   Title  Patient will increase Berg Balance score by > 6 points to demonstrate decreased fall risk during functional activities.    Time  4    Period  Weeks    Status  New    Target Date  06/12/19        PT Long Term Goals - 05/15/19 1631      PT LONG TERM GOAL #1   Title  Patient will ascend/descend 4 stairs without rail assist independently without loss of balance to improve ability to get in/out of home.    Time  8    Period  Weeks    Status  New    Target Date  07/10/19      PT LONG TERM GOAL #2   Title  Patient will increase dynamic gait index score to >19/24 as to demonstrate reduced fall risk and improved dynamic gait balance for better safety with community/home ambulation.    Time  8    Period  Weeks    Status  New    Target Date  07/10/19            Plan - 06/14/19 1528    Clinical Impression Statement  Pt demonstrates increased postural sway when standing on uneven surface and requires // bars to steady, and demonstrates fatigue at end of set of exercises focused on strength and endurance.  Patient will continue to benefit from skilled PT for improved balance and strength    Personal Factors and Comorbidities  Age;Comorbidity 1;Comorbidity 2;Comorbidity 3+    Comorbidities  seizure,    Stability/Clinical Decision Making  Stable/Uncomplicated    Rehab Potential  Good    PT Frequency  2x / week    PT Duration  8 weeks     PT  Treatment/Interventions  Balance training;Functional mobility training;Therapeutic activities;Therapeutic exercise;Neuromuscular re-education;Patient/family education    PT Next Visit Plan  Continue to progress balance    PT Home Exercise Plan  tandem stance on pillow; tandem gait at kitchen counter; SLS on pillow    Consulted and Agree with Plan of Care  Patient       Patient will benefit from skilled therapeutic intervention in order to improve the following deficits and impairments:  Abnormal gait, Decreased endurance, Decreased strength, Difficulty walking, Decreased balance  Visit Diagnosis: Other abnormalities of gait and mobility  Difficulty in walking, not elsewhere classified  Muscle weakness (generalized)     Problem List Patient Active Problem List   Diagnosis Date Noted  . Cataract cortical, senile 08/03/2017  . Chronic colitis 08/03/2017  . Medicare annual wellness visit, initial 12/15/2016  . Benign essential hypertension 11/04/2016  . OA (osteoarthritis) of knee 02/12/2016  . Hyperlipidemia, mixed 10/24/2015  . Clostridium difficile colitis 01/02/2014  . Right bundle branch block 10/18/2013    Alanson Puls, [place] DPT 06/14/2019, 3:31 PM  [place] MAIN [facility] SERVICES [address] [place], [place], [address] Phone: [phone]   Fax:  [phone]  Name: Kiara [name] MRN: [id number] Date of Birth: [birth year]

## 2019-06-19 ENCOUNTER — Other Ambulatory Visit: Payer: Self-pay

## 2019-06-19 ENCOUNTER — Ambulatory Visit: Payer: Medicare PPO | Admitting: Physical Therapy

## 2019-06-19 ENCOUNTER — Encounter: Payer: Self-pay | Admitting: Physical Therapy

## 2019-06-19 DIAGNOSIS — R2689 Other abnormalities of gait and mobility: Secondary | ICD-10-CM

## 2019-06-19 DIAGNOSIS — R262 Difficulty in walking, not elsewhere classified: Secondary | ICD-10-CM

## 2019-06-19 DIAGNOSIS — M6281 Muscle weakness (generalized): Secondary | ICD-10-CM

## 2019-06-19 NOTE — Therapy (Signed)
[place] MAIN [facility] SERVICES [address] [place], [place], [address] Phone: [phone]   Fax:  [phone]  Physical Therapy Treatment  Patient Details  Name: Kiara [name] MRN: [id number] Date of Birth: [birth year] Referring Provider (PT): Jennings Books   Encounter Date: 06/19/2019  PT End of Session - 06/19/19 1536    Visit Number  9    Number of Visits  17    Date for PT Re-Evaluation  07/10/19    PT Start Time  [id number]    PT Stop Time  1601    PT Time Calculation (min)  38 min    Equipment Utilized During Treatment  Gait belt    Activity Tolerance  Patient tolerated treatment well;No increased pain;Patient limited by fatigue    Behavior During Therapy  [facility] for tasks assessed/performed       Past Medical History:  Diagnosis Date  . Arthritis    osteoarthritis -right knee.  . C. difficile colitis    past,no recent issues  . Colitis   . Diverticulitis   . Fall    "tripped"-10 days ago-scrapped right knee and lateral right brow area-" healing"  . GERD (gastroesophageal reflux disease)    occ.  . Seasonal allergies     Past Surgical History:  Procedure Laterality Date  . APPENDECTOMY    . CATARACT EXTRACTION, BILATERAL    . EYE SURGERY     ? right eye retina repair.  Marland Kitchen HEMORRHOID SURGERY    . KNEE ARTHROSCOPY Right 11/26/2014   Procedure: ARTHROSCOPY KNEE, Tear medical horn and anterior horn. Lateral mesicus tear, grade 3 medial;  Surgeon: Dereck Leep, MD;  Location: [facility] ORS;  Service: Orthopedics;  Laterality: Right;  . MOLE REMOVAL     benign  . TONSILLECTOMY    . TOTAL KNEE ARTHROPLASTY Right 02/12/2016   Procedure: RIGHT TOTAL KNEE ARTHROPLASTY;  Surgeon: Gaynelle Arabian, MD;  Location: [facility];  Service: Orthopedics;  Laterality: Right;    There were no vitals filed for this visit.  Subjective Assessment - 06/19/19 1524    Subjective  Pt doing well, no pain, no recent falls. Limited recent HEP compliance.    Pertinent History  Numbness and tingling in bilateral lower extremities concerning for peripheral neuropathy: Patient states she has been having burning, tingling, and numbness in her bilateral feet and toes (4th and 5th digit on the right primarily) to the distal aspect of her right leg since around 2017 or so. Symptoms came on gradually and have gotten worse. Symptoms did not start after any international travel, unusual food intake, or infections. She is not diabeticPatient reports that her balance deficits began 6 months ago. she has had 1 fall. She lives alone . She does not walk with a cane. she walks 1 mile 2 x / day with neighbors. she has been doing that for a year.    How long can you sit comfortably?  unlimited    How long can you stand comfortably?  unlimited    How long can you walk comfortably?  1 mile    Patient Stated Goals  be more secure in her balance.       Neuromuscular Re-education  Rocker board fwd/bwd, side to side x 20 each direction Tandem gait on 2"x4" without UE support x 2 lengths Side stepping on 2"x4" without UE support x 2 lengths Heel/toe raises without UE support 3s hold x 10 each 1/2 foam roll balance with flat side up  30s x 2 reps 1/2 foam roll balance with flat side down 30s x 2 reps 1/2 foam roll tandem balance alternating forward LE 30s x 2 each LE forward Lateral side steps from foam to 6 inch stool left and right x 15 Backwards stepping from foam to 6 inch stool x 15        Pt educated throughout session about proper posture and technique with exercises. Improved exercise technique, movement at target joints, use of target muscles after min to mod verbal, visual, tactile cues. CGA and Min to mod verbal cues used throughout with increased in postural sway and LOB most seen with narrow base of support and while on uneven surfaces. Continues to have balance deficits typical with diagnosis.                       PT Education - 06/19/19  1529    Education Details  HEP    Person(s) Educated  Patient    Methods  Explanation    Comprehension  Verbalized understanding;Tactile cues required;Need further instruction       PT Short Term Goals - 05/15/19 1630      PT SHORT TERM GOAL #1   Title  Patient will be independent in home exercise program to improve strength/mobility for better functional independence with ADLs.    Time  4    Period  Weeks    Status  New    Target Date  06/12/19      PT SHORT TERM GOAL #2   Title  Patient will increase Berg Balance score by > 6 points to demonstrate decreased fall risk during functional activities.    Time  4    Period  Weeks    Status  New    Target Date  06/12/19        PT Long Term Goals - 05/15/19 1631      PT LONG TERM GOAL #1   Title  Patient will ascend/descend 4 stairs without rail assist independently without loss of balance to improve ability to get in/out of home.    Time  8    Period  Weeks    Status  New    Target Date  07/10/19      PT LONG TERM GOAL #2   Title  Patient will increase dynamic gait index score to >19/24 as to demonstrate reduced fall risk and improved dynamic gait balance for better safety with community/home ambulation.    Time  8    Period  Weeks    Status  New    Target Date  07/10/19            Plan - 06/19/19 1538    Clinical Impression Statement  Pt demonstrates increased postural sway when standing on uneven surface and requires // bars to steady, and demonstrates fatigue at end of set of exercises focused on strength and endurance.  Patient will continue to benefit from skilled PT for improved balance and strength   Personal Factors and Comorbidities  Age;Comorbidity 1;Comorbidity 2;Comorbidity 3+    Comorbidities  seizure,    Stability/Clinical Decision Making  Stable/Uncomplicated    Rehab Potential  Good    PT Frequency  2x / week    PT Duration  8 weeks    PT Treatment/Interventions  Balance training;Functional  mobility training;Therapeutic activities;Therapeutic exercise;Neuromuscular re-education;Patient/family education    PT Next Visit Plan  Continue to progress balance    PT Home Exercise Plan  tandem stance on pillow; tandem gait at kitchen counter; SLS on pillow    Consulted and Agree with Plan of Care  Patient       Patient will benefit from skilled therapeutic intervention in order to improve the following deficits and impairments:  Abnormal gait, Decreased endurance, Decreased strength, Difficulty walking, Decreased balance  Visit Diagnosis: Difficulty in walking, not elsewhere classified  Other abnormalities of gait and mobility  Muscle weakness (generalized)     Problem List Patient Active Problem List   Diagnosis Date Noted  . Cataract cortical, senile 08/03/2017  . Chronic colitis 08/03/2017  . Medicare annual wellness visit, initial 12/15/2016  . Benign essential hypertension 11/04/2016  . OA (osteoarthritis) of knee 02/12/2016  . Hyperlipidemia, mixed 10/24/2015  . Clostridium difficile colitis 01/02/2014  . Right bundle branch block 10/18/2013    Alanson Puls PT DPT 06/19/2019, 3:42 PM  [place] MAIN [facility] SERVICES [address] [place], [place], [address] Phone: [phone]   Fax:  [phone]  Name: Kiara [name] MRN: [id number] Date of Birth: [birth year]

## 2019-06-21 ENCOUNTER — Ambulatory Visit: Payer: Medicare PPO | Admitting: Physical Therapy

## 2019-06-21 ENCOUNTER — Other Ambulatory Visit: Payer: Self-pay

## 2019-06-21 ENCOUNTER — Encounter: Payer: Self-pay | Admitting: Physical Therapy

## 2019-06-21 DIAGNOSIS — M6281 Muscle weakness (generalized): Secondary | ICD-10-CM

## 2019-06-21 DIAGNOSIS — R2689 Other abnormalities of gait and mobility: Secondary | ICD-10-CM | POA: Diagnosis not present

## 2019-06-21 DIAGNOSIS — R262 Difficulty in walking, not elsewhere classified: Secondary | ICD-10-CM

## 2019-06-21 NOTE — Therapy (Signed)
[place] MAIN [facility] SERVICES [address] [place], [place], [address] Phone: [phone]   Fax:  [phone]  Physical Therapy Treatment Physical Therapy Progress Note   Dates of reporting period 05/15/19  to  06/21/19  Patient Details  Name: Kiara [name] MRN: [id number] Date of Birth: Sep 30, 1934 Referring Provider (PT): Jennings Books   Encounter Date: 06/21/2019  PT End of Session - 06/21/19 1531    Visit Number  10    Number of Visits  17    Date for PT Re-Evaluation  07/10/19    PT Start Time  1525    PT Stop Time  1603    PT Time Calculation (min)  38 min    Equipment Utilized During Treatment  Gait belt    Activity Tolerance  Patient tolerated treatment well;No increased pain;Patient limited by fatigue    Behavior During Therapy  [facility] for tasks assessed/performed       Past Medical History:  Diagnosis Date  . Arthritis    osteoarthritis -right knee.  . C. difficile colitis    past,no recent issues  . Colitis   . Diverticulitis   . Fall    "tripped"-10 days ago-scrapped right knee and lateral right brow area-" healing"  . GERD (gastroesophageal reflux disease)    occ.  . Seasonal allergies     Past Surgical History:  Procedure Laterality Date  . APPENDECTOMY    . CATARACT EXTRACTION, BILATERAL    . EYE SURGERY     ? right eye retina repair.  Marland Kitchen HEMORRHOID SURGERY    . KNEE ARTHROSCOPY Right 11/26/2014   Procedure: ARTHROSCOPY KNEE, Tear medical horn and anterior horn. Lateral mesicus tear, grade 3 medial;  Surgeon: Dereck Leep, MD;  Location: [facility] ORS;  Service: Orthopedics;  Laterality: Right;  . MOLE REMOVAL     benign  . TONSILLECTOMY    . TOTAL KNEE ARTHROPLASTY Right 02/12/2016   Procedure: RIGHT TOTAL KNEE ARTHROPLASTY;  Surgeon: Gaynelle Arabian, MD;  Location: [facility];  Service: Orthopedics;  Laterality: Right;    There were no vitals filed for this visit.  Subjective Assessment - 06/21/19 1531    Subjective  Pt doing well, no pain, no recent falls. Limited recent HEP compliance.    Pertinent History  Numbness and tingling in bilateral lower extremities concerning for peripheral neuropathy: Patient states she has been having burning, tingling, and numbness in her bilateral feet and toes (4th and 5th digit on the right primarily) to the distal aspect of her right leg since around 2017 or so. Symptoms came on gradually and have gotten worse. Symptoms did not start after any international travel, unusual food intake, or infections. She is not diabeticPatient reports that her balance deficits began 6 months ago. she has had 1 fall. She lives alone . She does not walk with a cane. she walks 1 mile 2 x / day with neighbors. she has been doing that for a year.    How long can you sit comfortably?  unlimited    How long can you stand comfortably?  unlimited    How long can you walk comfortably?  1 mile    Patient Stated Goals  be more secure in her balance.    Currently in Pain?  No/denies    Multiple Pain Sites  No         [facility] PT Assessment - 06/21/19 0001      Standardized Balance Assessment   Standardized Balance  Assessment  Berg Balance Test      Berg Balance Test   Sit to Stand  Able to stand without using hands and stabilize independently    Standing Unsupported  Able to stand safely 2 minutes    Sitting with Back Unsupported but Feet Supported on Floor or Stool  Able to sit safely and securely 2 minutes    Stand to Sit  Sits safely with minimal use of hands    Transfers  Able to transfer safely, minor use of hands    Standing Unsupported with Eyes Closed  Able to stand 10 seconds safely    Standing Unsupported with Feet Together  Able to place feet together independently and stand 1 minute safely    From Standing, Reach Forward with Outstretched Arm  Can reach confidently >25 cm (10")    From Standing Position, Pick up Object from Floor  Able to pick up shoe safely and easily     From Standing Position, Turn to Look Behind Over each Shoulder  Looks behind from both sides and weight shifts well    Turn 360 Degrees  Able to turn 360 degrees safely in 4 seconds or less    Standing Unsupported, Alternately Place Feet on Step/Stool  Able to stand independently and safely and complete 8 steps in 20 seconds    Standing Unsupported, One Foot in Front  Able to place foot tandem independently and hold 30 seconds    Standing on One Leg  Able to lift leg independently and hold > 10 seconds    Total Score  56       Treatment: Berg balance performed 56/56 DGI performed 22/24 Patient was educated on results and HEP.                    PT Education - 06/21/19 1531    Education Details  HEP    Person(s) Educated  Patient    Methods  Explanation    Comprehension  Verbalized understanding;Need further instruction       PT Short Term Goals - 06/21/19 1533      PT SHORT TERM GOAL #1   Title  Patient will be independent in home exercise program to improve strength/mobility for better functional independence with ADLs.    Time  4    Period  Weeks    Status  Achieved    Target Date  06/12/19      PT SHORT TERM GOAL #2   Title  Patient will increase Berg Balance score by > 6 points to demonstrate decreased fall risk during functional activities.    Time  4    Period  Weeks    Status  Achieved    Target Date  06/12/19        PT Long Term Goals - 06/21/19 1648      PT LONG TERM GOAL #1   Title  Patient will ascend/descend 4 stairs without rail assist independently without loss of balance to improve ability to get in/out of home.      PT LONG TERM GOAL #2   Status  Achieved    Target Date  07/03/19            Plan - 06/21/19 1652    Clinical Impression Statement  Patient performs outcome measures to assess goals. Her Berg balance test is 56/56 and her DGI is 22/24 and her balance has improved and she has met her goals for DC today. She will  continue her HEP at home.    Personal Factors and Comorbidities  Age;Comorbidity 1;Comorbidity 2;Comorbidity 3+    Comorbidities  seizure,    Stability/Clinical Decision Making  Stable/Uncomplicated    Rehab Potential  Good    PT Frequency  2x / week    PT Duration  8 weeks    PT Treatment/Interventions  Balance training;Functional mobility training;Therapeutic activities;Therapeutic exercise;Neuromuscular re-education;Patient/family education    PT Next Visit Plan  Continue to progress balance    PT Home Exercise Plan  tandem stance on pillow; tandem gait at kitchen counter; SLS on pillow    Consulted and Agree with Plan of Care  Patient       Patient will benefit from skilled therapeutic intervention in order to improve the following deficits and impairments:  Abnormal gait, Decreased endurance, Decreased strength, Difficulty walking, Decreased balance  Visit Diagnosis: Difficulty in walking, not elsewhere classified  Other abnormalities of gait and mobility  Muscle weakness (generalized)     Problem List Patient Active Problem List   Diagnosis Date Noted  . Cataract cortical, senile 08/03/2017  . Chronic colitis 08/03/2017  . Medicare annual wellness visit, initial 12/15/2016  . Benign essential hypertension 11/04/2016  . OA (osteoarthritis) of knee 02/12/2016  . Hyperlipidemia, mixed 10/24/2015  . Clostridium difficile colitis 01/02/2014  . Right bundle branch block 10/18/2013    Alanson Puls, [place] DPT 06/21/2019, 4:53 PM  [place] MAIN [facility] SERVICES [address] [place], [place], [address] Phone: [phone]   Fax:  [phone]  Name: Kiara [name] MRN: [id number] Date of Birth: [birth year]

## 2019-06-26 ENCOUNTER — Ambulatory Visit
Admission: RE | Admit: 2019-06-26 | Discharge: 2019-06-26 | Disposition: A | Discharge status: Home / Self Care | Payer: Medicare PPO | Source: Ambulatory Visit | Attending: Internal Medicine | Admitting: Internal Medicine

## 2019-06-26 ENCOUNTER — Ambulatory Visit: Payer: Medicare PPO | Admitting: Physical Therapy

## 2019-06-26 DIAGNOSIS — Z1231 Encounter for screening mammogram for malignant neoplasm of breast: Secondary | ICD-10-CM | POA: Insufficient documentation

## 2019-06-29 ENCOUNTER — Ambulatory Visit: Payer: Medicare PPO | Admitting: Physical Therapy

## 2019-06-29 DIAGNOSIS — M48061 Spinal stenosis, lumbar region without neurogenic claudication: Secondary | ICD-10-CM | POA: Insufficient documentation

## 2019-07-03 ENCOUNTER — Ambulatory Visit: Payer: Medicare PPO | Admitting: Physical Therapy

## 2019-07-04 ENCOUNTER — Ambulatory Visit: Payer: Medicare PPO | Admitting: Physical Therapy

## 2019-07-06 ENCOUNTER — Ambulatory Visit: Payer: Medicare PPO | Admitting: Physical Therapy

## 2019-07-10 ENCOUNTER — Ambulatory Visit: Payer: Medicare PPO | Admitting: Physical Therapy

## 2019-07-11 ENCOUNTER — Ambulatory Visit: Payer: Medicare PPO | Admitting: Physical Therapy

## 2019-07-13 ENCOUNTER — Ambulatory Visit: Payer: Medicare PPO | Admitting: Physical Therapy

## 2019-07-17 ENCOUNTER — Ambulatory Visit: Payer: Medicare PPO | Admitting: Physical Therapy

## 2019-07-18 ENCOUNTER — Ambulatory Visit: Payer: Medicare PPO | Admitting: Physical Therapy

## 2019-07-20 ENCOUNTER — Ambulatory Visit: Payer: Medicare PPO | Admitting: Physical Therapy

## 2019-07-24 ENCOUNTER — Ambulatory Visit: Payer: Medicare PPO | Admitting: Physical Therapy

## 2019-07-25 ENCOUNTER — Ambulatory Visit: Payer: Medicare PPO | Admitting: Physical Therapy

## 2019-07-27 ENCOUNTER — Ambulatory Visit: Payer: Medicare PPO | Admitting: Physical Therapy

## 2019-08-12 LAB — COLOGUARD: COLOGUARD: NEGATIVE

## 2019-08-15 ENCOUNTER — Ambulatory Visit
Admission: RE | Admit: 2019-08-15 | Discharge: 2019-08-15 | Disposition: A | Discharge status: Home / Self Care | Payer: Medicare PPO | Source: Ambulatory Visit | Attending: Internal Medicine | Admitting: Internal Medicine

## 2019-08-15 ENCOUNTER — Other Ambulatory Visit: Payer: Self-pay

## 2019-08-15 DIAGNOSIS — R918 Other nonspecific abnormal finding of lung field: Secondary | ICD-10-CM

## 2020-04-06 ENCOUNTER — Other Ambulatory Visit: Payer: Medicare PPO

## 2020-05-14 ENCOUNTER — Other Ambulatory Visit: Payer: Self-pay | Admitting: Internal Medicine

## 2020-05-14 DIAGNOSIS — R918 Other nonspecific abnormal finding of lung field: Secondary | ICD-10-CM

## 2020-05-29 ENCOUNTER — Ambulatory Visit: Payer: Medicare PPO

## 2020-05-31 ENCOUNTER — Other Ambulatory Visit: Payer: Self-pay | Admitting: Internal Medicine

## 2020-05-31 DIAGNOSIS — Z1231 Encounter for screening mammogram for malignant neoplasm of breast: Secondary | ICD-10-CM

## 2020-06-27 ENCOUNTER — Inpatient Hospital Stay: Admission: RE | Admit: 2020-06-27 | Payer: Medicare PPO | Source: Ambulatory Visit

## 2020-07-10 ENCOUNTER — Ambulatory Visit
Admission: RE | Admit: 2020-07-10 | Discharge: 2020-07-10 | Disposition: A | Discharge status: Home / Self Care | Payer: Medicare PPO | Source: Ambulatory Visit | Attending: Internal Medicine | Admitting: Internal Medicine

## 2020-07-10 ENCOUNTER — Other Ambulatory Visit: Payer: Self-pay

## 2020-07-10 DIAGNOSIS — Z1231 Encounter for screening mammogram for malignant neoplasm of breast: Secondary | ICD-10-CM | POA: Diagnosis present

## 2020-08-05 ENCOUNTER — Other Ambulatory Visit: Payer: Self-pay

## 2020-08-05 ENCOUNTER — Emergency Department: Payer: Medicare PPO

## 2020-08-05 ENCOUNTER — Emergency Department
Admission: EM | Admit: 2020-08-05 | Discharge: 2020-08-05 | Disposition: A | Discharge status: Home / Self Care | Payer: Medicare PPO | Attending: Emergency Medicine | Admitting: Emergency Medicine

## 2020-08-05 DIAGNOSIS — S60221A Contusion of right hand, initial encounter: Secondary | ICD-10-CM | POA: Insufficient documentation

## 2020-08-05 DIAGNOSIS — W19XXXA Unspecified fall, initial encounter: Secondary | ICD-10-CM | POA: Diagnosis not present

## 2020-08-05 DIAGNOSIS — R262 Difficulty in walking, not elsewhere classified: Secondary | ICD-10-CM | POA: Diagnosis not present

## 2020-08-05 DIAGNOSIS — S7001XA Contusion of right hip, initial encounter: Secondary | ICD-10-CM | POA: Insufficient documentation

## 2020-08-05 DIAGNOSIS — I10 Essential (primary) hypertension: Secondary | ICD-10-CM | POA: Insufficient documentation

## 2020-08-05 DIAGNOSIS — S79911A Unspecified injury of right hip, initial encounter: Secondary | ICD-10-CM | POA: Diagnosis present

## 2020-08-05 DIAGNOSIS — Z96651 Presence of right artificial knee joint: Secondary | ICD-10-CM | POA: Diagnosis not present

## 2020-08-05 DIAGNOSIS — Z79899 Other long term (current) drug therapy: Secondary | ICD-10-CM | POA: Insufficient documentation

## 2020-08-05 DIAGNOSIS — S6000XA Contusion of unspecified finger without damage to nail, initial encounter: Secondary | ICD-10-CM

## 2020-08-05 DIAGNOSIS — Y93K1 Activity, walking an animal: Secondary | ICD-10-CM | POA: Insufficient documentation

## 2020-08-05 DIAGNOSIS — Z87891 Personal history of nicotine dependence: Secondary | ICD-10-CM | POA: Diagnosis not present

## 2020-08-05 LAB — COMPREHENSIVE METABOLIC PANEL
ALT: 16 U/L (ref 0–44)
AST: 20 U/L (ref 15–41)
Albumin: 4.7 g/dL (ref 3.5–5.0)
Alkaline Phosphatase: 64 U/L (ref 38–126)
Anion gap: 9 (ref 5–15)
BUN: 13 mg/dL (ref 8–23)
CO2: 27 mmol/L (ref 22–32)
Calcium: 9.7 mg/dL (ref 8.9–10.3)
Chloride: 102 mmol/L (ref 98–111)
Creatinine, Ser: 0.74 mg/dL (ref 0.44–1.00)
GFR, Estimated: >60 mL/min (ref >60)
Glucose, Bld: 107 mg/dL — ABNORMAL HIGH (ref 70–99)
Potassium: 3.9 mmol/L (ref 3.5–5.1)
Sodium: 138 mmol/L (ref 135–145)
Total Bilirubin: 0.8 mg/dL (ref 0.3–1.2)
Total Protein: 8.2 g/dL — ABNORMAL HIGH (ref 6.5–8.1)

## 2020-08-05 LAB — DIFFERENTIAL
Abs Immature Granulocytes: 0.01 10*3/uL (ref 0.00–0.07)
Basophils Absolute: 0 10*3/uL (ref 0.0–0.1)
Basophils Relative: 0 %
Eosinophils Absolute: 0 10*3/uL (ref 0.0–0.5)
Eosinophils Relative: 1 %
Immature Granulocytes: 0 %
Lymphocytes Relative: 30 %
Lymphs Abs: 1.2 10*3/uL (ref 0.7–4.0)
Monocytes Absolute: 0.3 10*3/uL (ref 0.1–1.0)
Monocytes Relative: 8 %
Neutro Abs: 2.5 10*3/uL (ref 1.7–7.7)
Neutrophils Relative %: 61 %

## 2020-08-05 LAB — CBC
HCT: 36.1 % (ref 36.0–46.0)
Hemoglobin: 12.1 g/dL (ref 12.0–15.0)
MCH: 31.8 pg (ref 26.0–34.0)
MCHC: 33.5 g/dL (ref 30.0–36.0)
MCV: 95 fL (ref 80.0–100.0)
Platelets: 190 10*3/uL (ref 150–400)
RBC: 3.8 MIL/uL — ABNORMAL LOW (ref 3.87–5.11)
RDW: 12.1 % (ref 11.5–15.5)
WBC: 4 10*3/uL (ref 4.0–10.5)
nRBC: 0 % (ref 0.0–0.2)

## 2020-08-05 LAB — PROTIME-INR
INR: 1 (ref 0.8–1.2)
Prothrombin Time: 13.2 seconds (ref 11.4–15.2)

## 2020-08-05 LAB — APTT: aPTT: 28 seconds (ref 24–36)

## 2020-08-05 LAB — ETHANOL: Alcohol, Ethyl (B): <10 mg/dL (ref <10)

## 2020-08-05 MED ORDER — IOHEXOL 350 MG/ML SOLN
75.0000 mL | Freq: Once | INTRAVENOUS | Status: AC | PRN
  Administered 2020-08-05: 75 mL via INTRAVENOUS

## 2020-08-05 NOTE — ED Notes (Signed)
Patient to CT at this time

## 2020-08-05 NOTE — Discharge Instructions (Signed)
Your MRI is normal.  Please follow up with your doctor this week.

## 2020-08-05 NOTE — ED Triage Notes (Signed)
Pt was sent from dr [facility] office, pt was walking her dog this am and at 0930 and started to feel like she couldn't stop walking and that she was walking in circles. Pt states that she doesn't feel right and is very nervous, pt denies dizziness or weakness at this time, pt is able to stand and walk from the wheelchair to the bed without difficulty but states that she is concentrating very hard

## 2020-08-05 NOTE — ED Notes (Signed)
Patient back to room from MRI

## 2020-08-05 NOTE — ED Notes (Signed)
Patient transported to MRI 

## 2020-08-05 NOTE — ED Notes (Signed)
Patient to xray.

## 2020-08-05 NOTE — ED Provider Notes (Signed)
[facility] Emergency Department Provider Note  ____________________________________________   Event Date/Time   First MD Initiated Contact with Patient 08/05/20 1258     (approximate)  I have reviewed the triage vital signs and the nursing notes.   HISTORY  Chief Complaint Gait Problem   HPI Kiara [name] is a 86 y.o. female with past medical history of arthritis, diverticulitis, GERD, seasonal allergies, arthritis and HTN who presents for assessment chest referred to the ED back in the clinic with concerns of possible stroke after she reported she had some difficulty walking in the fall earlier today.  Patient states she was out walking her dog when she felt that "she could not stop walking in circles".  She states that she fell onto her right hand and right hip.  She did not have any loss of consciousness.  She has never had prior similar episodes.  He states she had a little bit of a headache during this episode he had a similar to prior headache she has had on and off for the last couple months currently does not have a headache.  She states she subsequently has been able to walk normally.  Denies any vertigo, vision changes, chest pain, cough, shortness of breath, nausea, vomiting, diarrhea, dysuria, rash or other recent similar episodes.  States she is little soreness in her right hand and right hip but no other extremity pain.  No other acute concerns at this time.  She is not on any blood thinners.            Past Medical History:  Diagnosis Date  . Arthritis    osteoarthritis -right knee.  . C. difficile colitis    past,no recent issues  . Colitis   . Diverticulitis   . Fall    "tripped"-10 days ago-scrapped right knee and lateral right brow area-" healing"  . GERD (gastroesophageal reflux disease)    occ.  . Seasonal allergies     Patient Active Problem List   Diagnosis Date Noted  . Cataract cortical, senile 08/03/2017  . Chronic  colitis 08/03/2017  . Medicare annual wellness visit, initial 12/15/2016  . Benign essential hypertension 11/04/2016  . OA (osteoarthritis) of knee 02/12/2016  . Hyperlipidemia, mixed 10/24/2015  . Clostridium difficile colitis 01/02/2014  . Right bundle branch block 10/18/2013    Past Surgical History:  Procedure Laterality Date  . APPENDECTOMY    . CATARACT EXTRACTION, BILATERAL    . EYE SURGERY     ? right eye retina repair.  Marland Kitchen HEMORRHOID SURGERY    . KNEE ARTHROSCOPY Right 11/26/2014   Procedure: ARTHROSCOPY KNEE, Tear medical horn and anterior horn. Lateral mesicus tear, grade 3 medial;  Surgeon: Dereck Leep, MD;  Location: [facility] ORS;  Service: Orthopedics;  Laterality: Right;  . MOLE REMOVAL     benign  . TONSILLECTOMY    . TOTAL KNEE ARTHROPLASTY Right 02/12/2016   Procedure: RIGHT TOTAL KNEE ARTHROPLASTY;  Surgeon: Gaynelle Arabian, MD;  Location: [facility] ORS;  Service: Orthopedics;  Laterality: Right;    Prior to Admission medications   Medication Sig Start Date End Date Taking? Authorizing Provider  azelastine (ASTELIN) 0.1 % nasal spray Place 1 spray into both nostrils 2 (two) times daily as needed.   Yes [provider]  dicyclomine (BENTYL) 10 MG capsule Take 10 mg by mouth daily. 01/23/20  Yes [provider]  gabapentin (NEURONTIN) 100 MG capsule Take 100 mg by mouth daily as needed. 01/23/20  Yes [provider]  Multiple Vitamins-Minerals (OCUVITE-LUTEIN PO) Take 1 capsule by mouth daily. 10/28/16  Yes [provider]  Probiotic Product (PROBIOTIC DAILY PO) Take 1 tablet by mouth daily.    Yes [provider]  losartan-hydrochlorothiazide (HYZAAR) 50-12.5 MG tablet Take by mouth. Patient not taking: Reported on 08/05/2020 11/04/16 11/04/17  [provider]  olmesartan (BENICAR) 20 MG tablet  07/29/17   [provider]    Allergies Ciprofloxacin  Family History  Problem Relation Age of Onset  . Breast cancer  Paternal Aunt   . Bone cancer Paternal Uncle     Social History Social History   Tobacco Use  . Smoking status: Former Smoker    Quit date: 02/03/1998    Years since quitting: 22.5  . Smokeless tobacco: Never Used  Substance Use Topics  . Alcohol use: Yes    Alcohol/week: 2.0 standard drinks    Types: 2 Glasses of wine per week    Comment: daily  . Drug use: No    Review of Systems  Review of Systems  Constitutional: Negative for chills and fever.  HENT: Negative for sore throat.   Eyes: Negative for pain.  Respiratory: Negative for cough and stridor.   Cardiovascular: Negative for chest pain.  Gastrointestinal: Negative for vomiting.  Genitourinary: Negative for dysuria.  Musculoskeletal: Negative for myalgias.  Skin: Negative for rash.  Neurological: Positive for dizziness. Negative for seizures, loss of consciousness and headaches.  Psychiatric/Behavioral: Negative for suicidal ideas.  All other systems reviewed and are negative.     ____________________________________________   PHYSICAL EXAM:  VITAL SIGNS: ED Triage Vitals  Enc Vitals Group     BP      Pulse      Resp      Temp      Temp src      SpO2      Weight      Height      Head Circumference      Peak Flow      Pain Score      Pain Loc      Pain Edu?      Excl. in [place]?    Vitals:   08/05/20 1415 08/05/20 1500  BP: (!) 185/80 (!) 148/72  Pulse: 72 62  Resp: 18 18  Temp:    SpO2: 100% 100%   Physical Exam Vitals and nursing note reviewed.  Constitutional:      General: She is not in acute distress.    Appearance: She is well-developed.  HENT:     Head: Normocephalic and atraumatic.     Right Ear: External ear normal.     Left Ear: External ear normal.     Nose: Nose normal.  Eyes:     Conjunctiva/sclera: Conjunctivae normal.  Cardiovascular:     Rate and Rhythm: Normal rate and regular rhythm.     Heart sounds: No murmur heard.   Pulmonary:     Effort: Pulmonary effort is  normal. No respiratory distress.     Breath sounds: Normal breath sounds.  Abdominal:     Palpations: Abdomen is soft.     Tenderness: There is no abdominal tenderness.  Musculoskeletal:     Cervical back: Neck supple.     Right lower leg: No edema.     Left lower leg: No edema.  Skin:    General: Skin is warm and dry.     Capillary Refill: Capillary refill takes less than 2  seconds.  Neurological:     Mental Status: She is alert and oriented to person, place, and time.  Psychiatric:        Mood and Affect: Mood normal.     Cranial nerves II through XII grossly intact.  No pronator drift.  No finger dysmetria.  Symmetric 5/5 strength of all extremities.  Sensation intact to light touch in all extremities.  Unremarkable unassisted gait.  She is a little tenderness over the right hand on the palmar aspect centrally without any snuffbox tenderness.  No puncture wounds.  She is able to flex and extend all digits against resistance.  Sensation intact in the distribution of the radial and median nerves.  2+ radial pulse.  No other obvious trauma to the forearm wrist or elbow.  Very mild tenderness over the right lateral hip.  She otherwise has full range of motion and strength at the right hip compared to the left and is otherwise neurovascular intact throughout the right lower extremity.  No tenderness step-offs deformities over the C/T/L-spine. ____________________________________________   LABS (all labs ordered are listed, but only abnormal results are displayed)  Labs Reviewed  CBC - Abnormal; Notable for the following components:      Result Value   RBC 3.80 (*)    All other components within normal limits  COMPREHENSIVE METABOLIC PANEL - Abnormal; Notable for the following components:   Glucose, Bld 107 (*)    Total Protein 8.2 (*)    All other components within normal limits  ETHANOL  PROTIME-INR  APTT  DIFFERENTIAL  URINE DRUG SCREEN, QUALITATIVE ([facility] ONLY)   URINALYSIS, ROUTINE W REFLEX MICROSCOPIC   ____________________________________________  EKG  Sinus rhythm with a ventricular rate of 64, right bundle branch block, otherwise unremarkable intervals and no clear evidence of acute ischemia or other significant underlying arrhythmia. ____________________________________________  RADIOLOGY  ED MD interpretation: CTA head and neck showed diffuse small vessel disease without evidence of large vessel occlusion SAH or other clear acute intracranial abnormality.  Official radiology report(s): CT ANGIO HEAD NECK W WO CM  Result Date: 08/05/2020 CLINICAL DATA:  Stroke/TIA. EXAM: CT ANGIOGRAPHY HEAD AND NECK TECHNIQUE: Multidetector CT imaging of the head and neck was performed using the standard protocol during bolus administration of intravenous contrast. Multiplanar CT image reconstructions and MIPs were obtained to evaluate the vascular anatomy. Carotid stenosis measurements (when applicable) are obtained utilizing NASCET criteria, using the distal internal carotid diameter as the denominator. CONTRAST:  13mL OMNIPAQUE IOHEXOL 350 MG/ML SOLN COMPARISON:  Head MRI 04/17/2019.  Chest CT 08/15/2019. FINDINGS: CT HEAD FINDINGS Brain: There is no evidence of an acute cortically based infarct, intracranial hemorrhage, mass, midline shift, or extra-axial fluid collection. Confluent hypodensities throughout the cerebral white matter bilaterally correspond to extensive T2 FLAIR hyperintensity on the prior MRI and are nonspecific but compatible with chronic small vessel ischemic disease. Mild cerebral atrophy is within normal limits for age. Vascular: No hyperdense vessel. Skull: No fracture or suspicious osseous lesion. Sinuses: Visualized paranasal sinuses and mastoid air cells are clear. Orbits: Bilateral cataract extraction. Review of the MIP images confirms the above findings CTA NECK FINDINGS Aortic arch: Standard 3 vessel aortic arch with mild atherosclerotic  plaque. No arch vessel origin stenosis. Right carotid system: Patent without evidence of stenosis or dissection. Retropharyngeal course of the proximal ICA. Left carotid system: Patent with a small amount of calcified and soft plaque at the carotid bifurcation. No evidence of stenosis or dissection. Vertebral arteries: Patent without evidence of  stenosis or dissection. Codominant. Skeleton: Advanced disc degeneration from C4-5 to C6-7. Advanced left facet arthrosis at C7-T1 with grade 1 anterolisthesis. Other neck: No evidence of cervical lymphadenopathy or mass. Upper chest: Scattered solid and ground-glass nodules in both upper lobes measure up to 4 mm, unchanged from the prior chest CT. Review of the MIP images confirms the above findings CTA HEAD FINDINGS Anterior circulation: The internal carotid arteries are widely patent from skull base to carotid termini. ACAs and MCAs are patent without evidence of a proximal branch occlusion or significant proximal stenosis. No aneurysm is identified. Posterior circulation: The intracranial vertebral arteries are widely patent to the basilar. The left PICA and right AICA appear dominant. Patent SCAs are seen bilaterally. The basilar artery is widely patent. There is a fetal origin of the left PCA. Both PCAs are patent without evidence of a significant proximal stenosis. No aneurysm is identified. Venous sinuses: Patent. Anatomic variants: Fetal left PCA. Review of the MIP images confirms the above findings IMPRESSION: 1. No evidence of acute intracranial abnormality. 2. Extensive chronic small vessel ischemic disease in the cerebral white matter. 3. Mild atherosclerosis without large vessel occlusion or significant stenosis in the head or neck. 4. Aortic Atherosclerosis (ICD10-I70.0). Electronically Signed   By: Logan Bores M.D.   On: 08/05/2020 14:38   DG Hip Unilat W or Wo Pelvis 2-3 Views Right  Result Date: 08/05/2020 CLINICAL DATA:  [place] while walking her dog.  EXAM: DG HIP (WITH OR WITHOUT PELVIS) 2-3V RIGHT COMPARISON:  None. FINDINGS: There is no evidence of hip fracture or dislocation. There is no evidence of arthropathy or other focal bone abnormality. IMPRESSION: Negative. Electronically Signed   By: Lajean Manes M.D.   On: 08/05/2020 14:07    ____________________________________________   PROCEDURES  Procedure(s) performed (including Critical Care):  .1-3 Lead EKG Interpretation Performed by: Lucrezia Starch, MD Authorized by: Lucrezia Starch, MD     Interpretation: normal     ECG rate assessment: normal     Rhythm: sinus rhythm     Ectopy: none     Conduction: normal       ____________________________________________   INITIAL IMPRESSION / ASSESSMENT AND PLAN / ED COURSE        Patient presents with above to history exam for assessment of difficulty ambulating normally and a fall that occurred earlier today.  She called the clinical clinic referred to the ED with concerns that she may be having a stroke.  On arrival she is afebrile and hemodynamically stable.  She does have a little tenderness over the palmar aspect of her right hand over the lateral half of her right hip she has no objective neurological deficits on exam and was able to ambulate with steady gait in a straight line unassisted.  She states she currently does not have any headache and think she is feeling back to normal although is very nervous about what happened earlier today and is unable to fully describe any other associated symptoms.  With regard to her fall and pain in her right hand and right hip suspect contusions.  Plain film showed no acute fracture dislocation and she is neurovascular intact in both extremities.  Suspicion for occult injury or significant occult visceral injury.  Follow-up seems was precipitated by difficulty ambulating history is much less consistent with presyncopal symptoms.  In addition she denies any lightheadedness shortness of  breath or chest pain.  EKG is unremarkable harms of ischemia or arrhythmia.  No  significant electrolyte metabolic derangements identified patient is not acutely anemic.  What is possible she had a TIA no focal deficits on exam to suggest acute stroke at this time.  In addition she has no nystagmus or other findings such as abnormal gait.  We will plan to obtain CTA head and neck to assess for possible vessel abnormalities.  Pain, lower hips unremarkable.  INR is unremarkable.  PTT is unremarkable.  CBC shows no leukocytosis or acute anemia.  CMP shows no significant electrolyte or metabolic derangements.  Ethanol is undetectable.  CTA with fairly extensive small vessel disease.  All this is fairly reassuring we will obtain follow-up MR and if this is unremarkable I think patient would likely be safe for discharge home with outpatient follow-up.  Plan discussed with oncoming provider Dr. Joni Fears.       ____________________________________________   FINAL CLINICAL IMPRESSION(S) / ED DIAGNOSES  Final diagnoses:  Difficulty walking  Fall, initial encounter  Contusion of right hip, initial encounter  Contusion of finger of right hand, unspecified finger, initial encounter    Medications  iohexol (OMNIPAQUE) 350 MG/ML injection 75 mL (75 mLs Intravenous Contrast Given 08/05/20 1356)     ED Discharge Orders    None       Note:  This document was prepared using Dragon voice recognition software and may include unintentional dictation errors.   Lucrezia Starch, MD 08/05/20 [id number]

## 2020-08-08 DIAGNOSIS — G459 Transient cerebral ischemic attack, unspecified: Secondary | ICD-10-CM | POA: Insufficient documentation

## 2020-08-16 ENCOUNTER — Other Ambulatory Visit: Payer: Self-pay | Admitting: Internal Medicine

## 2020-08-16 DIAGNOSIS — R918 Other nonspecific abnormal finding of lung field: Secondary | ICD-10-CM

## 2020-08-19 ENCOUNTER — Ambulatory Visit: Admission: RE | Admit: 2020-08-19 | Payer: Medicare PPO | Source: Ambulatory Visit

## 2020-08-20 ENCOUNTER — Ambulatory Visit: Payer: Medicare PPO

## 2020-09-02 ENCOUNTER — Ambulatory Visit: Payer: Medicare PPO

## 2020-09-06 ENCOUNTER — Other Ambulatory Visit: Payer: Self-pay

## 2020-09-06 ENCOUNTER — Ambulatory Visit: Payer: Medicare PPO | Attending: Neurology

## 2020-09-06 DIAGNOSIS — M6281 Muscle weakness (generalized): Secondary | ICD-10-CM | POA: Insufficient documentation

## 2020-09-06 DIAGNOSIS — R2689 Other abnormalities of gait and mobility: Secondary | ICD-10-CM | POA: Diagnosis present

## 2020-09-06 DIAGNOSIS — R278 Other lack of coordination: Secondary | ICD-10-CM | POA: Diagnosis present

## 2020-09-06 DIAGNOSIS — R2681 Unsteadiness on feet: Secondary | ICD-10-CM | POA: Diagnosis present

## 2020-09-06 NOTE — Therapy (Signed)
[place] MAIN [facility] SERVICES [address] [place], [place], [address] Phone: [phone]   Fax:  [phone]  Physical Therapy Evaluation  Patient Details  Name: Kiara [name] MRN: [id number] Date of Birth: Sep 28, 1934 Referring Provider (PT): Emily Filbert MD (PCP)   Encounter Date: 09/06/2020   PT End of Session - 09/06/20 1240     Visit Number 1    Number of Visits 16    Date for PT Re-Evaluation 10/04/20    Authorization Type [employer] Choice PPO    Authorization Time Period 09/06/20-11/01/20    PT Start Time 0845    PT Stop Time 0945    PT Time Calculation (min) 60 min    Equipment Utilized During Treatment Gait belt    Activity Tolerance Patient tolerated treatment well;No increased pain    Behavior During Therapy WFL for tasks assessed/performed             Past Medical History:  Diagnosis Date   Arthritis    osteoarthritis -right knee.   C. difficile colitis    past,no recent issues   Colitis    Diverticulitis    Fall    "tripped"-10 days ago-scrapped right knee and lateral right brow area-" healing"   GERD (gastroesophageal reflux disease)    occ.   Seasonal allergies     Past Surgical History:  Procedure Laterality Date   APPENDECTOMY     CATARACT EXTRACTION, BILATERAL     EYE SURGERY     ? right eye retina repair.   HEMORRHOID SURGERY     KNEE ARTHROSCOPY Right 11/26/2014   Procedure: ARTHROSCOPY KNEE, Tear medical horn and anterior horn. Lateral mesicus tear, grade 3 medial;  Surgeon: Dereck Leep, MD;  Location: [facility] ORS;  Service: Orthopedics;  Laterality: Right;   MOLE REMOVAL     benign   TONSILLECTOMY     TOTAL KNEE ARTHROPLASTY Right 02/12/2016   Procedure: RIGHT TOTAL KNEE ARTHROPLASTY;  Surgeon: Gaynelle Arabian, MD;  Location: [facility];  Service: Orthopedics;  Laterality: Right;    There were no vitals filed for this visit.    Subjective Assessment - 09/06/20 0850     Subjective Pt  presetned to OPPT for acute on chronic difficulty with imbalance.    Pertinent History Kiara [name] is an 85yoF    How long can you sit comfortably? not related to this episode    How long can you stand comfortably? not related to this episode    How long can you walk comfortably? not related to this episode    Patient Stated Goals Feel more confident in balance safety.    Currently in Pain? No/denies                [facility] PT Assessment - 09/06/20 0001       Assessment   Medical Diagnosis Unsteadiness; peripheral neuropathy BLE    Referring Provider (PT) Emily Filbert MD (PCP)    Hand Dominance Right    Prior Therapy Here at this clinic last year      Precautions   Precautions None      Restrictions   Weight Bearing Restrictions No      Balance Screen   Has the patient fallen in the past 6 months Yes    How many times? 1   maybe a 2nd fall, unsure   Has the patient had a decrease in activity level because of a fear of falling?  No  Is the patient reluctant to leave their home because of a fear of falling?  No      Home Environment   Living Environment Private residence    Living Arrangements Alone    Type of [place]; level entry; Has a beach house with full flight of stairs (goes monthly)   Home Access Level entry   from [place]; 2 steps at front door   Hornick bars - tub/shower   SPC recommended by neurologist, but she does not use     Prior Function   Level of [place] Retired    Leisure Goes to [employer]; sits and reads      Observation/Other Assessments   Focus on Therapeutic Outcomes (FOTO)  81      Sensation   Light Touch Appears Intact   tactile localization with high acuracy; has established exam from neurology prior to this exam     Functional Tests   Functional tests Single leg stance;Floor to Stand      Single Leg Stance   Comments ~3sec bilat, multiple trails, no  assist needed to recovering LOB   LOB occurs with excessive midfoot valgus past the point of intrinsic arch recovery     Floor to Stand   Comments 10.72sec   supine to standing on red mat     Posture/Postural Control   Posture/Postural Control Postural limitations    Postural Limitations --   excessive resting ankle DF in foam normal stance   Posture Comments airex foam eyes closed reveal of more difficulty with sagitaal plane postural control at the ankle level   correlates well with some early neuropathic proprioceptive changes     ROM / Strength   AROM / PROM / Strength Strength      Strength   Overall Strength Within functional limits for tasks performed    Overall Strength Comments hallux extension, small toe extension: 5/5 bilat   hallux flexion 4+/5 Rt (hallux rigidus); 5/5 Left   Strength Assessment Site Ankle    Right/Left Ankle Right;Left    Right Ankle Dorsiflexion 5/5   also strong when tested in weight bearing   Right Ankle Plantar Flexion 5/5   15+ single leg heel raises   Right Ankle Inversion 4+/5    Right Ankle Eversion 5/5    Left Ankle Dorsiflexion 5/5   also strong when tested in weight bearing   Left Ankle Plantar Flexion --   15+ single leg heel raises   Left Ankle Inversion 5/5    Left Ankle Eversion 5/5      Balance   Balance Assessed Yes      Standardized Balance Assessment   Standardized Balance Assessment Timed Up and Go Test;10 meter walk test    10 Meter Walk 8.60sec (1.21m/s) No device, no LOB, no gross asymmetry   1.27m/s at eval last year (05/15/19)     Merrilee Jansky Balance Test   Standing on One Leg Able to lift leg independently and hold equal to or more than 3 seconds      Timed Up and Go Test   TUG Normal TUG    Normal TUG (seconds) 7.4   12.02 at evaluation last year (05/15/19)     High Level Balance   High Level Balance Activites Other (comment)   normal stance on airex, eyes close, shod x30sec   High Level Balance Comments mild>typical sway, c  3  LOB, self corrected without use of UE   all sway in sagittal plane, righting at ankles, defaults to >typical ankle DF onc eeyes closed (forward lean)                       Objective measurements completed on examination: See above findings.               PT Education - 09/06/20 1239     Education Details SLS limitations are more akin to typical difficulty with mid foot motor control; would like to see existing orthotic insoles form home, despite lack of use.    Person(s) Educated Patient    Methods Explanation;Demonstration    Comprehension Verbalized understanding;Returned demonstration              PT Short Term Goals - 09/06/20 1305       PT SHORT TERM GOAL #1   Title After 4 weeks pt will demonstrate SLS balance >30sec bilat.    Baseline Eval: ~3 sec bilat    Time 4    Period Weeks    Status New    Target Date 10/04/20      PT SHORT TERM GOAL #2   Title Pt to demonstrate backwards walking gait speed >0.43m/s to reveal improved sagittal plane motor control in bilat ankles.    Time 4    Period Weeks    Status New    Target Date 10/04/20      PT SHORT TERM GOAL #3   Title Pt to demonstrate ability to AMB in thick grass at modI level >254ft with LRAD and without LOB.    Baseline difficulty with uneven surfaces, avoids at eval:    Time 4    Period Weeks    Status New    Target Date 10/04/20               PT Long Term Goals - 09/06/20 1317       PT LONG TERM GOAL #1   Title Pt will score 81 or greater on FOTO survey to demonstrate a sustained level of activity tolerance.    Time 8    Period Weeks    Status New    Target Date 11/01/20      PT LONG TERM GOAL #2   Title Pt will demonstrate SLS balance >45 seconds shod to shoe improved ankle/trunk motorcontrol.    Baseline ~3 sec at eval    Time 8    Period Weeks    Status New    Target Date 11/01/20      PT LONG TERM GOAL #3   Title Pt to demonstrate backward walking gait  speed ~ 0.32m/s or greater to show improved facility in ankle motor control in gait.    Time 8    Period Weeks    Status New    Target Date 11/01/20      PT LONG TERM GOAL #4   Title Pt will demonstrate safe and appropriate use of 2 trekking poles for AMB on unlevel, uneven, and soft surfaces to show independent and safe ability to access beach area ad lib.    Baseline low confidence, concerns of safety at eval    Time 8    Period Weeks    Status New    Target Date 11/01/20                    Plan - 09/06/20 1243  Clinical Impression Statement Evaluation demonstrating mild signs of proprioceptive imapirment at the ankle level which are impacting motor control of foot/ankle during gait limiting ability to walk on uneven surfaces, unlevel surfaces, and soft surfaces. Pt has essentially stopped trying to go into the ocean alone, despite going to her beach house monthly. Generally, strength assessment, sensation assessment, tactile localization, functional strength, basic mobility are all slightly better than age-matched norms. SLS time is limited at 3 sec bilat, but was >30sec at her DC assessment last April. No findings in this assessment can correlate her brief episode of gait changes in May that resulted in an ED visit. Pt will benefit from skilled PT services to maximize motor control and righting responses in gait and AMB, to restore patient to PLOF in independent unlimited AMB for ADL, IADL, and leisure.    Personal Factors and Comorbidities Past/Current Experience;Time since onset of injury/illness/exacerbation    Examination-Activity Limitations Locomotion Level;Stairs;Squat    Examination-Participation Restrictions Cleaning;Community Activity;Yard Work;Other   going to [employer] Making Stable/Uncomplicated    Clinical Decision Making High    Rehab Potential Excellent    PT Frequency 2x / week    PT Duration 8 weeks    PT Treatment/Interventions  Balance training;Functional mobility training;Therapeutic activities;Therapeutic exercise;Neuromuscular re-education;Patient/family education;Gait training;Stair training;Ultrasound;Canalith Repostioning;Electrical Stimulation;DME Instruction    PT Next Visit Plan assess vestibular balance limitations, assess side stepping, obtain retroAMB gait speed, assess balance on incline, decline, and banked surfaces; assess AMB in grass    PT Home Exercise Plan SLS 10x5secH bilat, standing all-toes dorsiflexion 15x3secH    Consulted and Agree with Plan of Care Patient             Patient will benefit from skilled therapeutic intervention in order to improve the following deficits and impairments:  Decreased strength, Difficulty walking, Decreased balance, Decreased coordination, Decreased activity tolerance, Decreased knowledge of use of DME, Postural dysfunction, Improper body mechanics  Visit Diagnosis: Unsteadiness on feet     Problem List Patient Active Problem List   Diagnosis Date Noted   Cataract cortical, senile 08/03/2017   Chronic colitis 08/03/2017   Medicare annual wellness visit, initial 12/15/2016   Benign essential hypertension 11/04/2016   OA (osteoarthritis) of knee 02/12/2016   Hyperlipidemia, mixed 10/24/2015   Clostridium difficile colitis 01/02/2014   Right bundle branch block 10/18/2013   1:27 PM, 09/06/20 Etta Grandchild, PT, DPT Physical Therapist - [place] [facility] 09/06/2020, 1:22 PM  [place] [address] [place], [place], [address] Phone: [phone]   Fax:  [phone]  Name: Kiara [name] MRN: [id number] Date of Birth: 12/30/1934

## 2020-09-09 ENCOUNTER — Other Ambulatory Visit: Payer: Self-pay

## 2020-09-09 ENCOUNTER — Ambulatory Visit: Payer: Medicare PPO

## 2020-09-09 DIAGNOSIS — R2681 Unsteadiness on feet: Secondary | ICD-10-CM

## 2020-09-09 DIAGNOSIS — R2689 Other abnormalities of gait and mobility: Secondary | ICD-10-CM

## 2020-09-09 NOTE — Therapy (Signed)
[place] MAIN [facility] SERVICES [address] [place], [place], [address] Phone: [phone]   Fax:  [phone]  Physical Therapy Treatment  Patient Details  Name: Kiara [name] MRN: [id number] Date of Birth: 06-30-1934 Referring Provider (PT): Emily Filbert MD (PCP)   Encounter Date: 09/09/2020   PT End of Session - 09/09/20 1029     Visit Number 2    Number of Visits 16    Date for PT Re-Evaluation 10/04/20    Authorization Type [employer] Medicare Choice PPO    Authorization Time Period 09/06/20-11/01/20    PT Start Time 0900    PT Stop Time 0930    PT Time Calculation (min) 30 min    Equipment Utilized During Treatment Gait belt    Activity Tolerance Patient tolerated treatment well    Behavior During Therapy [facility] for tasks assessed/performed             Past Medical History:  Diagnosis Date   Arthritis    osteoarthritis -right knee.   C. difficile colitis    past,no recent issues   Colitis    Diverticulitis    Fall    "tripped"-10 days ago-scrapped right knee and lateral right brow area-" healing"   GERD (gastroesophageal reflux disease)    occ.   Seasonal allergies     Past Surgical History:  Procedure Laterality Date   APPENDECTOMY     CATARACT EXTRACTION, BILATERAL     EYE SURGERY     ? right eye retina repair.   HEMORRHOID SURGERY     KNEE ARTHROSCOPY Right 11/26/2014   Procedure: ARTHROSCOPY KNEE, Tear medical horn and anterior horn. Lateral mesicus tear, grade 3 medial;  Surgeon: Dereck Leep, MD;  Location: [facility] ORS;  Service: Orthopedics;  Laterality: Right;   MOLE REMOVAL     benign   TONSILLECTOMY     TOTAL KNEE ARTHROPLASTY Right 02/12/2016   Procedure: RIGHT TOTAL KNEE ARTHROPLASTY;  Surgeon: Gaynelle Arabian, MD;  Location: [facility];  Service: Orthopedics;  Laterality: Right;    There were no vitals filed for this visit.   Subjective Assessment - 09/09/20 0900     Subjective Pt reports today she feels OK.  Pt reports her daughter is at [employer] today d/t  a-fib. Pt reports no dizziness. Pt reports occassoinal ear pain (none currently), she reports 3x/week she feels ear fullness. Pt reports she has seen an ENT before (as recently as 5 months). She denies ringing in her ears.    Pertinent History Kiara [name] is an 53yoF who comes to [place] referred by PCP for imbalance in walking. 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 Pt also had an acute onset transient episode of AMB 1 month prior wherein she could only AMB in a circle, but not in a straight line. This occurred while out walking a dog with friend, ultimately sustained a fall onto Rt hand and Rt hip, no injury sustained. Pt goes to her beach house each month and recently has felt less safe AMB on soft sand, also no longer attempts to access water alone due to imbalance.    How long can you sit comfortably? not related to this episode    How long can you stand comfortably? not related to this episode    How long can you walk comfortably? not related to this episode    Patient Stated Goals Feel more  confident in walking at the beach and be able to safely access the ocean.    Currently in Pain? No/denies            TREATMENT  Modified CTSIB - able to maintain balance in conditions 1, 2, and 3 but loses balance in condition 4. Requiring min a and UE assist to regain balance.  Retro amb: pt ambulates 0.67 m/s , CGA  SLS 2x30 sec each LE in // bars; intermittent UE support  Tandem stance 2x30 sec in // bars; intermittent UE support  Agility ladder - forward/backward, lateral and high-knee marches. Challenged by all, but most with backward amb/accuracy. Demonstrates increased adduction.   Red mat/uneven surface - forward/backward, lateral stepping, and narrow walking. Pt rates walking with feet close together as most challenging.      Pt educated throughout  session about proper posture and technique with exercises. Improved exercise technique, movement at target joints, use of target muscles after min to mod verbal, visual, tactile cues.  PT Education - 09/09/20 1028     Education Details Pt educated on exercise technique, body mechanics    Person(s) Educated Patient    Methods Explanation;Demonstration    Comprehension Verbalized understanding;Returned demonstration              PT Short Term Goals - 09/09/20 1032       PT SHORT TERM GOAL #1   Title After 4 weeks pt will demonstrate SLS balance >30sec bilat.    Baseline Eval: ~3 sec bilat    Time 4    Period Weeks    Status New    Target Date 10/04/20      PT SHORT TERM GOAL #2   Title Pt to demonstrate backwards walking gait speed >0.39m/s to reveal improved sagittal plane motor control in bilat ankles.    Baseline 6/13: 0.67 m/s    Time 4    Period Weeks    Status New    Target Date 10/04/20      PT SHORT TERM GOAL #3   Title Pt to demonstrate ability to AMB in thick grass at modI level >247ft with LRAD and without LOB.    Baseline difficulty with uneven surfaces, avoids at eval:    Time 4    Period Weeks    Status New    Target Date 10/04/20               PT Long Term Goals - 09/09/20 1033       PT LONG TERM GOAL #1   Title Pt will score 81 or greater on FOTO survey to demonstrate a sustained level of activity tolerance.    Time 8    Period Weeks    Status New    Target Date 11/01/20      PT LONG TERM GOAL #2   Title Pt will demonstrate SLS balance >45 seconds shod to shoe improved ankle/trunk motorcontrol.    Baseline ~3 sec at eval    Time 8    Period Weeks    Status New    Target Date 11/01/20      PT LONG TERM GOAL #3   Title Pt to demonstrate backward walking gait speed ~ 0.43m/s or greater to show improved facility in ankle motor control in gait.    Baseline 6/13: 0.67 m/s    Time 8    Period Weeks    Status New    Target Date 11/01/20  PT LONG TERM GOAL #4   Title Pt will demonstrate safe and appropriate use of 2 trekking poles for AMB on unlevel, uneven, and soft surfaces to show independent and safe ability to access beach area ad lib.    Baseline low confidence, concerns of safety at eval    Time 8    Period Weeks    Status New    Target Date 11/01/20                   Plan - 09/09/20 1029     Clinical Impression Statement PT session significanlty limited d/t pt arrival time. Further assessment completed: pt able to perform all conditions on CTSIB-M except for condition 4, where pt loses balance requiring min a from PT and UE support to regain balance. Pt retro amb. speed assessed as 0.67 m/s. Pt abliity to amb. incline/decline and on grassy surfaces to be tested future session. Pt will benefit from further skilled PT to improve balance in order to decrease fall risk.    Personal Factors and Comorbidities Past/Current Experience;Time since onset of injury/illness/exacerbation    Examination-Activity Limitations Locomotion Level;Stairs;Squat    Examination-Participation Restrictions Cleaning;Community Activity;Yard Work;Other   going to [employer] Making Stable/Uncomplicated    Rehab Potential Excellent    PT Frequency 2x / week    PT Duration 8 weeks    PT Treatment/Interventions Balance training;Functional mobility training;Therapeutic activities;Therapeutic exercise;Neuromuscular re-education;Patient/family education;Gait training;Stair training;Ultrasound;Canalith Repostioning;Electrical Stimulation;DME Instruction    PT Next Visit Plan assess vestibular balance limitations, assess side stepping, obtain retroAMB gait speed, assess balance on incline, decline, and banked surfaces; assess AMB in grass    PT Home Exercise Plan SLS 10x5secH bilat, standing all-toes dorsiflexion 15x3secH    Consulted and Agree with Plan of Care Patient             Patient will benefit from  skilled therapeutic intervention in order to improve the following deficits and impairments:  Decreased strength, Difficulty walking, Decreased balance, Decreased coordination, Decreased activity tolerance, Decreased knowledge of use of DME, Postural dysfunction, Improper body mechanics  Visit Diagnosis: Unsteadiness on feet  Other abnormalities of gait and mobility     Problem List Patient Active Problem List   Diagnosis Date Noted   Cataract cortical, senile 08/03/2017   Chronic colitis 08/03/2017   Medicare annual wellness visit, initial 12/15/2016   Benign essential hypertension 11/04/2016   OA (osteoarthritis) of knee 02/12/2016   Hyperlipidemia, mixed 10/24/2015   Clostridium difficile colitis 01/02/2014   Right bundle branch block 10/18/2013   Ricard Dillon PT, DPT 09/09/2020, 10:36 AM  [place], [place], [address] Phone: [phone]   Fax:  [phone]  Name: VERNELL BACK MRN: [id number] Date of Birth: [birth year]

## 2020-09-13 ENCOUNTER — Ambulatory Visit: Payer: Medicare PPO

## 2020-09-13 ENCOUNTER — Ambulatory Visit
Admission: RE | Admit: 2020-09-13 | Discharge: 2020-09-13 | Disposition: A | Discharge status: Home / Self Care | Payer: Medicare PPO | Source: Ambulatory Visit | Attending: Internal Medicine | Admitting: Internal Medicine

## 2020-09-13 ENCOUNTER — Other Ambulatory Visit: Payer: Self-pay

## 2020-09-13 DIAGNOSIS — R918 Other nonspecific abnormal finding of lung field: Secondary | ICD-10-CM | POA: Insufficient documentation

## 2020-09-13 DIAGNOSIS — R2681 Unsteadiness on feet: Secondary | ICD-10-CM | POA: Diagnosis not present

## 2020-09-13 DIAGNOSIS — R2689 Other abnormalities of gait and mobility: Secondary | ICD-10-CM

## 2020-09-13 NOTE — Therapy (Signed)
[place] MAIN [facility] SERVICES [address] [place], [place], [address] Phone: [phone]   Fax:  [phone]  Physical Therapy Treatment  Patient Details  Name: Kiara [name] MRN: [id number] Date of Birth: 21-Feb-1935 Referring Provider (PT): Emily Filbert MD (PCP)   Encounter Date: 09/13/2020   PT End of Session - 09/13/20 1146     Visit Number 3    Number of Visits 16    Date for PT Re-Evaluation 10/04/20    Authorization Type [employer] PPO    Authorization Time Period 09/06/20-11/01/20    PT Start Time 0802    PT Stop Time 0845    PT Time Calculation (min) 43 min    Equipment Utilized During Treatment Gait belt    Activity Tolerance Patient tolerated treatment well    Behavior During Therapy [facility] for tasks assessed/performed             Past Medical History:  Diagnosis Date   Arthritis    osteoarthritis -right knee.   C. difficile colitis    past,no recent issues   Colitis    Diverticulitis    Fall    "tripped"-10 days ago-scrapped right knee and lateral right brow area-" healing"   GERD (gastroesophageal reflux disease)    occ.   Seasonal allergies     Past Surgical History:  Procedure Laterality Date   APPENDECTOMY     CATARACT EXTRACTION, BILATERAL     EYE SURGERY     ? right eye retina repair.   HEMORRHOID SURGERY     KNEE ARTHROSCOPY Right 11/26/2014   Procedure: ARTHROSCOPY KNEE, Tear medical horn and anterior horn. Lateral mesicus tear, grade 3 medial;  Surgeon: Dereck Leep, MD;  Location: [facility] ORS;  Service: Orthopedics;  Laterality: Right;   MOLE REMOVAL     benign   TONSILLECTOMY     TOTAL KNEE ARTHROPLASTY Right 02/12/2016   Procedure: RIGHT TOTAL KNEE ARTHROPLASTY;  Surgeon: Gaynelle Arabian, MD;  Location: [facility];  Service: Orthopedics;  Laterality: Right;    There were no vitals filed for this visit.   Subjective Assessment - 09/13/20 0802     Subjective Pt reports no major changes  since previous session. She has brought orthotics today she says were prescribed several years ago when she was seeing a chiropractor, possibly for knee pain, but she is unsure. She states she has not used them in a long time.    Pertinent History Kiara [name] is [name] 89yoF who comes to [facility] referred by PCP for imbalance in walking. 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 Pt also had [name] acute onset transient episode of AMB 1 month prior wherein she could only AMB in a circle, but not in a straight line. This occurred while out walking a dog with friend, ultimately sustained a fall onto Rt hand and Rt hip, no injury sustained. Pt goes to her beach house each month and recently has felt less safe AMB on soft sand, also no longer attempts to access water alone due to imbalance.    How long can you sit comfortably? not related to this episode    How long can you stand comfortably? not related to this episode    How long can you walk comfortably? not related to this episode    Patient Stated Goals Feel more confident in walking at the beach and be able  to safely access the ocean.    Currently in Pain? No/denies             TREATMENT  Pt brought in orthotics she received from chiropractor years ago. Upon initial gait analysis without orthotics pt reports no pain and no significant gait abnormality noted.   Pt with orthotics demos increased LLE pronation and increased R hip drop. Pt reports orthotic is uncomfortable on L. PT does not currently recommend pt reuse these orthotics.  CGA provided for the following:  Airex Pad: NBOS EC - 4x30 sec, LOB x multiple times, leans to R or anterior directions NBOS EO horizontal head turns - 10x B directions NBOS EO vertical head turns - 2x10  B directions- LOB 1x, uses UE assist to regain balance and CGA from PT SLB - 2x20 sec Modified SLB (one foot on airex, one on  step) - 2x30 sec each -with vertical head turns; more challenging 10x B directions -with horizontal head turns 10x B directions  Cone taps and hodgehog tap; rates challenging - x multiple reps each LE  Forward/backward stepping over hurdle; takes multiple small steps going backward  Backward walking in // bars - 6x. No decrease in postural stability.  STS with airex under feet 1x8, and addition of dynadisc on seat 1x5. No decrease in postural stability  Tandem walking on airex beam - 6x; frequent use of UE support on bars d/t LOB to regain balance.    Pt educated throughout session about proper posture and technique with exercises. Improved exercise technique, movement at target joints, use of target muscles after min to mod verbal, visual, tactile cues.     PT Education - 09/13/20 1145     Education Details exercise technique, body mechanics    Person(s) Educated Patient    Methods Explanation;Demonstration;Verbal cues    Comprehension Verbalized understanding;Returned demonstration              PT Short Term Goals - 09/09/20 1032       PT SHORT TERM GOAL #1   Title After 4 weeks pt will demonstrate SLS balance >30sec bilat.    Baseline Eval: ~3 sec bilat    Time 4    Period Weeks    Status New    Target Date 10/04/20      PT SHORT TERM GOAL #2   Title Pt to demonstrate backwards walking gait speed >0.18m/s to reveal improved sagittal plane motor control in bilat ankles.    Baseline 6/13: 0.67 m/s    Time 4    Period Weeks    Status New    Target Date 10/04/20      PT SHORT TERM GOAL #3   Title Pt to demonstrate ability to AMB in thick grass at modI level >26ft with LRAD and without LOB.    Baseline difficulty with uneven surfaces, avoids at eval:    Time 4    Period Weeks    Status New    Target Date 10/04/20               PT Long Term Goals - 09/09/20 1033       PT LONG TERM GOAL #1   Title Pt will score 81 or greater on FOTO survey to  demonstrate a sustained level of activity tolerance.    Time 8    Period Weeks    Status New    Target Date 11/01/20      PT LONG TERM GOAL #2  Title Pt will demonstrate SLS balance >45 seconds shod to shoe improved ankle/trunk motorcontrol.    Baseline ~3 sec at eval    Time 8    Period Weeks    Status New    Target Date 11/01/20      PT LONG TERM GOAL #3   Title Pt to demonstrate backward walking gait speed ~ 0.51m/s or greater to show improved facility in ankle motor control in gait.    Baseline 6/13: 0.67 m/s    Time 8    Period Weeks    Status New    Target Date 11/01/20      PT LONG TERM GOAL #4   Title Pt will demonstrate safe and appropriate use of 2 trekking poles for AMB on unlevel, uneven, and soft surfaces to show independent and safe ability to access beach area ad lib.    Baseline low confidence, concerns of safety at eval    Time 8    Period Weeks    Status New    Target Date 11/01/20                   Plan - 09/13/20 1146     Clinical Impression Statement Pt brought in old orthotics today for gait assessment. After assessment with orthotics PT does not currently recommend pt use them (see note for details). Pt challenged with all SLB tasks, including modified/SLB progression and cone taps with hedhog taps. The pt will benefit from further skilled PT to improve balance in order to decrease fall risk.    Personal Factors and Comorbidities Past/Current Experience;Time since onset of injury/illness/exacerbation    Examination-Activity Limitations Locomotion Level;Stairs;Squat    Examination-Participation Restrictions Cleaning;Community Activity;Yard Work;Other   going to [employer] Making Stable/Uncomplicated    Rehab Potential Excellent    PT Frequency 2x / week    PT Duration 8 weeks    PT Treatment/Interventions Balance training;Functional mobility training;Therapeutic activities;Therapeutic exercise;Neuromuscular  re-education;Patient/family education;Gait training;Stair training;Ultrasound;Canalith Repostioning;Electrical Stimulation;DME Instruction    PT Next Visit Plan assess vestibular balance limitations, assess side stepping, obtain retroAMB gait speed, assess balance on incline, decline, and banked surfaces; assess AMB in grass    PT Home Exercise Plan SLS 10x5secH bilat, standing all-toes dorsiflexion 15x3secH    Consulted and Agree with Plan of Care Patient             Patient will benefit from skilled therapeutic intervention in order to improve the following deficits and impairments:  Decreased strength, Difficulty walking, Decreased balance, Decreased coordination, Decreased activity tolerance, Decreased knowledge of use of DME, Postural dysfunction, Improper body mechanics  Visit Diagnosis: Unsteadiness on feet  Other abnormalities of gait and mobility     Problem List Patient Active Problem List   Diagnosis Date Noted   Cataract cortical, senile 08/03/2017   Chronic colitis 08/03/2017   Medicare annual wellness visit, initial 12/15/2016   Benign essential hypertension 11/04/2016   OA (osteoarthritis) of knee 02/12/2016   Hyperlipidemia, mixed 10/24/2015   Clostridium difficile colitis 01/02/2014   Right bundle branch block 10/18/2013   Ricard Dillon PT, DPT 09/13/2020, 11:54 AM  [place] [address] [place], [place], [address] Phone: [phone]   Fax:  [phone]  Name: SHATERICA MCCLATCHY MRN: [id number] Date of Birth: 07-Aug-1934

## 2020-09-16 ENCOUNTER — Ambulatory Visit: Payer: Medicare PPO

## 2020-09-16 ENCOUNTER — Other Ambulatory Visit: Payer: Self-pay

## 2020-09-16 DIAGNOSIS — R2689 Other abnormalities of gait and mobility: Secondary | ICD-10-CM

## 2020-09-16 DIAGNOSIS — R2681 Unsteadiness on feet: Secondary | ICD-10-CM | POA: Diagnosis not present

## 2020-09-16 DIAGNOSIS — R278 Other lack of coordination: Secondary | ICD-10-CM

## 2020-09-16 NOTE — Therapy (Signed)
[place] MAIN [facility] SERVICES [address] [place], [place], [address] Phone: [phone]   Fax:  [phone]  Physical Therapy Treatment  Patient Details  Name: Kiara [name] MRN: [id number] Date of Birth: 10-01-1934 Referring Provider (PT): Emily Filbert MD (PCP)   Encounter Date: 09/16/2020   PT End of Session - 09/16/20 0929     Visit Number 4    Number of Visits 16    Date for PT Re-Evaluation 10/04/20    Authorization Type [employer] Choice PPO    Authorization Time Period 09/06/20-11/01/20    PT Start Time 0856    PT Stop Time 0926    PT Time Calculation (min) 30 min    Equipment Utilized During Treatment Gait belt    Activity Tolerance Patient tolerated treatment well    Behavior During Therapy [facility] for tasks assessed/performed             Past Medical History:  Diagnosis Date   Arthritis    osteoarthritis -right knee.   C. difficile colitis    past,no recent issues   Colitis    Diverticulitis    Fall    "tripped"-10 days ago-scrapped right knee and lateral right brow area-" healing"   GERD (gastroesophageal reflux disease)    occ.   Seasonal allergies     Past Surgical History:  Procedure Laterality Date   APPENDECTOMY     CATARACT EXTRACTION, BILATERAL     EYE SURGERY     ? right eye retina repair.   HEMORRHOID SURGERY     KNEE ARTHROSCOPY Right 11/26/2014   Procedure: ARTHROSCOPY KNEE, Tear medical horn and anterior horn. Lateral mesicus tear, grade 3 medial;  Surgeon: Dereck Leep, MD;  Location: [facility] ORS;  Service: Orthopedics;  Laterality: Right;   MOLE REMOVAL     benign   TONSILLECTOMY     TOTAL KNEE ARTHROPLASTY Right 02/12/2016   Procedure: RIGHT TOTAL KNEE ARTHROPLASTY;  Surgeon: Gaynelle Arabian, MD;  Location: [facility];  Service: Orthopedics;  Laterality: Right;    There were no vitals filed for this visit.   Subjective Assessment - 09/16/20 0856     Subjective Pt reports no major changes  since she was last here. She reports her R leg bothers her at night but doesn't bother her during the day. She says it feels achey and tingly at night. Pt reports she did not do HEP.    Pertinent History Kiara [name] is an 25yoF who comes to [place] referred by PCP for imbalance in walking. 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 Pt also had an acute onset transient episode of AMB 1 month prior wherein she could only AMB in a circle, but not in a straight line. This occurred while out walking a dog with friend, ultimately sustained a fall onto Rt hand and Rt hip, no injury sustained. Pt goes to her beach house each month and recently has felt less safe AMB on soft sand, also no longer attempts to access water alone due to imbalance.    How long can you sit comfortably? not related to this episode    How long can you stand comfortably? not related to this episode    How long can you walk comfortably? not related to this episode    Patient Stated Goals Feel more confident in walking at the beach and be able to safely access  the ocean.    Currently in Pain? No/denies                 TREATMENT   NEURO- CGA provided for the following: Side-stepping over orange hurdle - 2x15 Standing between two cones with side-tapping 2x10 each; pt rates difficult Stepping forward/backward over hurdle - 2x15; improved since previous session  Airex Pad: NBOS EC - 4x30 sec, continued LOB x multiple times, leans to R or anterior directions NBOS EO horizontal head turns - 2x15 B directions - increased sway NBOS EO vertical head turns - 2x15  B directions- increased sway    Korebalance - CGA throughout  [place] racer 5x - difficulty utilizing ankle ROM (see assessment)  Ankle ROM assessment:  Pt achieves 3 deg. Ankle DF B Pt achieves approx. 50-55 deg. PF B  Pt educated throughout session about proper posture and  technique with exercises. Improved exercise technique, movement at target joints, use of target muscles after min to mod verbal, visual, tactile cues.    Assessment: Session limited d/t pt arriving approx. 10 minutes after start of appointment. Pt more challenged with retro-stepping over hurdle with leading with R LE and supporting herself on LLE, often requiring step-strategy to regain balance or small steps. Pt continues to be most challenged with EC on foam, where she has difficulty weight-shifting to L side and loses balance to R, requiring min a and UE support to regain balance. Pt has difficulty utilizing ankle ROM on TUX racer, exhibiting poor weight-shifts, and slides or picks up feet to compensate. Pt ankle DF ROM slightly limited B..    PT Education - 09/16/20 0928     Education Details exercise technique/body mechanics with Korebalance training/ankle strategies    Person(s) Educated Patient    Methods Explanation;Verbal cues    Comprehension Verbalized understanding;Returned demonstration;Need further instruction              PT Short Term Goals - 09/09/20 1032       PT SHORT TERM GOAL #1   Title After 4 weeks pt will demonstrate SLS balance >30sec bilat.    Baseline Eval: ~3 sec bilat    Time 4    Period Weeks    Status New    Target Date 10/04/20      PT SHORT TERM GOAL #2   Title Pt to demonstrate backwards walking gait speed >0.66m/s to reveal improved sagittal plane motor control in bilat ankles.    Baseline 6/13: 0.67 m/s    Time 4    Period Weeks    Status New    Target Date 10/04/20      PT SHORT TERM GOAL #3   Title Pt to demonstrate ability to AMB in thick grass at modI level >230ft with LRAD and without LOB.    Baseline difficulty with uneven surfaces, avoids at eval:    Time 4    Period Weeks    Status New    Target Date 10/04/20               PT Long Term Goals - 09/09/20 1033       PT LONG TERM GOAL #1   Title Pt will score 81 or  greater on FOTO survey to demonstrate a sustained level of activity tolerance.    Time 8    Period Weeks    Status New    Target Date 11/01/20      PT LONG TERM GOAL #2   Title Pt will  demonstrate SLS balance >45 seconds shod to shoe improved ankle/trunk motorcontrol.    Baseline ~3 sec at eval    Time 8    Period Weeks    Status New    Target Date 11/01/20      PT LONG TERM GOAL #3   Title Pt to demonstrate backward walking gait speed ~ 0.54m/s or greater to show improved facility in ankle motor control in gait.    Baseline 6/13: 0.67 m/s    Time 8    Period Weeks    Status New    Target Date 11/01/20      PT LONG TERM GOAL #4   Title Pt will demonstrate safe and appropriate use of 2 trekking poles for AMB on unlevel, uneven, and soft surfaces to show independent and safe ability to access beach area ad lib.    Baseline low confidence, concerns of safety at eval    Time 8    Period Weeks    Status New    Target Date 11/01/20                   Plan - 09/16/20 0929     Clinical Impression Statement Session limited d/t pt arriving approx. 10 minutes after start of appointment. Pt more challenged with retro-stepping over hurdle with leading with R LE and supporting herself on LLE, often requiring step-strategy to regain balance or small steps. Pt continues to be most challenged with EC on foam, where she has difficulty weight-shifting to L side and loses balance to R, requiring min a and UE support to regain balance. Pt has difficulty utilizing ankle ROM on TUX racer, exhibiting poor weight-shifts, and slides or picks up feet to compensate. Pt ankle DF ROM slightly limited B..    Personal Factors and Comorbidities Past/Current Experience;Time since onset of injury/illness/exacerbation    Examination-Activity Limitations Locomotion Level;Stairs;Squat    Examination-Participation Restrictions Cleaning;Community Activity;Yard Work;Other   going to [employer] Making Stable/Uncomplicated    Rehab Potential Excellent    PT Frequency 2x / week    PT Duration 8 weeks    PT Treatment/Interventions Balance training;Functional mobility training;Therapeutic activities;Therapeutic exercise;Neuromuscular re-education;Patient/family education;Gait training;Stair training;Ultrasound;Canalith Repostioning;Electrical Stimulation;DME Instruction    PT Next Visit Plan assess vestibular balance limitations, assess side stepping, obtain retroAMB gait speed, assess balance on incline, decline, and banked surfaces; assess AMB in grass, ankle inv/ev measurement    PT Home Exercise Plan SLS 10x5secH bilat, standing all-toes dorsiflexion 15x3secH    Consulted and Agree with Plan of Care Patient             Patient will benefit from skilled therapeutic intervention in order to improve the following deficits and impairments:  Decreased strength, Difficulty walking, Decreased balance, Decreased coordination, Decreased activity tolerance, Decreased knowledge of use of DME, Postural dysfunction, Improper body mechanics  Visit Diagnosis: Unsteadiness on feet  Other lack of coordination  Other abnormalities of gait and mobility     Problem List Patient Active Problem List   Diagnosis Date Noted   Cataract cortical, senile 08/03/2017   Chronic colitis 08/03/2017   Medicare annual wellness visit, initial 12/15/2016   Benign essential hypertension 11/04/2016   OA (osteoarthritis) of knee 02/12/2016   Hyperlipidemia, mixed 10/24/2015   Clostridium difficile colitis 01/02/2014   Right bundle branch block 10/18/2013   Ricard Dillon PT, DPT 09/16/2020, 9:32 AM  [place], [place],  [place] Phone: [phone]   Fax:  [phone]  Name: SALAM CHESTERFIELD MRN: [id number] Date of Birth: 05/05/1934

## 2020-09-18 ENCOUNTER — Ambulatory Visit: Payer: Medicare PPO

## 2020-09-18 ENCOUNTER — Other Ambulatory Visit: Payer: Self-pay

## 2020-09-18 DIAGNOSIS — R2689 Other abnormalities of gait and mobility: Secondary | ICD-10-CM

## 2020-09-18 DIAGNOSIS — M6281 Muscle weakness (generalized): Secondary | ICD-10-CM

## 2020-09-18 DIAGNOSIS — R2681 Unsteadiness on feet: Secondary | ICD-10-CM | POA: Diagnosis not present

## 2020-09-18 NOTE — Therapy (Signed)
[place] MAIN [facility] SERVICES [address] [place], [place], [address] Phone: [phone]   Fax:  [phone]  Physical Therapy Treatment  Patient Details  Name: Kiara [name] MRN: [id number] Date of Birth: [birth year] Referring Provider (PT): Emily Filbert MD (PCP)   Encounter Date: 09/18/2020   PT End of Session - 09/18/20 0854     Visit Number 5    Number of Visits 16    Date for PT Re-Evaluation 10/04/20    Authorization Type [employer] PPO    Authorization Time Period 09/06/20-11/01/20    PT Start Time 0806    PT Stop Time 0848    PT Time Calculation (min) 42 min    Equipment Utilized During Treatment Gait belt    Activity Tolerance Patient tolerated treatment well    Behavior During Therapy [facility] for tasks assessed/performed             Past Medical History:  Diagnosis Date   Arthritis    osteoarthritis -right knee.   C. difficile colitis    past,no recent issues   Colitis    Diverticulitis    Fall    "tripped"-10 days ago-scrapped right knee and lateral right brow area-" healing"   GERD (gastroesophageal reflux disease)    occ.   Seasonal allergies     Past Surgical History:  Procedure Laterality Date   APPENDECTOMY     CATARACT EXTRACTION, BILATERAL     EYE SURGERY     ? right eye retina repair.   HEMORRHOID SURGERY     KNEE ARTHROSCOPY Right 11/26/2014   Procedure: ARTHROSCOPY KNEE, Tear medical horn and anterior horn. Lateral mesicus tear, grade 3 medial;  Surgeon: Dereck Leep, MD;  Location: [facility] ORS;  Service: Orthopedics;  Laterality: Right;   MOLE REMOVAL     benign   TONSILLECTOMY     TOTAL KNEE ARTHROPLASTY Right 02/12/2016   Procedure: RIGHT TOTAL KNEE ARTHROPLASTY;  Surgeon: Gaynelle Arabian, MD;  Location: [facility];  Service: Orthopedics;  Laterality: Right;    There were no vitals filed for this visit.   Subjective Assessment - 09/18/20 0807     Subjective Pt reports continued ache in  RLE. She feels it comes from near the hip. She says it is not hurtint currently. Pt reports no falls or near-falls, but says she was "unsteady on my feet yesterday. I don't know why."    Pertinent History Kiara [name] is an 23yoF who comes to [place] referred by PCP for imbalance in walking. 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 Pt also had an acute onset transient episode of AMB 1 month prior wherein she could only AMB in a circle, but not in a straight line. This occurred while out walking a dog with friend, ultimately sustained a fall onto Rt hand and Rt hip, no injury sustained. Pt goes to her beach house each month and recently has felt less safe AMB on soft sand, also no longer attempts to access water alone due to imbalance.    How long can you sit comfortably? not related to this episode    How long can you stand comfortably? not related to this episode    How long can you walk comfortably? not related to this episode    Patient Stated Goals Feel more confident in walking at the beach and be able to safely access the ocean.  Currently in Pain? No/denies              TREATMENT   NEURO- CGA provided for the following exercises this session-- Standing between two cones with side-tapping 2x10 each;  Stepping forward/backward over hurdle - 2x15; improved since previous session Airex Pad: NBOS EC - 4x30 sec, continued LOB x multiple times, increased RLE weightbearing, pushing to L Side Lunge with one foot on airex 5x each LE, discontinued t/d discomfort in R knee NBOS EO horizontal head turns - 2x10 B directions  NBOS EO vertical head turns - 2x10 B directions  In // bars: CGA provided for all High knee marches, emphasis SLB with dynamic task - 8x. Improvement with reps.    Stairs with decreasing levels of UE support 6x ascending/descending. Without UE support uses step-to pattern  descending.  Step-downs from 6" step without UE support 2x10 each LE, less stability with LLE as stance leg.   Standing gastrocsol stretches 2x30 sec each LE  Pt educated throughout session about proper posture and technique with exercises. Improved exercise technique, movement at target joints, use of target muscles after min to mod verbal, visual, tactile cues.   Assessment: With all exercises pt exhibits decreased stability with LLE as stance leg compared to R. Pt still challenged with all SLB tasks, but was able to demo improvement with technique within session, particularly with step-downs. Gastrocsol. Stretches added to HEP this session to address dorsiflexion deficits (see note). The pt will benefit from further skilled PT to improve balance in order to decrease fall risk.    Access Code: [email or web address] URL: [email or web address] Date: 09/18/2020 Prepared by: Ricard Dillon  Exercises Seated Calf Stretch with Strap - 1 x daily - 7 x weekly - 1 sets - 2 reps - 30 hold       PT Short Term Goals - 09/09/20 1032       PT SHORT TERM GOAL #1   Title After 4 weeks pt will demonstrate SLS balance >30sec bilat.    Baseline Eval: ~3 sec bilat    Time 4    Period Weeks    Status New    Target Date 10/04/20      PT SHORT TERM GOAL #2   Title Pt to demonstrate backwards walking gait speed >0.25m/s to reveal improved sagittal plane motor control in bilat ankles.    Baseline 6/13: 0.67 m/s    Time 4    Period Weeks    Status New    Target Date 10/04/20      PT SHORT TERM GOAL #3   Title Pt to demonstrate ability to AMB in thick grass at modI level >225ft with LRAD and without LOB.    Baseline difficulty with uneven surfaces, avoids at eval:    Time 4    Period Weeks    Status New    Target Date 10/04/20               PT Long Term Goals - 09/09/20 1033       PT LONG TERM GOAL #1   Title Pt will score 81 or greater on FOTO survey to demonstrate a sustained  level of activity tolerance.    Time 8    Period Weeks    Status New    Target Date 11/01/20      PT LONG TERM GOAL #2   Title Pt will demonstrate SLS balance >45 seconds shod to shoe improved ankle/trunk motorcontrol.    Baseline ~3 sec  at eval    Time 8    Period Weeks    Status New    Target Date 11/01/20      PT LONG TERM GOAL #3   Title Pt to demonstrate backward walking gait speed ~ 0.48m/s or greater to show improved facility in ankle motor control in gait.    Baseline 6/13: 0.67 m/s    Time 8    Period Weeks    Status New    Target Date 11/01/20      PT LONG TERM GOAL #4   Title Pt will demonstrate safe and appropriate use of 2 trekking poles for AMB on unlevel, uneven, and soft surfaces to show independent and safe ability to access beach area ad lib.    Baseline low confidence, concerns of safety at eval    Time 8    Period Weeks    Status New    Target Date 11/01/20                   Plan - 09/18/20 0851     Clinical Impression Statement With all exercises pt exhibits decreased stability with LLE as stance leg compared to R. Pt still challenged with all SLB tasks, but was able to demo improvement with technique within session, particularly with step-downs. Gastrocsol. Stretches added to HEP this session to address dorsiflexion deficits (see note). The pt will benefit from further skilled PT to improve balance in order to decrease fall risk.    Personal Factors and Comorbidities Past/Current Experience;Time since onset of injury/illness/exacerbation    Examination-Activity Limitations Locomotion Level;Stairs;Squat    Examination-Participation Restrictions Cleaning;Community Activity;Yard Work;Other   going to [employer] Making Stable/Uncomplicated    Rehab Potential Excellent    PT Frequency 2x / week    PT Duration 8 weeks    PT Treatment/Interventions Balance training;Functional mobility training;Therapeutic activities;Therapeutic  exercise;Neuromuscular re-education;Patient/family education;Gait training;Stair training;Ultrasound;Canalith Repostioning;Electrical Stimulation;DME Instruction    PT Next Visit Plan assess vestibular balance limitations, assess side stepping, obtain retroAMB gait speed, assess balance on incline, decline, and banked surfaces; assess AMB in grass, ankle inv/ev measurement    PT Home Exercise Plan SLS 10x5secH bilat, standing all-toes dorsiflexion 15x3secH;   ?Access Code: [email or web address]    Consulted and Agree with Plan of Care Patient             Patient will benefit from skilled therapeutic intervention in order to improve the following deficits and impairments:  Decreased strength, Difficulty walking, Decreased balance, Decreased coordination, Decreased activity tolerance, Decreased knowledge of use of DME, Postural dysfunction, Improper body mechanics  Visit Diagnosis: Unsteadiness on feet  Muscle weakness (generalized)  Other abnormalities of gait and mobility     Problem List Patient Active Problem List   Diagnosis Date Noted   Cataract cortical, senile 08/03/2017   Chronic colitis 08/03/2017   Medicare annual wellness visit, initial 12/15/2016   Benign essential hypertension 11/04/2016   OA (osteoarthritis) of knee 02/12/2016   Hyperlipidemia, mixed 10/24/2015   Clostridium difficile colitis 01/02/2014   Right bundle branch block 10/18/2013   Ricard Dillon PT, DPT 09/18/2020, 8:55 AM  [place] [address] [place], [place], [address] Phone: [phone]   Fax:  [phone]  Name: Kiara [name] MRN: [id number] Date of Birth: 1934-10-23

## 2020-09-20 NOTE — Therapy (Signed)
[place] MAIN [facility] SERVICES [address] [place], [place], [address] Phone: [phone]   Fax:  [phone]  Patient Details  Name: HILJA KINTZEL MRN: [id number] Date of Birth: [birth year] Referring Provider:  No ref. provider found  Encounter Date: 09/20/2020  Pt called therapy office on this date. PT returned pt's call on this date on requested number and was able to reach pt. Pt reports since last visit she has had increased L knee pain and thinks it might be due to one of the exercises performed during her most recent appointment. Pt reports walking and use of ice has helped her knee pain. PT instructed pt not to perform HEP this weekend and to modify activities in order to rest L knee. However, PT did instruct pt in performing activities that pt reports has made her knee feel better, such as light walking and use of ice. PT instructed pt to discontinue activities this weekend that increase knee pain. PT instructed pt in safe use of ice for pain modulation. Pt verbalized understanding for all instruction. PT to further assess L knee at pt's upcoming appointment should pt have continued pain.    Ricard Dillon PT, DPT 09/20/2020, 11:37 AM  [place] MAIN [facility] SERVICES [address] [place], [place], [address] Phone: [phone]   Fax:  [phone]

## 2020-09-23 ENCOUNTER — Ambulatory Visit: Payer: Medicare PPO

## 2020-09-23 ENCOUNTER — Other Ambulatory Visit: Payer: Self-pay

## 2020-09-23 DIAGNOSIS — R2681 Unsteadiness on feet: Secondary | ICD-10-CM

## 2020-09-23 DIAGNOSIS — R2689 Other abnormalities of gait and mobility: Secondary | ICD-10-CM

## 2020-09-23 DIAGNOSIS — M6281 Muscle weakness (generalized): Secondary | ICD-10-CM

## 2020-09-23 NOTE — Therapy (Signed)
[place] MAIN [facility] SERVICES [address] [place], [place], [address] Phone: [phone]   Fax:  [phone]  Physical Therapy Treatment  Patient Details  Name: Kiara [name] MRN: [id number] Date of Birth: [birth year] Referring Provider (PT): Emily Filbert MD (PCP)   Encounter Date: 09/23/2020   PT End of Session - 09/23/20 1436     Visit Number 6    Number of Visits 16    Date for PT Re-Evaluation 10/04/20    Authorization Type [employer] Choice PPO    Authorization Time Period 09/06/20-11/01/20    PT Start Time 0849    PT Stop Time 0930    PT Time Calculation (min) 41 min    Equipment Utilized During Treatment Gait belt    Activity Tolerance Patient tolerated treatment well;No increased pain    Behavior During Therapy WFL for tasks assessed/performed             Past Medical History:  Diagnosis Date   Arthritis    osteoarthritis -right knee.   C. difficile colitis    past,no recent issues   Colitis    Diverticulitis    Fall    "tripped"-10 days ago-scrapped right knee and lateral right brow area-" healing"   GERD (gastroesophageal reflux disease)    occ.   Seasonal allergies     Past Surgical History:  Procedure Laterality Date   APPENDECTOMY     CATARACT EXTRACTION, BILATERAL     EYE SURGERY     ? right eye retina repair.   HEMORRHOID SURGERY     KNEE ARTHROSCOPY Right 11/26/2014   Procedure: ARTHROSCOPY KNEE, Tear medical horn and anterior horn. Lateral mesicus tear, grade 3 medial;  Surgeon: Dereck Leep, MD;  Location: [facility] ORS;  Service: Orthopedics;  Laterality: Right;   MOLE REMOVAL     benign   TONSILLECTOMY     TOTAL KNEE ARTHROPLASTY Right 02/12/2016   Procedure: RIGHT TOTAL KNEE ARTHROPLASTY;  Surgeon: Gaynelle Arabian, MD;  Location: [facility];  Service: Orthopedics;  Laterality: Right;    There were no vitals filed for this visit.   Subjective Assessment - 09/23/20 0849     Subjective Pt reports  no pain today. She says she still gets an occassional twinge in LLE but nothing bad. She said it has happened in the past when she has stepped out of the car, mainly weightbearing through her left leg.    Pertinent History Kiara [name] is an 89yoF who comes to [place] referred by PCP for imbalance in walking. 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 Pt also had an acute onset transient episode of AMB 1 month prior wherein she could only AMB in a circle, but not in a straight line. This occurred while out walking a dog with friend, ultimately sustained a fall onto Rt hand and Rt hip, no injury sustained. Pt goes to her beach house each month and recently has felt less safe AMB on soft sand, also no longer attempts to access water alone due to imbalance.    How long can you sit comfortably? not related to this episode    How long can you stand comfortably? not related to this episode    How long can you walk comfortably? not related to this episode    Patient Stated Goals Feel more confident in walking at the beach and be able to safely access  the ocean.    Currently in Pain? No/denies            TREATMENT  Therex-   Standing gastrocsol stretches 2x30 sec each LE   NEURO- CGA provided for the following exercises this session--  Feet parallel, one foot on ariex one on 6" step - 2x30 sec each LE; intermittent UE support  Airex Pad: NBOS EC - 2x60 sec, continued LOB multiple times that requires UE support and CGA to regain balance;  NBOS EO horizontal head turns - 2x10 B directions   NBOS EO vertical head turns - 2x10 B directions   Standing cone taps forward and laterally 15x each LE for each; intermittent UE support  Tandem walking 8x, pt takes small, corrective steps.   Backward NBOS walking - 4x, no LOB  Walking with vertical, horizontal head turns 3x for each. Decreased postural stability  noted.   At support surface: High knee marches, emphasis on SLB with dynamic task - 8x. Improvement with reps.    Tandem stance - 2x30 sec each LE   Pt educated throughout session about proper posture and technique with exercises. Improved exercise technique, movement at target joints, use of target muscles after min to mod verbal, visual, tactile cues.   PT Education - 09/23/20 1436     Education Details exercise technique, body mechanics    Person(s) Educated Patient    Methods Explanation    Comprehension Returned demonstration;Verbalized understanding              PT Short Term Goals - 09/09/20 1032       PT SHORT TERM GOAL #1   Title After 4 weeks pt will demonstrate SLS balance >30sec bilat.    Baseline Eval: ~3 sec bilat    Time 4    Period Weeks    Status New    Target Date 10/04/20      PT SHORT TERM GOAL #2   Title Pt to demonstrate backwards walking gait speed >0.45m/s to reveal improved sagittal plane motor control in bilat ankles.    Baseline 6/13: 0.67 m/s    Time 4    Period Weeks    Status New    Target Date 10/04/20      PT SHORT TERM GOAL #3   Title Pt to demonstrate ability to AMB in thick grass at modI level >288ft with LRAD and without LOB.    Baseline difficulty with uneven surfaces, avoids at eval:    Time 4    Period Weeks    Status New    Target Date 10/04/20               PT Long Term Goals - 09/09/20 1033       PT LONG TERM GOAL #1   Title Pt will score 81 or greater on FOTO survey to demonstrate a sustained level of activity tolerance.    Time 8    Period Weeks    Status New    Target Date 11/01/20      PT LONG TERM GOAL #2   Title Pt will demonstrate SLS balance >45 seconds shod to shoe improved ankle/trunk motorcontrol.    Baseline ~3 sec at eval    Time 8    Period Weeks    Status New    Target Date 11/01/20      PT LONG TERM GOAL #3   Title Pt to demonstrate backward walking gait speed ~ 0.64m/s or greater to  show improved  facility in ankle motor control in gait.    Baseline 6/13: 0.67 m/s    Time 8    Period Weeks    Status New    Target Date 11/01/20      PT LONG TERM GOAL #4   Title Pt will demonstrate safe and appropriate use of 2 trekking poles for AMB on unlevel, uneven, and soft surfaces to show independent and safe ability to access beach area ad lib.    Baseline low confidence, concerns of safety at eval    Time 8    Period Weeks    Status New    Target Date 11/01/20              Plan - 09/23/20 1542     Clinical Impression Statement Pt tolerates treatment well with no pain with all exercises, which is a significant improvement from LLE sx pt reported after previous session. PT did instuct pt on safe use of ice for 15-20 min on LLE after PT session. Pt verbalized understanding. Pt most challenged today with head turns and EC on compliant surface, indicating pt having some difficulty using vestibular cues to maintain balance. The pt will benefit from further skilled PT to improve balance in order to decrease fall risk.    Personal Factors and Comorbidities Past/Current Experience;Time since onset of injury/illness/exacerbation    Examination-Activity Limitations Locomotion Level;Stairs;Squat    Examination-Participation Restrictions Cleaning;Community Activity;Yard Work;Other   going to [employer] Making Stable/Uncomplicated    Rehab Potential Excellent    PT Frequency 2x / week    PT Duration 8 weeks    PT Treatment/Interventions Balance training;Functional mobility training;Therapeutic activities;Therapeutic exercise;Neuromuscular re-education;Patient/family education;Gait training;Stair training;Ultrasound;Canalith Repostioning;Electrical Stimulation;DME Instruction    PT Next Visit Plan balance on incline, decline, and banked surfaces; assess AMB in grass, ankle inv/ev measurement, balance on compliant surfaces, dynamic balance    PT Home Exercise Plan  SLS 10x5secH bilat, standing all-toes dorsiflexion 15x3secH;   ?Access Code: [email or web address]    Consulted and Agree with Plan of Care Patient             Patient will benefit from skilled therapeutic intervention in order to improve the following deficits and impairments:  Decreased strength, Difficulty walking, Decreased balance, Decreased coordination, Decreased activity tolerance, Decreased knowledge of use of DME, Postural dysfunction, Improper body mechanics  Visit Diagnosis: Unsteadiness on feet  Muscle weakness (generalized)  Other abnormalities of gait and mobility     Problem List Patient Active Problem List   Diagnosis Date Noted   Cataract cortical, senile 08/03/2017   Chronic colitis 08/03/2017   Medicare annual wellness visit, initial 12/15/2016   Benign essential hypertension 11/04/2016   OA (osteoarthritis) of knee 02/12/2016   Hyperlipidemia, mixed 10/24/2015   Clostridium difficile colitis 01/02/2014   Right bundle branch block 10/18/2013   Ricard Dillon PT, DPT 09/23/2020, 3:48 PM  [place] MAIN [facility] SERVICES [place], [place], [address] Phone: [phone]   Fax:  [phone]  Name: Kiara [name] MRN: [id number] Date of Birth: [birth year]

## 2020-09-25 ENCOUNTER — Ambulatory Visit: Payer: Medicare PPO

## 2020-09-27 ENCOUNTER — Ambulatory Visit: Payer: Medicare PPO

## 2020-10-01 ENCOUNTER — Other Ambulatory Visit: Payer: Self-pay

## 2020-10-01 ENCOUNTER — Ambulatory Visit: Payer: Medicare PPO | Attending: Neurology

## 2020-10-01 DIAGNOSIS — R2681 Unsteadiness on feet: Secondary | ICD-10-CM | POA: Insufficient documentation

## 2020-10-01 DIAGNOSIS — M6281 Muscle weakness (generalized): Secondary | ICD-10-CM | POA: Insufficient documentation

## 2020-10-01 DIAGNOSIS — R278 Other lack of coordination: Secondary | ICD-10-CM | POA: Diagnosis present

## 2020-10-01 DIAGNOSIS — R2689 Other abnormalities of gait and mobility: Secondary | ICD-10-CM | POA: Diagnosis present

## 2020-10-01 NOTE — Therapy (Signed)
[place] MAIN [facility] SERVICES [address] [place], [place], [address] Phone: [phone]   Fax:  [phone]  Physical Therapy Treatment  Patient Details  Name: Kiara [name] MRN: [id number] Date of Birth: [birth year] Referring Provider (PT): Emily Filbert MD (PCP)   Encounter Date: 10/01/2020   PT End of Session - 10/01/20 1409     Visit Number 7    Number of Visits 16    Date for PT Re-Evaluation 10/04/20    Authorization Type [employer] PPO    Authorization Time Period 09/06/20-11/01/20    PT Start Time 1149    PT Stop Time 1230    PT Time Calculation (min) 41 min    Equipment Utilized During Treatment Gait belt    Activity Tolerance Patient tolerated treatment well;No increased pain    Behavior During Therapy WFL for tasks assessed/performed             Past Medical History:  Diagnosis Date   Arthritis    osteoarthritis -right knee.   C. difficile colitis    past,no recent issues   Colitis    Diverticulitis    Fall    "tripped"-10 days ago-scrapped right knee and lateral right brow area-" healing"   GERD (gastroesophageal reflux disease)    occ.   Seasonal allergies     Past Surgical History:  Procedure Laterality Date   APPENDECTOMY     CATARACT EXTRACTION, BILATERAL     EYE SURGERY     ? right eye retina repair.   HEMORRHOID SURGERY     KNEE ARTHROSCOPY Right 11/26/2014   Procedure: ARTHROSCOPY KNEE, Tear medical horn and anterior horn. Lateral mesicus tear, grade 3 medial;  Surgeon: Dereck Leep, MD;  Location: [facility];  Service: Orthopedics;  Laterality: Right;   MOLE REMOVAL     benign   TONSILLECTOMY     TOTAL KNEE ARTHROPLASTY Right 02/12/2016   Procedure: RIGHT TOTAL KNEE ARTHROPLASTY;  Surgeon: Gaynelle Arabian, MD;  Location: [facility];  Service: Orthopedics;  Laterality: Right;    There were no vitals filed for this visit.   Subjective Assessment - 10/01/20 1151     Subjective Pt reports  she was at the beach this past weekend. She says her balance was OK on the sand. She says she did not go in the water d/t worries about her balance. Pt reports no pain currently.    Pertinent History Kiara [name] is an 47yoF who comes to [place] referred by PCP for imbalance in walking. 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 Pt also had an acute onset transient episode of AMB 1 month prior wherein she could only AMB in a circle, but not in a straight line. This occurred while out walking a dog with friend, ultimately sustained a fall onto Rt hand and Rt hip, no injury sustained. Pt goes to her beach house each month and recently has felt less safe AMB on soft sand, also no longer attempts to access water alone due to imbalance.    How long can you sit comfortably? not related to this episode    How long can you stand comfortably? not related to this episode    How long can you walk comfortably? not related to this episode    Patient Stated Goals Feel more confident in walking at the beach and be able to safely access the ocean.  Currently in Pain? No/denies             TREATMENT  Therex-    Standing gastrocsol stretches 2x30 sec each LE    NEURO-  CGA provided for the following exercises this session--   Airex Pad: NBOS EC - 4x30 sec, continued LOB multiple times that requires UE support and CGA from PT to regain balance NBOS EO with vertical, horizontal head turns 2x30 sec for each NBOS EO, semi-tandem, horizontal head turns - 2x30 B with each LE as primary stance leg  NBOS EO semi-tandem vertical head turns - 2x30 B with each LE as primary stance leg Pt uses intermittent UE support throughout  Airex beam - Forward walking - 10x; very challenging for pt. Pt rates difficult. Frequent use of UE support Side stepping - 8x; pt rates easy, still uses intermittent UE support, but not as  frequently as with forward walking Side stepping with horizontal head turns 8x length of beam; pt rates very challenging. Frequent use of UE support.  Modified SLB on soccer ball -  side-to-side, forward/backward, CW/CC 10x for each on each LE. Pt uses intermittent UE support. Up to min a from PT for pt to regain balance   SLB - 2x30 sec each LE. Significant improvement. Pt almost achieves a full 30 sec each LE.   Rocker board forward/backward - 15x. Pt uses intermittent UE support. More challenged with PF.     Pt educated throughout session about proper posture and technique with exercises. Improved exercise technique, movement at target joints, use of target muscles after min to mod verbal, visual, tactile cues.      PT Short Term Goals - 09/09/20 1032       PT SHORT TERM GOAL #1   Title After 4 weeks pt will demonstrate SLS balance >30sec bilat.    Baseline Eval: ~3 sec bilat    Time 4    Period Weeks    Status New    Target Date 10/04/20      PT SHORT TERM GOAL #2   Title Pt to demonstrate backwards walking gait speed >0.81m/s to reveal improved sagittal plane motor control in bilat ankles.    Baseline 6/13: 0.67 m/s    Time 4    Period Weeks    Status New    Target Date 10/04/20      PT SHORT TERM GOAL #3   Title Pt to demonstrate ability to AMB in thick grass at modI level >261ft with LRAD and without LOB.    Baseline difficulty with uneven surfaces, avoids at eval:    Time 4    Period Weeks    Status New    Target Date 10/04/20               PT Long Term Goals - 09/09/20 1033       PT LONG TERM GOAL #1   Title Pt will score 81 or greater on FOTO survey to demonstrate a sustained level of activity tolerance.    Time 8    Period Weeks    Status New    Target Date 11/01/20      PT LONG TERM GOAL #2   Title Pt will demonstrate SLS balance >45 seconds shod to shoe improved ankle/trunk motorcontrol.    Baseline ~3 sec at eval    Time 8    Period Weeks     Status New    Target Date 11/01/20      PT  LONG TERM GOAL #3   Title Pt to demonstrate backward walking gait speed ~ 0.69m/s or greater to show improved facility in ankle motor control in gait.    Baseline 6/13: 0.67 m/s    Time 8    Period Weeks    Status New    Target Date 11/01/20      PT LONG TERM GOAL #4   Title Pt will demonstrate safe and appropriate use of 2 trekking poles for AMB on unlevel, uneven, and soft surfaces to show independent and safe ability to access beach area ad lib.    Baseline low confidence, concerns of safety at eval    Time 8    Period Weeks    Status New    Target Date 11/01/20                   Plan - 10/01/20 1629     Clinical Impression Statement Pt shows progress by maintaining SLB for almost 30 sec on BLEs, indicating improved static balance and carryover between sessions. While pt shows improvement, she is still challenged with exercises on compliant surfaces that require increased use of vestibular cues. The pt will continue to benefit from further skliled PT to improve balance in order to decrease fall risk.    Personal Factors and Comorbidities Past/Current Experience;Time since onset of injury/illness/exacerbation    Examination-Activity Limitations Locomotion Level;Stairs;Squat    Examination-Participation Restrictions Cleaning;Community Activity;Yard Work;Other   going to [employer] Making Stable/Uncomplicated    Rehab Potential Excellent    PT Frequency 2x / week    PT Duration 8 weeks    PT Treatment/Interventions Balance training;Functional mobility training;Therapeutic activities;Therapeutic exercise;Neuromuscular re-education;Patient/family education;Gait training;Stair training;Ultrasound;Canalith Repostioning;Electrical Stimulation;DME Instruction    PT Next Visit Plan balance on incline, decline, and banked surfaces; assess AMB in grass, ankle inv/ev measurement, balance on compliant surfaces,  dynamic balance, balance activities that require increased use of vestibular cues    PT Home Exercise Plan SLS 10x5secH bilat, standing all-toes dorsiflexion 15x3secH;   ?Access Code: [email or web address]; no updates on this date    Consulted and Agree with Plan of Care Patient             Patient will benefit from skilled therapeutic intervention in order to improve the following deficits and impairments:  Decreased strength, Difficulty walking, Decreased balance, Decreased coordination, Decreased activity tolerance, Decreased knowledge of use of DME, Postural dysfunction, Improper body mechanics  Visit Diagnosis: Unsteadiness on feet  Muscle weakness (generalized)     Problem List Patient Active Problem List   Diagnosis Date Noted   Cataract cortical, senile 08/03/2017   Chronic colitis 08/03/2017   Medicare annual wellness visit, initial 12/15/2016   Benign essential hypertension 11/04/2016   OA (osteoarthritis) of knee 02/12/2016   Hyperlipidemia, mixed 10/24/2015   Clostridium difficile colitis 01/02/2014   Right bundle branch block 10/18/2013   Ricard Dillon PT, DPT 10/01/2020, 4:36 PM  [place] [facility] SERVICES [place], [place], [address] Phone: [phone]   Fax:  [phone]  Name: Kiara [name] MRN: [id number] Date of Birth: 12/01/1934

## 2020-10-02 ENCOUNTER — Ambulatory Visit: Payer: Medicare PPO

## 2020-10-03 ENCOUNTER — Ambulatory Visit: Payer: Medicare PPO

## 2020-10-04 ENCOUNTER — Ambulatory Visit: Payer: Medicare PPO

## 2020-10-07 ENCOUNTER — Ambulatory Visit: Payer: Medicare PPO

## 2020-10-09 ENCOUNTER — Ambulatory Visit: Payer: Medicare PPO

## 2020-10-11 ENCOUNTER — Ambulatory Visit: Payer: Medicare PPO

## 2020-10-14 ENCOUNTER — Other Ambulatory Visit: Payer: Self-pay

## 2020-10-14 ENCOUNTER — Ambulatory Visit: Payer: Medicare PPO

## 2020-10-14 DIAGNOSIS — R2689 Other abnormalities of gait and mobility: Secondary | ICD-10-CM

## 2020-10-14 DIAGNOSIS — R2681 Unsteadiness on feet: Secondary | ICD-10-CM

## 2020-10-14 NOTE — Therapy (Signed)
[place] MAIN [facility] SERVICES [address] [place], [place], [address] Phone: [phone]   Fax:  [phone]  Physical Therapy Treatment/RECERT  Patient Details  Name: Kiara [name] MRN: [id number] Date of Birth: September 07, 1934 Referring Provider (PT): Emily Filbert MD (PCP)   Encounter Date: 10/14/2020   PT End of Session - 10/14/20 0940     Visit Number 8    Number of Visits 16    Date for PT Re-Evaluation 10/04/20    Authorization Type [employer] Choice PPO    Authorization Time Period 09/06/20-11/01/20    PT Start Time 0850    PT Stop Time 0930    PT Time Calculation (min) 40 min    Equipment Utilized During Treatment Gait belt    Activity Tolerance Patient tolerated treatment well;No increased pain    Behavior During Therapy WFL for tasks assessed/performed             Past Medical History:  Diagnosis Date   Arthritis    osteoarthritis -right knee.   C. difficile colitis    past,no recent issues   Colitis    Diverticulitis    Fall    "tripped"-10 days ago-scrapped right knee and lateral right brow area-" healing"   GERD (gastroesophageal reflux disease)    occ.   Seasonal allergies     Past Surgical History:  Procedure Laterality Date   APPENDECTOMY     CATARACT EXTRACTION, BILATERAL     EYE SURGERY     ? right eye retina repair.   HEMORRHOID SURGERY     KNEE ARTHROSCOPY Right 11/26/2014   Procedure: ARTHROSCOPY KNEE, Tear medical horn and anterior horn. Lateral mesicus tear, grade 3 medial;  Surgeon: Dereck Leep, MD;  Location: [facility];  Service: Orthopedics;  Laterality: Right;   MOLE REMOVAL     benign   TONSILLECTOMY     TOTAL KNEE ARTHROPLASTY Right 02/12/2016   Procedure: RIGHT TOTAL KNEE ARTHROPLASTY;  Surgeon: Gaynelle Arabian, MD;  Location: [facility];  Service: Orthopedics;  Laterality: Right;    There were no vitals filed for this visit.   Subjective Assessment - 10/14/20 0852     Subjective Pt  returns from absence after family vacation. Pt reports she is very stressed due to her daughter's upcoming ablation procedure. Pt reports continued knee pain B. She reports at rest pain is 0/10.    Pertinent History Kiara [name] is an 67yoF who comes to [place] referred by PCP for imbalance in walking. 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 Pt also had an acute onset transient episode of AMB 1 month prior wherein she could only AMB in a circle, but not in a straight line. This occurred while out walking a dog with friend, ultimately sustained a fall onto Rt hand and Rt hip, no injury sustained. Pt goes to her beach house each month and recently has felt less safe AMB on soft sand, also no longer attempts to access water alone due to imbalance.    How long can you sit comfortably? not related to this episode    How long can you stand comfortably? not related to this episode    How long can you walk comfortably? not related to this episode    Patient Stated Goals Feel more confident in walking at the beach and be able to safely access the ocean.    Currently in  Pain? No/denies              INTERVENTIONS  Retesting of goals for recert- see goal section for details.   PT instructs pt in the following for HEP, all performed at support surface with CGA: SLS - 2x30 sec BLEs Tandem stance - 2x30 sec BLEs Slow marches - 2x20 BLEs  PT instructs pt in safety with HEP (see HEP section for details), reinforces importance of performing HEP. Pt verbalizes understanding.   Standing NBOS - 30 sec, no decrease in balance, rates easy Standing NBOS with head-turns (vertical and horizontal) - 30 sec each. No LOB, rates easy  PT instructs pt in indications of goal retesting and POC. Pt verbalizes understanding.    PT Education - 10/14/20 [id number]     Education Details exercise technique, body mechanics    Person(s)  Educated Patient    Methods Demonstration;Explanation    Comprehension Verbalized understanding;Returned demonstration              PT Short Term Goals - 10/14/20 0944       PT SHORT TERM GOAL #1   Title After 4 weeks pt will demonstrate SLS balance >30sec bilat.    Baseline Eval: ~3 sec bilat; 7/19: 48 L, 24 sec R    Time 4    Period Weeks    Status Partially Met    Target Date 11/11/20      PT SHORT TERM GOAL #2   Title Pt to demonstrate backwards walking gait speed >0.52ms to reveal improved sagittal plane motor control in bilat ankles.    Baseline 6/13: 0.67 m/s, 7/18: 0.7 m/s    Time 4    Period Weeks    Status Partially Met    Target Date 11/11/20      PT SHORT TERM GOAL #3   Title Pt to demonstrate ability to AMB in thick grass at modI level >207fwith LRAD and without LOB.    Baseline difficulty with uneven surfaces, avoids at eval: 7/18: deferred to next appointment    Time 4    Period Weeks    Status On-going    Target Date 11/11/20               PT Long Term Goals - 10/14/20 0856       PT LONG TERM GOAL #1   Title Pt will score 81 or greater on FOTO survey to demonstrate a sustained level of activity tolerance.    Baseline 7/18: unable to assess this session d/t server being down    Time 8    Period Weeks    Status New    Target Date 12/09/20      PT LONG TERM GOAL #2   Title Pt will demonstrate SLS balance >45 seconds shod to shoe improved ankle/trunk motorcontrol.    Baseline ~3 sec at eval; 7/18: L 48 sec, R: 24 sec    Time 8    Period Weeks    Status Partially Met    Target Date 12/09/20      PT LONG TERM GOAL #3   Title Pt to demonstrate backward walking gait speed ~ 0.6m16mor greater to show improved facility in ankle motor control in gait.    Baseline 6/13: 0.67 m/s; 7/18 0.7 m/s    Time 8    Period Weeks    Status Partially Met    Target Date 12/09/20      PT LONG TERM GOAL #4   Title  Pt will demonstrate safe and appropriate  AMB on unlevel, uneven, and soft surfaces to show independent and safe ability to access beach area ad lib.    Baseline low confidence, concerns of safety at eval; 7/18: pt felt fairly confident amb. ad lib over uneven and soft surface. Reports overal improvement with ability to ambulate on beach. Only shows two instances of decreased stability and step strategy to regain balance.    Time 8    Period Weeks    Status Partially Met    Target Date 12/09/20                   Plan - 10/14/20 0941     Clinical Impression Statement Goals retested for recert. Although pt has not been to PT in a while d/t famliy matters, she has maintained gains and even shown progress from previous assessment. Pt has improved her SLB as well as ability to navigate uneven and soft surfaces without losing balance, partially meeting multiple goals. The pt will continue to benefit from further skilled PT to continue to improve balance in order to decrease fall risk.    Personal Factors and Comorbidities Past/Current Experience;Time since onset of injury/illness/exacerbation    Examination-Activity Limitations Locomotion Level;Stairs;Squat    Examination-Participation Restrictions Cleaning;Community Activity;Yard Work;Other   going to [employer] Making Stable/Uncomplicated    Rehab Potential Excellent    PT Frequency 2x / week    PT Duration 8 weeks    PT Treatment/Interventions Balance training;Functional mobility training;Therapeutic activities;Therapeutic exercise;Neuromuscular re-education;Patient/family education;Gait training;Stair training;Ultrasound;Canalith Repostioning;Electrical Stimulation;DME Instruction    PT Next Visit Plan balance on incline, decline, and banked surfaces; assess AMB in grass, ankle inv/ev measurement, balance on compliant surfaces, dynamic balance, balance activities that require increased use of vestibular cues    PT Home Exercise Plan SLS 10x5secH bilat,  standing all-toes dorsiflexion 15x3secH;   ?Access Code: [email or web address]; no updates on this date; 7/18: reissued handout of SLS, tandem stance and slow marches for HEP with instruction to perform at support surface with chair behind her and to not perform if feeling increasingly unsteady. Pt verbalized understanding.    Consulted and Agree with Plan of Care Patient             Patient will benefit from skilled therapeutic intervention in order to improve the following deficits and impairments:  Decreased strength, Difficulty walking, Decreased balance, Decreased coordination, Decreased activity tolerance, Decreased knowledge of use of DME, Postural dysfunction, Improper body mechanics  Visit Diagnosis: Unsteadiness on feet  Other abnormalities of gait and mobility     Problem List Patient Active Problem List   Diagnosis Date Noted   Cataract cortical, senile 08/03/2017   Chronic colitis 08/03/2017   Medicare annual wellness visit, initial 12/15/2016   Benign essential hypertension 11/04/2016   OA (osteoarthritis) of knee 02/12/2016   Hyperlipidemia, mixed 10/24/2015   Clostridium difficile colitis 01/02/2014   Right bundle branch block 10/18/2013   Ricard Dillon PT, DPT  10/14/2020, 9:50 AM  [place] [address] [place], [place], [address] Phone: [phone]   Fax:  [phone]  Name: Kiara [name] MRN: [id number] Date of Birth: [birth year]

## 2020-10-16 ENCOUNTER — Other Ambulatory Visit: Payer: Self-pay

## 2020-10-16 ENCOUNTER — Ambulatory Visit: Payer: Medicare PPO

## 2020-10-16 DIAGNOSIS — R278 Other lack of coordination: Secondary | ICD-10-CM

## 2020-10-16 DIAGNOSIS — R2681 Unsteadiness on feet: Secondary | ICD-10-CM | POA: Diagnosis not present

## 2020-10-16 NOTE — Therapy (Signed)
[place] MAIN [facility] SERVICES [address] [place], [place], [address] Phone: [phone]   Fax:  [phone]  Physical Therapy Treatment  Patient Details  Name: Kiara [name] MRN: [id number] Date of Birth: [birth year] Referring Provider (PT): Emily Filbert MD (PCP)   Encounter Date: 10/16/2020   PT End of Session - 10/16/20 0848     Visit Number 9    Number of Visits 32    Date for PT Re-Evaluation 12/09/20    Authorization Type [employer] PPO    Authorization Time Period 09/06/20-11/01/20    PT Start Time 0803    PT Stop Time 0843    PT Time Calculation (min) 40 min    Equipment Utilized During Treatment Gait belt    Activity Tolerance Patient tolerated treatment well;No increased pain    Behavior During Therapy WFL for tasks assessed/performed             Past Medical History:  Diagnosis Date   Arthritis    osteoarthritis -right knee.   C. difficile colitis    past,no recent issues   Colitis    Diverticulitis    Fall    "tripped"-10 days ago-scrapped right knee and lateral right brow area-" healing"   GERD (gastroesophageal reflux disease)    occ.   Seasonal allergies     Past Surgical History:  Procedure Laterality Date   APPENDECTOMY     CATARACT EXTRACTION, BILATERAL     EYE SURGERY     ? right eye retina repair.   HEMORRHOID SURGERY     KNEE ARTHROSCOPY Right 11/26/2014   Procedure: ARTHROSCOPY KNEE, Tear medical horn and anterior horn. Lateral mesicus tear, grade 3 medial;  Surgeon: Dereck Leep, MD;  Location: [facility] ORS;  Service: Orthopedics;  Laterality: Right;   MOLE REMOVAL     benign   TONSILLECTOMY     TOTAL KNEE ARTHROPLASTY Right 02/12/2016   Procedure: RIGHT TOTAL KNEE ARTHROPLASTY;  Surgeon: Gaynelle Arabian, MD;  Location: [facility];  Service: Orthopedics;  Laterality: Right;    There were no vitals filed for this visit.   Subjective Assessment - 10/16/20 0806     Subjective Pt reports  soreness from working in the yard. Pt states "it's from overdoing it."    Pertinent History Kiara [name] is an 83yoF who comes to [place] referred by PCP for imbalance in walking. 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 Pt also had an acute onset transient episode of AMB 1 month prior wherein she could only AMB in a circle, but not in a straight line. This occurred while out walking a dog with friend, ultimately sustained a fall onto Rt hand and Rt hip, no injury sustained. Pt goes to her beach house each month and recently has felt less safe AMB on soft sand, also no longer attempts to access water alone due to imbalance.    How long can you sit comfortably? not related to this episode    How long can you stand comfortably? not related to this episode    How long can you walk comfortably? not related to this episode    Patient Stated Goals Feel more confident in walking at the beach and be able to safely access the ocean.    Currently in Pain? No/denies             Therex-  NEURO-   CGA- min a provided for the following exercises this session--   Airex Pad: NBOS EO with vertical, horizontal head turns 1x30 sec for each NBOS EO semi-tandem vertical head turns - 1x30 B with each LE as primary stance leg NBOS EC - 4x30 sec, continued LOB multiple times that requires UE support and min assist from PT to regain balance. Pt rates difficult WBOS EC- 2x30 sec, intermittent UE support, much more stable   SLB - 3x30 sec each LE. Greater difficulty on RLE. Rates medium  Tandem walking - 4x over 10 meters; rates very difficult, multiple step-strategies to regain balance. Backwards amb.  - 2x over 10 meters  Tandem stance 30 sec BLEs Tandem stance with vertical, horizontal head turns, each LE - 60 sec for each  large and small rocker board forward/backward, side-to-side - x multiple reps each,  intermittent UE support       PT Short Term Goals - 10/14/20 0944       PT SHORT TERM GOAL #1   Title After 4 weeks pt will demonstrate SLS balance >30sec bilat.    Baseline Eval: ~3 sec bilat; 7/19: 48 L, 24 sec R    Time 4    Period Weeks    Status Partially Met    Target Date 11/11/20      PT SHORT TERM GOAL #2   Title Pt to demonstrate backwards walking gait speed >0.60ms to reveal improved sagittal plane motor control in bilat ankles.    Baseline 6/13: 0.67 m/s, 7/18: 0.7 m/s    Time 4    Period Weeks    Status Partially Met    Target Date 11/11/20      PT SHORT TERM GOAL #3   Title Pt to demonstrate ability to AMB in thick grass at modI level >2083fwith LRAD and without LOB.    Baseline difficulty with uneven surfaces, avoids at eval: 7/18: deferred to next appointment    Time 4    Period Weeks    Status On-going    Target Date 11/11/20               PT Long Term Goals - 10/14/20 0856       PT LONG TERM GOAL #1   Title Pt will score 81 or greater on FOTO survey to demonstrate a sustained level of activity tolerance.    Baseline 7/18: unable to assess this session d/t server being down    Time 8    Period Weeks    Status New    Target Date 12/09/20      PT LONG TERM GOAL #2   Title Pt will demonstrate SLS balance >45 seconds shod to shoe improved ankle/trunk motorcontrol.    Baseline ~3 sec at eval; 7/18: L 48 sec, R: 24 sec    Time 8    Period Weeks    Status Partially Met    Target Date 12/09/20      PT LONG TERM GOAL #3   Title Pt to demonstrate backward walking gait speed ~ 0.36m536mor greater to show improved facility in ankle motor control in gait.    Baseline 6/13: 0.67 m/s; 7/18 0.7 m/s    Time 8    Period Weeks    Status Partially Met    Target Date 12/09/20      PT LONG TERM GOAL #4   Title Pt will demonstrate safe and appropriate AMB on unlevel, uneven, and soft surfaces to  show independent and safe ability to access beach area ad  lib.    Baseline low confidence, concerns of safety at eval; 7/18: pt felt fairly confident amb. ad lib over uneven and soft surface. Reports overal improvement with ability to ambulate on beach. Only shows two instances of decreased stability and step strategy to regain balance.    Time 8    Period Weeks    Status Partially Met    Target Date 12/09/20                   Plan - 10/16/20 0846     Clinical Impression Statement Pt shows improvement with backward amb. with no LOB or decrease in postural stability, indicating carryover between sessions. Although pt shows progress she is most challenged with all exercises that rely most on vestibular cues. The pt will benefit from further skilled PT to continue to improve static and dynamic balance to decrease fall risk.    Personal Factors and Comorbidities Past/Current Experience;Time since onset of injury/illness/exacerbation    Examination-Activity Limitations Locomotion Level;Stairs;Squat    Examination-Participation Restrictions Cleaning;Community Activity;Yard Work;Other   going to [employer] Making Stable/Uncomplicated    Rehab Potential Excellent    PT Frequency 2x / week    PT Duration 8 weeks    PT Treatment/Interventions Balance training;Functional mobility training;Therapeutic activities;Therapeutic exercise;Neuromuscular re-education;Patient/family education;Gait training;Stair training;Ultrasound;Canalith Repostioning;Electrical Stimulation;DME Instruction    PT Next Visit Plan balance on incline, decline, and banked surfaces; assess AMB in grass, ankle inv/ev measurement, balance on compliant surfaces, dynamic balance, balance activities that require increased use of vestibular cues    PT Home Exercise Plan SLS 10x5secH bilat, standing all-toes dorsiflexion 15x3secH;   ?Access Code: [email or web address]; no updates on this date; 7/18: reissued handout of SLS, tandem stance and slow marches for HEP with instruction  to perform at support surface with chair behind her and to not perform if feeling increasingly unsteady. Pt verbalized understanding.    Consulted and Agree with Plan of Care Patient             Patient will benefit from skilled therapeutic intervention in order to improve the following deficits and impairments:  Decreased strength, Difficulty walking, Decreased balance, Decreased coordination, Decreased activity tolerance, Decreased knowledge of use of DME, Postural dysfunction, Improper body mechanics  Visit Diagnosis: Unsteadiness on feet  Other lack of coordination     Problem List Patient Active Problem List   Diagnosis Date Noted   Cataract cortical, senile 08/03/2017   Chronic colitis 08/03/2017   Medicare annual wellness visit, initial 12/15/2016   Benign essential hypertension 11/04/2016   OA (osteoarthritis) of knee 02/12/2016   Hyperlipidemia, mixed 10/24/2015   Clostridium difficile colitis 01/02/2014   Right bundle branch block 10/18/2013   Ricard Dillon PT, DPT 10/16/2020, 8:50 AM  [place], [place], [address] Phone: [phone]   Fax:  [phone]  Name: Kiara [name] MRN: [id number] Date of Birth: 03/01/1935

## 2020-10-21 ENCOUNTER — Ambulatory Visit: Payer: Medicare PPO

## 2020-10-21 ENCOUNTER — Other Ambulatory Visit: Payer: Self-pay

## 2020-10-21 DIAGNOSIS — R2689 Other abnormalities of gait and mobility: Secondary | ICD-10-CM

## 2020-10-21 DIAGNOSIS — R2681 Unsteadiness on feet: Secondary | ICD-10-CM

## 2020-10-21 DIAGNOSIS — M6281 Muscle weakness (generalized): Secondary | ICD-10-CM

## 2020-10-21 NOTE — Therapy (Signed)
[place] MAIN [facility] SERVICES [address] [place], [place], [address] Phone: [phone]   Fax:  [phone]  Physical Therapy Treatment/Physical Therapy Progress Note   Dates of reporting period  09/06/2020   to   10/21/2020   Patient Details  Name: Kiara [name] MRN: [id number] Date of Birth: 1934/08/08 Referring Provider (PT): Emily Filbert MD (PCP)   Encounter Date: 10/21/2020   PT End of Session - 10/21/20 1500     Visit Number 10    Number of Visits 32    Date for PT Re-Evaluation 12/09/20    Authorization Type [employer] Choice PPO    Authorization Time Period 09/06/20-11/01/20    PT Start Time 1308    PT Stop Time 1347    PT Time Calculation (min) 39 min    Equipment Utilized During Treatment Gait belt    Activity Tolerance Patient tolerated treatment well;No increased pain    Behavior During Therapy WFL for tasks assessed/performed             Past Medical History:  Diagnosis Date   Arthritis    osteoarthritis -right knee.   C. difficile colitis    past,no recent issues   Colitis    Diverticulitis    Fall    "tripped"-10 days ago-scrapped right knee and lateral right brow area-" healing"   GERD (gastroesophageal reflux disease)    occ.   Seasonal allergies     Past Surgical History:  Procedure Laterality Date   APPENDECTOMY     CATARACT EXTRACTION, BILATERAL     EYE SURGERY     ? right eye retina repair.   HEMORRHOID SURGERY     KNEE ARTHROSCOPY Right 11/26/2014   Procedure: ARTHROSCOPY KNEE, Tear medical horn and anterior horn. Lateral mesicus tear, grade 3 medial;  Surgeon: Dereck Leep, MD;  Location: [facility] ORS;  Service: Orthopedics;  Laterality: Right;   MOLE REMOVAL     benign   TONSILLECTOMY     TOTAL KNEE ARTHROPLASTY Right 02/12/2016   Procedure: RIGHT TOTAL KNEE ARTHROPLASTY;  Surgeon: Gaynelle Arabian, MD;  Location: [facility];  Service: Orthopedics;  Laterality: Right;    There were no vitals  filed for this visit.   Subjective Assessment - 10/21/20 1459     Subjective Pt reports some stress d/t daughter's health situation. She feels her balance has been a little off some days. No other major changest reported since previous session.    Pertinent History Kiara [name] is an 34yoF who comes to [place] referred by PCP for imbalance in walking. 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 Pt also had an acute onset transient episode of AMB 1 month prior wherein she could only AMB in a circle, but not in a straight line. This occurred while out walking a dog with friend, ultimately sustained a fall onto Rt hand and Rt hip, no injury sustained. Pt goes to her beach house each month and recently has felt less safe AMB on soft sand, also no longer attempts to access water alone due to imbalance.    How long can you sit comfortably? not related to this episode    How long can you stand comfortably? not related to this episode    How long can you walk comfortably? not related to this episode    Patient Stated Goals Feel more confident in walking at the beach and  be able to safely access the ocean.    Currently in Pain? No/denies            TREATMENT -   CGA-min a provided for the following exercises this session--  FOTO- 82 (unchanged)  Pt ambulates over thick grass, inclines/declines, curb - no LOB  Firm surface, bending to pick up cones, followed by steps - 2x  Compliant surface (red mat), bending to pick up cones followed by steps - 6x. Reports mild unsteadiness, but no observed LOB  At support bar: On airex, EC, WBOS - 4x30-45 sec, multiple instances of LOB and uses UE support and up to min assist from PT to regain balance  On airex, EC, NBOS, requires at least one finger on bar for cuing to improve balance, otherwise exercise too challenging - 4 x30 sec  SLB 4x30 sec each LE, intermittent  toe-touch or UE support  Triangle cone taps (forward/backward, side-to-side) - x multiple reps with each LE. Requires toe-touch support, decreasing levels of UE support, but must use at least intermittent UE support.   Tandem stance 2x30 sec each LE; greater difficulty with RLE as primary stance leg  Pt educated throughout session about proper posture and technique with exercises. Improved exercise technique, movement at target joints, use of target muscles after min to mod verbal, visual, tactile cues.     PT Education - 10/21/20 1500     Education Details exercise technique, body mechanics with SLB exercises    Person(s) Educated Patient    Methods Explanation;Demonstration;Verbal cues    Comprehension Verbalized understanding;Returned demonstration              PT Short Term Goals - 10/21/20 1503       PT SHORT TERM GOAL #1   Title After 4 weeks pt will demonstrate SLS balance >30sec bilat.    Baseline Eval: ~3 sec bilat; 7/19: 48 L, 24 sec R    Time 4    Period Weeks    Status Partially Met    Target Date 11/11/20      PT SHORT TERM GOAL #2   Title Pt to demonstrate backwards walking gait speed >0.51ms to reveal improved sagittal plane motor control in bilat ankles.    Baseline 6/13: 0.67 m/s, 7/18: 0.7 m/s    Time 4    Period Weeks    Status Partially Met    Target Date 11/11/20      PT SHORT TERM GOAL #3   Title Pt to demonstrate ability to AMB in thick grass at modI level >2041fwith LRAD and without LOB.    Baseline difficulty with uneven surfaces, avoids at eval: 7/18: deferred to next appointment; 7/25: pt ambulates over 200 ft in thick grass w/o LOB and no AD    Time 4    Period Weeks    Status Achieved    Target Date 11/11/20               PT Long Term Goals - 10/21/20 1504       PT LONG TERM GOAL #1   Title Pt will score 81 or greater on FOTO survey to demonstrate a sustained level of activity tolerance.    Baseline 7/18: unable to assess  this session d/t server being down; 7/25: 82    Time 8    Period Weeks    Status Achieved      PT LONG TERM GOAL #2   Title Pt will demonstrate SLS balance >45 seconds  shod to shoe improved ankle/trunk motorcontrol.    Baseline ~3 sec at eval; 7/18: L 48 sec, R: 24 sec    Time 8    Period Weeks    Status Partially Met    Target Date 12/09/20      PT LONG TERM GOAL #3   Title Pt to demonstrate backward walking gait speed ~ 0.36ms or greater to show improved facility in ankle motor control in gait.    Baseline 6/13: 0.67 m/s; 7/18 0.7 m/s    Time 8    Period Weeks    Status Partially Met    Target Date 12/09/20      PT LONG TERM GOAL #4   Title Pt will demonstrate safe and appropriate AMB on unlevel, uneven, and soft surfaces to show independent and safe ability to access beach area ad lib.    Baseline low confidence, concerns of safety at eval; 7/18: pt felt fairly confident amb. ad lib over uneven and soft surface. Reports overal improvement with ability to ambulate on beach. Only shows two instances of decreased stability and step strategy to regain balance; 7/25: pt demos ability to amb. on uneven, unlevel surfaces without LOB, however, reports difficulty in dim lighting    Time 8    Period Weeks    Status Partially Met    Target Date 12/09/20              Plan - 10/21/20 1506     Clinical Impression Statement Goal reassessment completed this session for progress note. Pt has met two goals today: FOTO goal and ambulation goal over thick grass/uneven surfaces (see note from 7/18 for other goal retesting). This indicates improvement in balance, perceived functional mobility and QOL. Although pt demonstrates progress, she is still most challenged with balance tasks where she is unable to rely primarily on visual cues, such as tasks with EC or dimly lit rooms/at night per observation and pt reports. Patient's condition has the potential to improve in response to therapy. Maximum  improvement is yet to be obtained. The anticipated improvement is attainable and reasonable in a generally predictable time. The pt will benefit from furthre skilled PT to continue to improve balance, strength in order to increase ease and safety with all functional mobility.    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    Rehab Potential Excellent    PT Frequency 2x / week    PT Duration 8 weeks    PT Treatment/Interventions Balance training;Functional mobility training;Therapeutic activities;Therapeutic exercise;Neuromuscular re-education;Patient/family education;Gait training;Stair training;Ultrasound;Canalith Repostioning;Electrical Stimulation;DME Instruction    PT Next Visit Plan balance on incline, decline, and banked surfaces; assess AMB in grass, ankle inv/ev measurement, balance on compliant surfaces, dynamic balance, balance activities that require increased use of vestibular cues, tasks with EC or in dimly lit areas    PT Home Exercise Plan SLS 10x5secH bilat, standing all-toes dorsiflexion 15x3secH;   ?Access Code: [email or web address] no updates on this date; 7/18: reissued handout of SLS, tandem stance and slow marches for HEP with instruction to perform at support surface with chair behind her and to not perform if feeling increasingly unsteady. Pt verbalized understanding.    Consulted and Agree with Plan of Care Patient             Patient will benefit from skilled therapeutic  intervention in order to improve the following deficits and impairments:  Decreased strength, Difficulty walking, Decreased balance, Decreased coordination, Decreased activity tolerance, Decreased knowledge of use of DME, Postural  dysfunction, Improper body mechanics  Visit Diagnosis: Unsteadiness on feet  Other abnormalities of gait and mobility  Muscle weakness (generalized)  Problem List Patient Active Problem List   Diagnosis Date Noted   Cataract cortical, senile 08/03/2017   Chronic colitis 08/03/2017   Medicare annual wellness visit, initial 12/15/2016   Benign essential hypertension 11/04/2016   OA (osteoarthritis) of knee 02/12/2016   Hyperlipidemia, mixed 10/24/2015   Clostridium difficile colitis 01/02/2014   Right bundle branch block 10/18/2013   Ricard Dillon PT, DPT 10/21/2020, 3:10 PM  [place] MAIN [facility] SERVICES [place], [place], [address] Phone: [phone]   Fax:  [phone]  Name: Kiara [name] MRN: [id number] Date of Birth: 04/11/1934

## 2020-10-23 ENCOUNTER — Ambulatory Visit: Payer: Medicare PPO

## 2020-10-25 ENCOUNTER — Ambulatory Visit: Payer: Medicare PPO

## 2020-10-25 ENCOUNTER — Other Ambulatory Visit: Payer: Self-pay

## 2020-10-25 DIAGNOSIS — M6281 Muscle weakness (generalized): Secondary | ICD-10-CM

## 2020-10-25 DIAGNOSIS — R2681 Unsteadiness on feet: Secondary | ICD-10-CM | POA: Diagnosis not present

## 2020-10-25 DIAGNOSIS — R2689 Other abnormalities of gait and mobility: Secondary | ICD-10-CM

## 2020-10-25 NOTE — Therapy (Signed)
[place] MAIN [facility] SERVICES [address] [place], [place], [address] Phone: [phone]   Fax:  [phone]  Physical Therapy Treatment  Patient Details  Name: Kiara [name] MRN: [id number] Date of Birth: [birth year] Referring Provider (PT): Emily Filbert MD (PCP)   Encounter Date: 10/25/2020   PT End of Session - 10/25/20 1159     Visit Number 11    Number of Visits 32    Date for PT Re-Evaluation 12/09/20    Authorization Type [employer] PPO    Authorization Time Period 09/06/20-11/01/20    PT Start Time 1006    PT Stop Time 1046    PT Time Calculation (min) 40 min    Equipment Utilized During Treatment Gait belt    Activity Tolerance Patient tolerated treatment well;No increased pain    Behavior During Therapy WFL for tasks assessed/performed             Past Medical History:  Diagnosis Date   Arthritis    osteoarthritis -right knee.   C. difficile colitis    past,no recent issues   Colitis    Diverticulitis    Fall    "tripped"-10 days ago-scrapped right knee and lateral right brow area-" healing"   GERD (gastroesophageal reflux disease)    occ.   Seasonal allergies     Past Surgical History:  Procedure Laterality Date   APPENDECTOMY     CATARACT EXTRACTION, BILATERAL     EYE SURGERY     ? right eye retina repair.   HEMORRHOID SURGERY     KNEE ARTHROSCOPY Right 11/26/2014   Procedure: ARTHROSCOPY KNEE, Tear medical horn and anterior horn. Lateral mesicus tear, grade 3 medial;  Surgeon: Dereck Leep, MD;  Location: [facility] ORS;  Service: Orthopedics;  Laterality: Right;   MOLE REMOVAL     benign   TONSILLECTOMY     TOTAL KNEE ARTHROPLASTY Right 02/12/2016   Procedure: RIGHT TOTAL KNEE ARTHROPLASTY;  Surgeon: Gaynelle Arabian, MD;  Location: [facility];  Service: Orthopedics;  Laterality: Right;    There were no vitals filed for this visit.   Subjective Assessment - 10/25/20 1158     Subjective Pt reports  some knee and back pain currently. She denies any falls or near-falls.    Pertinent History Kristilyn Coltrane is an 89yoF who comes to [place] referred by PCP for imbalance in walking. 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 Pt also had an acute onset transient episode of AMB 1 month prior wherein she could only AMB in a circle, but not in a straight line. This occurred while out walking a dog with friend, ultimately sustained a fall onto Rt hand and Rt hip, no injury sustained. Pt goes to her beach house each month and recently has felt less safe AMB on soft sand, also no longer attempts to access water alone due to imbalance.    How long can you sit comfortably? not related to this episode    How long can you stand comfortably? not related to this episode    How long can you walk comfortably? not related to this episode    Patient Stated Goals Feel more confident in walking at the beach and be able to safely access the ocean.    Currently in Pain? Yes    Pain Location Back  Treatment  Neuro Re-ed- CGA-min assist throughout  At support surface, standing on airex- WBOS EC - 4x30 sec; improves postural stability with reps NBOS EC - 3x30 sec; very challenging WBOS EO, vertical and horizontal head turns - 10x for each in each direction WBOS EC, vertical and horizontal head turns - 10x for each in each direction. Pt reports fatigue with LE/ankle musculature   Along 10 meter track in clinic: Dynamic gait: horizontal and vertical; 2x for each; pt has greater difficulty with horizontal head turns, shows increased variability with BOS.  At support surface on half-foam: Progression for BLES biased in PF and then DF. Where pt performed the following with each position: EO - 2x30 sec EC - 2x60 sec EC with vertical, horizontal head-turns: 10x each direction for each Pt most challenged with BLEs biased  in DF position on half-foam. Requires up to min assist. Continues to report muscular fatigue in BLEs.  At support surface: SLB 4x30 sec each LLE; greater difficulty with RLE as stance leg  Ambulating in hallway, reading sticky-notes on wall to incorporate vertical, horizontal head-turns - 2x.  At support surface-  Tandem stance 2x30 sec each LE  Pt educated throughout session about proper posture and technique with exercises. Improved exercise technique, movement at target joints, use of target muscles after min to mod verbal, visual, tactile cues.     PT Education - 10/25/20 1159     Education Details exercise technique, body mechanics    Person(s) Educated Patient    Methods Explanation;Demonstration;Verbal cues    Comprehension Verbalized understanding;Returned demonstration              PT Short Term Goals - 10/21/20 1503       PT SHORT TERM GOAL #1   Title After 4 weeks pt will demonstrate SLS balance >30sec bilat.    Baseline Eval: ~3 sec bilat; 7/19: 48 L, 24 sec R    Time 4    Period Weeks    Status Partially Met    Target Date 11/11/20      PT SHORT TERM GOAL #2   Title Pt to demonstrate backwards walking gait speed >0.58ms to reveal improved sagittal plane motor control in bilat ankles.    Baseline 6/13: 0.67 m/s, 7/18: 0.7 m/s    Time 4    Period Weeks    Status Partially Met    Target Date 11/11/20      PT SHORT TERM GOAL #3   Title Pt to demonstrate ability to AMB in thick grass at modI level >2029fwith LRAD and without LOB.    Baseline difficulty with uneven surfaces, avoids at eval: 7/18: deferred to next appointment; 7/25: pt ambulates over 200 ft in thick grass w/o LOB and no AD    Time 4    Period Weeks    Status Achieved    Target Date 11/11/20               PT Long Term Goals - 10/21/20 1504       PT LONG TERM GOAL #1   Title Pt will score 81 or greater on FOTO survey to demonstrate a sustained level of activity tolerance.     Baseline 7/18: unable to assess this session d/t server being down; 7/25: 82    Time 8    Period Weeks    Status Achieved      PT LONG TERM GOAL #2   Title Pt will demonstrate SLS balance >45 seconds shod  to shoe improved ankle/trunk motorcontrol.    Baseline ~3 sec at eval; 7/18: L 48 sec, R: 24 sec    Time 8    Period Weeks    Status Partially Met    Target Date 12/09/20      PT LONG TERM GOAL #3   Title Pt to demonstrate backward walking gait speed ~ 0.38ms or greater to show improved facility in ankle motor control in gait.    Baseline 6/13: 0.67 m/s; 7/18 0.7 m/s    Time 8    Period Weeks    Status Partially Met    Target Date 12/09/20      PT LONG TERM GOAL #4   Title Pt will demonstrate safe and appropriate AMB on unlevel, uneven, and soft surfaces to show independent and safe ability to access beach area ad lib.    Baseline low confidence, concerns of safety at eval; 7/18: pt felt fairly confident amb. ad lib over uneven and soft surface. Reports overal improvement with ability to ambulate on beach. Only shows two instances of decreased stability and step strategy to regain balance; 7/25: pt demos ability to amb. on uneven, unlevel surfaces without LOB, however, reports difficulty in dim lighting    Time 8    Period Weeks    Status Partially Met    Target Date 12/09/20              Plan - 10/25/20 1200     Clinical Impression Statement Pt exhibits wtihin session improvement with balance exercises involving head turns for increased vestibular input and with EC to reduce reliance of visual cues for balance. At first pt challenged with these exercises, requiring up to min a from PT to maintain balance, but with reps relied on intermittent UE support, CGA. She continues to be more challenged with SLB tasks on LLE>RLE. The pt will continue to benefit form further skilled PT services to improve LE strength and balance in order to decrease risk of falls.    Personal Factors and  Comorbidities Past/Current Experience;Time since onset of injury/illness/exacerbation    Examination-Activity Limitations Locomotion Level;Stairs;Squat    Examination-Participation Restrictions Cleaning;Community Activity;Yard Work;Other   going to b[employer]Making Stable/Uncomplicated    Rehab Potential Excellent    PT Frequency 2x / week    PT Duration 8 weeks    PT Treatment/Interventions Balance training;Functional mobility training;Therapeutic activities;Therapeutic exercise;Neuromuscular re-education;Patient/family education;Gait training;Stair training;Ultrasound;Canalith Repostioning;Electrical Stimulation;DME Instruction    PT Next Visit Plan balance on incline, decline, and banked surfaces; assess AMB in grass, ankle inv/ev measurement, balance on compliant surfaces, dynamic balance, balance activities that require increased use of vestibular cues, tasks with EC or in dimly lit areas    PT Home Exercise Plan SLS 10x5secH bilat, standing all-toes dorsiflexion 15x3secH;   ?Access Code: [email or web address] no updates on this date; 7/18: reissued handout of SLS, tandem stance and slow marches for HEP with instruction to perform at support surface with chair behind her and to not perform if feeling increasingly unsteady. Pt verbalized understanding. no updates on this date    Consulted and Agree with Plan of Care Patient             Patient will benefit from skilled therapeutic intervention in order to improve the following deficits and impairments:  Decreased strength, Difficulty walking, Decreased balance, Decreased coordination, Decreased activity tolerance, Decreased knowledge of use of DME, Postural dysfunction, Improper body mechanics  Visit Diagnosis: Unsteadiness on feet  Other abnormalities  of gait and mobility  Muscle weakness (generalized)     Problem List Patient Active Problem List   Diagnosis Date Noted   Cataract cortical, senile 08/03/2017    Chronic colitis 08/03/2017   Medicare annual wellness visit, initial 12/15/2016   Benign essential hypertension 11/04/2016   OA (osteoarthritis) of knee 02/12/2016   Hyperlipidemia, mixed 10/24/2015   Clostridium difficile colitis 01/02/2014   Right bundle branch block 10/18/2013   Ricard Dillon PT, DPT  10/25/2020, 12:08 PM  [place] MAIN [facility] SERVICES [address] [place], [place], [address] Phone: [phone]   Fax:  [phone]  Name: BECCA BAYNE MRN: [id number] Date of Birth: [birth year]

## 2020-10-28 ENCOUNTER — Ambulatory Visit: Payer: Medicare PPO | Attending: Neurology

## 2020-10-28 ENCOUNTER — Other Ambulatory Visit: Payer: Self-pay

## 2020-10-28 DIAGNOSIS — M6281 Muscle weakness (generalized): Secondary | ICD-10-CM | POA: Diagnosis present

## 2020-10-28 DIAGNOSIS — R278 Other lack of coordination: Secondary | ICD-10-CM | POA: Insufficient documentation

## 2020-10-28 DIAGNOSIS — R262 Difficulty in walking, not elsewhere classified: Secondary | ICD-10-CM | POA: Insufficient documentation

## 2020-10-28 DIAGNOSIS — R2689 Other abnormalities of gait and mobility: Secondary | ICD-10-CM | POA: Diagnosis present

## 2020-10-28 DIAGNOSIS — R2681 Unsteadiness on feet: Secondary | ICD-10-CM | POA: Diagnosis present

## 2020-10-28 NOTE — Therapy (Signed)
[place] MAIN [facility] SERVICES [address] [place], [place], [address] Phone: [phone]   Fax:  [phone]  Physical Therapy Treatment  Patient Details  Name: Kiara [name] MRN: [id number] Date of Birth: [birth year] Referring Provider (PT): Emily Filbert MD (PCP)   Encounter Date: 10/28/2020   PT End of Session - 10/28/20 1657     Visit Number 12    Number of Visits 32    Date for PT Re-Evaluation 12/09/20    Authorization Type [employer] Choice PPO    Authorization Time Period 09/06/20-11/01/20    PT Start Time 1016    PT Stop Time 1100    PT Time Calculation (min) 44 min    Equipment Utilized During Treatment Gait belt    Activity Tolerance Patient tolerated treatment well;No increased pain    Behavior During Therapy WFL for tasks assessed/performed             Past Medical History:  Diagnosis Date   Arthritis    osteoarthritis -right knee.   C. difficile colitis    past,no recent issues   Colitis    Diverticulitis    Fall    "tripped"-10 days ago-scrapped right knee and lateral right brow area-" healing"   GERD (gastroesophageal reflux disease)    occ.   Seasonal allergies     Past Surgical History:  Procedure Laterality Date   APPENDECTOMY     CATARACT EXTRACTION, BILATERAL     EYE SURGERY     ? right eye retina repair.   HEMORRHOID SURGERY     KNEE ARTHROSCOPY Right 11/26/2014   Procedure: ARTHROSCOPY KNEE, Tear medical horn and anterior horn. Lateral mesicus tear, grade 3 medial;  Surgeon: Dereck Leep, MD;  Location: [facility] ORS;  Service: Orthopedics;  Laterality: Right;   MOLE REMOVAL     benign   TONSILLECTOMY     TOTAL KNEE ARTHROPLASTY Right 02/12/2016   Procedure: RIGHT TOTAL KNEE ARTHROPLASTY;  Surgeon: Gaynelle Arabian, MD;  Location: [facility];  Service: Orthopedics;  Laterality: Right;    There were no vitals filed for this visit.   Subjective Assessment - 10/28/20 1019     Subjective Pt reports  no pain currently, and no falls or near-falls. She does report she feels stronger and that her stress has improved.    Pertinent History Kiara [name] is an 45yoF who comes to [place] referred by PCP for imbalance in walking. 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 Pt also had an acute onset transient episode of AMB 1 month prior wherein she could only AMB in a circle, but not in a straight line. This occurred while out walking a dog with friend, ultimately sustained a fall onto Rt hand and Rt hip, no injury sustained. Pt goes to her beach house each month and recently has felt less safe AMB on soft sand, also no longer attempts to access water alone due to imbalance.    How long can you sit comfortably? not related to this episode    How long can you stand comfortably? not related to this episode    How long can you walk comfortably? not related to this episode    Patient Stated Goals Feel more confident in walking at the beach and be able to safely access the ocean.    Currently in Pain? No/denies  TREATMENT Neuro Re-ed- CGA-min assist throughout  At support surface- SLB - 4x30 sec; continued intermittent UE support  In clinic gym: Ambulation with EC - 4x length of gym, utilizes stepping strategy, cross-over step, takes small steps Ambulation with semi-tandem stance - 4x length of gym, uses step-strategies Ambulation tandem stance - 4x length of gym, uses step-strategies. Very challenging for pt  Korebalance trainer-  Tux Racer - 4x, emphasis on ankle strategies, hip strategies and weight-shifts. Pt with minimal use of hip-strategies, struggles to meet targets d/t difficult with smaller weight-shifts.   At support surface, standing on 1/2 foam: Progression for BLES biased in PF and then DF. Where pt performed the following with each position: EO - x30 sec for each EC - x60 sec for  each EC with vertical, horizontal head-turns: 10x each direction for each position. Pt continues to be most challenged with BLEs biased in DF position on half-foam. Requires up to min assist. Continues to report muscular fatigue in BLEs.    Pt educated throughout session about proper posture and technique with exercises. Improved exercise technique, movement at target joints, use of target muscles after min to mod verbal, visual, tactile cues.   Plan - 10/28/20 1702     Clinical Impression Statement Pt shows overall improvement reporting she is feeling stronger, steadier outside of PT. While pt is making progress, she is still challenged with both static and dynamic balance tasks that require a narrower BOS. The pt will benefit from further skilled PT to continue to improve LE strength and balance to improve QOL and decrease fall risk.    Personal Factors and Comorbidities Past/Current Experience;Time since onset of injury/illness/exacerbation    Examination-Activity Limitations Locomotion Level;Stairs;Squat    Examination-Participation Restrictions Cleaning;Community Activity;Yard Work;Other   going to [employer] Making Stable/Uncomplicated    Rehab Potential Excellent    PT Frequency 2x / week    PT Duration 8 weeks    PT Treatment/Interventions Balance training;Functional mobility training;Therapeutic activities;Therapeutic exercise;Neuromuscular re-education;Patient/family education;Gait training;Stair training;Ultrasound;Canalith Repostioning;Electrical Stimulation;DME Instruction    PT Next Visit Plan balance on incline, decline, and banked surfaces; assess AMB in grass, ankle inv/ev measurement, balance on compliant surfaces, dynamic balance, balance activities that require increased use of vestibular cues, tasks with EC or in dimly lit areas    PT Home Exercise Plan SLS 10x5secH bilat, standing all-toes dorsiflexion 15x3secH;   ?Access Code: [email or web address]; no updates on  this date; 7/18: reissued handout of SLS, tandem stance and slow marches for HEP with instruction to perform at support surface with chair behind her and to not perform if feeling increasingly unsteady. Pt verbalized understanding. no updates on this date    Consulted and Agree with Plan of Care Patient                PT Short Term Goals - 10/21/20 1503       PT SHORT TERM GOAL #1   Title After 4 weeks pt will demonstrate SLS balance >30sec bilat.    Baseline Eval: ~3 sec bilat; 7/19: 48 L, 24 sec R    Time 4    Period Weeks    Status Partially Met    Target Date 11/11/20      PT SHORT TERM GOAL #2   Title Pt to demonstrate backwards walking gait speed >0.62ms to reveal improved sagittal plane motor control in bilat ankles.    Baseline 6/13: 0.67 m/s, 7/18: 0.7 m/s    Time 4  Period Weeks    Status Partially Met    Target Date 11/11/20      PT SHORT TERM GOAL #3   Title Pt to demonstrate ability to AMB in thick grass at modI level >2107f with LRAD and without LOB.    Baseline difficulty with uneven surfaces, avoids at eval: 7/18: deferred to next appointment; 7/25: pt ambulates over 200 ft in thick grass w/o LOB and no AD    Time 4    Period Weeks    Status Achieved    Target Date 11/11/20               PT Long Term Goals - 10/21/20 1504       PT LONG TERM GOAL #1   Title Pt will score 81 or greater on FOTO survey to demonstrate a sustained level of activity tolerance.    Baseline 7/18: unable to assess this session d/t server being down; 7/25: 82    Time 8    Period Weeks    Status Achieved      PT LONG TERM GOAL #2   Title Pt will demonstrate SLS balance >45 seconds shod to shoe improved ankle/trunk motorcontrol.    Baseline ~3 sec at eval; 7/18: L 48 sec, R: 24 sec    Time 8    Period Weeks    Status Partially Met    Target Date 12/09/20      PT LONG TERM GOAL #3   Title Pt to demonstrate backward walking gait speed ~ 0.960m or greater to show  improved facility in ankle motor control in gait.    Baseline 6/13: 0.67 m/s; 7/18 0.7 m/s    Time 8    Period Weeks    Status Partially Met    Target Date 12/09/20      PT LONG TERM GOAL #4   Title Pt will demonstrate safe and appropriate AMB on unlevel, uneven, and soft surfaces to show independent and safe ability to access beach area ad lib.    Baseline low confidence, concerns of safety at eval; 7/18: pt felt fairly confident amb. ad lib over uneven and soft surface. Reports overal improvement with ability to ambulate on beach. Only shows two instances of decreased stability and step strategy to regain balance; 7/25: pt demos ability to amb. on uneven, unlevel surfaces without LOB, however, reports difficulty in dim lighting    Time 8    Period Weeks    Status Partially Met    Target Date 12/09/20               Patient will benefit from skilled therapeutic intervention in order to improve the following deficits and impairments:  Decreased strength, Difficulty walking, Decreased balance, Decreased coordination, Decreased activity tolerance, Decreased knowledge of use of DME, Postural dysfunction, Improper body mechanics  Visit Diagnosis: Unsteadiness on feet  Muscle weakness (generalized)  Other abnormalities of gait and mobility     Problem List Patient Active Problem List   Diagnosis Date Noted   Cataract cortical, senile 08/03/2017   Chronic colitis 08/03/2017   Medicare annual wellness visit, initial 12/15/2016   Benign essential hypertension 11/04/2016   OA (osteoarthritis) of knee 02/12/2016   Hyperlipidemia, mixed 10/24/2015   Clostridium difficile colitis 01/02/2014   Right bundle branch block 10/18/2013   HaRicard DillonT, DPT 10/28/2020, 5:05 PM  [place] [facility] Facility[facility] [address] [place]hone: [phone] Fax:  [phone]  Name: Kiara [name] MRN: [id number] Date of Birth:  04-Sep-1934

## 2020-10-30 ENCOUNTER — Ambulatory Visit: Payer: Medicare PPO

## 2020-10-30 ENCOUNTER — Other Ambulatory Visit: Payer: Self-pay

## 2020-10-30 DIAGNOSIS — R2689 Other abnormalities of gait and mobility: Secondary | ICD-10-CM

## 2020-10-30 DIAGNOSIS — R2681 Unsteadiness on feet: Secondary | ICD-10-CM | POA: Diagnosis not present

## 2020-10-30 NOTE — Therapy (Signed)
[place] MAIN [facility] SERVICES [address] [place], [place], [address] Phone: [phone]   Fax:  [phone]  Physical Therapy Treatment  Patient Details  Name: Kiara [name] MRN: [id number] Date of Birth: Jul 10, 1934 Referring Provider (PT): Emily Filbert MD (PCP)   Encounter Date: 10/30/2020   PT End of Session - 10/30/20 1119     Visit Number 13    Number of Visits 32    Date for PT Re-Evaluation 12/09/20    Authorization Type [employer] PPO    Authorization Time Period 09/06/20-11/01/20    PT Start Time 0849    PT Stop Time 0929    PT Time Calculation (min) 40 min    Equipment Utilized During Treatment Gait belt    Activity Tolerance Patient tolerated treatment well;No increased pain    Behavior During Therapy WFL for tasks assessed/performed             Past Medical History:  Diagnosis Date   Arthritis    osteoarthritis -right knee.   C. difficile colitis    past,no recent issues   Colitis    Diverticulitis    Fall    "tripped"-10 days ago-scrapped right knee and lateral right brow area-" healing"   GERD (gastroesophageal reflux disease)    occ.   Seasonal allergies     Past Surgical History:  Procedure Laterality Date   APPENDECTOMY     CATARACT EXTRACTION, BILATERAL     EYE SURGERY     ? right eye retina repair.   HEMORRHOID SURGERY     KNEE ARTHROSCOPY Right 11/26/2014   Procedure: ARTHROSCOPY KNEE, Tear medical horn and anterior horn. Lateral mesicus tear, grade 3 medial;  Surgeon: Dereck Leep, MD;  Location: [facility];  Service: Orthopedics;  Laterality: Right;   MOLE REMOVAL     benign   TONSILLECTOMY     TOTAL KNEE ARTHROPLASTY Right 02/12/2016   Procedure: RIGHT TOTAL KNEE ARTHROPLASTY;  Surgeon: Gaynelle Arabian, MD;  Location: [facility];  Service: Orthopedics;  Laterality: Right;    There were no vitals filed for this visit.   Subjective Assessment - 10/30/20 0851     Subjective Pt reports  no near falls or LOB. Pt reports typical aches and pains. Pt reports no other changes.    Pertinent History Matia Zelada is an 21yoF who comes to [place] referred by PCP for imbalance in walking. 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 Pt also had an acute onset transient episode of AMB 1 month prior wherein she could only AMB in a circle, but not in a straight line. This occurred while out walking a dog with friend, ultimately sustained a fall onto Rt hand and Rt hip, no injury sustained. Pt goes to her beach house each month and recently has felt less safe AMB on soft sand, also no longer attempts to access water alone due to imbalance.    How long can you sit comfortably? not related to this episode    How long can you stand comfortably? not related to this episode    How long can you walk comfortably? not related to this episode    Patient Stated Goals Feel more confident in walking at the beach and be able to safely access the ocean.    Currently in Pain? Yes   pt does not specify, reports her usual aches and pains  TREATMENT -  Neuro Re-ed- CGA-min assist throughout   At support surface- SLB - 4x30 sec; intermittent UE support  At support surface: 2x30 sec for the following  NBOS EC firm surface Progressed to dual task - naming qualities about her dog On airex- WBOS EC Progressed to dual task - naming foods; one instance of LOB requiring up to mod a to regain balance. NBOS EC  Ankle rockers; reports is easy Tandem stance 2x30 sec B Use of intermittent UE support with exercises  In clinic hallway: CGA-min a throughout Ambulation with EC - 2x length of hall, utilizes stepping strategy, cross-over step, takes small steps Ambulation with semi-tandem stance - 2x length of hall Ambulation tandem stance - 2x length of hall, uses step-strategies. Very challenging for pt Ambulation with  head turns to read sticky notes on wall - 2x length of hall. Reports she feels she is walking as if "drunk."  In // bars- Stepping forward/backward over hurdle - difficulty leading with R LE both directions, knocks over hurdle 3x  Cone taps for SLB - x multiple reps; progressed to with dual task (counting by 5s forward/backward to/from 100), increases challenge for pt, decreased postural stability.   Pt educated throughout session about proper posture and technique with exercises. Improved exercise technique, movement at target joints, use of target muscles after min to mod verbal, visual, tactile cues.     PT Short Term Goals - 10/21/20 1503       PT SHORT TERM GOAL #1   Title After 4 weeks pt will demonstrate SLS balance >30sec bilat.    Baseline Eval: ~3 sec bilat; 7/19: 48 L, 24 sec R    Time 4    Period Weeks    Status Partially Met    Target Date 11/11/20      PT SHORT TERM GOAL #2   Title Pt to demonstrate backwards walking gait speed >0.17ms to reveal improved sagittal plane motor control in bilat ankles.    Baseline 6/13: 0.67 m/s, 7/18: 0.7 m/s    Time 4    Period Weeks    Status Partially Met    Target Date 11/11/20      PT SHORT TERM GOAL #3   Title Pt to demonstrate ability to AMB in thick grass at modI level >2042fwith LRAD and without LOB.    Baseline difficulty with uneven surfaces, avoids at eval: 7/18: deferred to next appointment; 7/25: pt ambulates over 200 ft in thick grass w/o LOB and no AD    Time 4    Period Weeks    Status Achieved    Target Date 11/11/20               PT Long Term Goals - 10/21/20 1504       PT LONG TERM GOAL #1   Title Pt will score 81 or greater on FOTO survey to demonstrate a sustained level of activity tolerance.    Baseline 7/18: unable to assess this session d/t server being down; 7/25: 82    Time 8    Period Weeks    Status Achieved      PT LONG TERM GOAL #2   Title Pt will demonstrate SLS balance >45 seconds  shod to shoe improved ankle/trunk motorcontrol.    Baseline ~3 sec at eval; 7/18: L 48 sec, R: 24 sec    Time 8    Period Weeks    Status Partially Met    Target Date 12/09/20  PT LONG TERM GOAL #3   Title Pt to demonstrate backward walking gait speed ~ 0.18ms or greater to show improved facility in ankle motor control in gait.    Baseline 6/13: 0.67 m/s; 7/18 0.7 m/s    Time 8    Period Weeks    Status Partially Met    Target Date 12/09/20      PT LONG TERM GOAL #4   Title Pt will demonstrate safe and appropriate AMB on unlevel, uneven, and soft surfaces to show independent and safe ability to access beach area ad lib.    Baseline low confidence, concerns of safety at eval; 7/18: pt felt fairly confident amb. ad lib over uneven and soft surface. Reports overal improvement with ability to ambulate on beach. Only shows two instances of decreased stability and step strategy to regain balance; 7/25: pt demos ability to amb. on uneven, unlevel surfaces without LOB, however, reports difficulty in dim lighting    Time 8    Period Weeks    Status Partially Met    Target Date 12/09/20               Plan - 10/30/20 1120     Clinical Impression Statement Pt able to progress many of her balance exercises to involing a dual task. Pt still requires CGA-min assist throughout and often utilizes intermittent UE support to regain balance. She had some difficulty this session with control stepping over hurdle in posterior direction with RLE leading. The pt will continue to benefit from further skilled PT to continue to improve balace and strength to increase ease and safety with all functional mobility.    Personal Factors and Comorbidities Past/Current Experience;Time since onset of injury/illness/exacerbation    Examination-Activity Limitations Locomotion Level;Stairs;Squat    Examination-Participation Restrictions Cleaning;Community Activity;Yard Work;Other   going to b[employer] DynamicsMaking Stable/Uncomplicated    Rehab Potential Excellent    PT Frequency 2x / week    PT Duration 8 weeks    PT Treatment/Interventions Balance training;Functional mobility training;Therapeutic activities;Therapeutic exercise;Neuromuscular re-education;Patient/family education;Gait training;Stair training;Ultrasound;Canalith Repostioning;Electrical Stimulation;DME Instruction    PT Next Visit Plan balance on incline, decline, and banked surfaces; assess AMB in grass, ankle inv/ev measurement, balance on compliant surfaces, dynamic balance, balance activities that require increased use of vestibular cues, tasks with EC or in dimly lit areas, continue POC    PT Home Exercise Plan SLS 10x5secH bilat, standing all-toes dorsiflexion 15x3secH;   ?Access Code: [email or web address] no updates on this date; 7/18: reissued handout of SLS, tandem stance and slow marches for HEP with instruction to perform at support surface with chair behind her and to not perform if feeling increasingly unsteady. Pt verbalized understanding. no updates on this date, no updates this date    Consulted and Agree with Plan of Care Patient             Patient will benefit from skilled therapeutic intervention in order to improve the following deficits and impairments:  Decreased strength, Difficulty walking, Decreased balance, Decreased coordination, Decreased activity tolerance, Decreased knowledge of use of DME, Postural dysfunction, Improper body mechanics  Visit Diagnosis: Unsteadiness on feet  Other abnormalities of gait and mobility     Problem List Patient Active Problem List   Diagnosis Date Noted   Cataract cortical, senile 08/03/2017   Chronic colitis 08/03/2017   Medicare annual wellness visit, initial 12/15/2016   Benign essential hypertension 11/04/2016   OA (osteoarthritis) of knee 02/12/2016   Hyperlipidemia, mixed  10/24/2015   Clostridium difficile colitis 01/02/2014   Right  bundle branch block 10/18/2013   Ricard Dillon PT, DPT 10/30/2020, 11:27 AM  [place] MAIN [facility] SERVICES [address] [place], [place], [address] Phone: [phone]   Fax:  [phone]  Name: Kiara [name] MRN: [id number] Date of Birth: 1934/12/27

## 2020-11-04 ENCOUNTER — Ambulatory Visit: Payer: Medicare PPO

## 2020-11-06 ENCOUNTER — Ambulatory Visit: Payer: Medicare PPO

## 2020-11-12 ENCOUNTER — Other Ambulatory Visit: Payer: Self-pay

## 2020-11-12 ENCOUNTER — Ambulatory Visit: Payer: Medicare PPO | Admitting: Physical Therapy

## 2020-11-12 DIAGNOSIS — R2689 Other abnormalities of gait and mobility: Secondary | ICD-10-CM

## 2020-11-12 DIAGNOSIS — R2681 Unsteadiness on feet: Secondary | ICD-10-CM | POA: Diagnosis not present

## 2020-11-12 DIAGNOSIS — M6281 Muscle weakness (generalized): Secondary | ICD-10-CM

## 2020-11-12 DIAGNOSIS — R278 Other lack of coordination: Secondary | ICD-10-CM

## 2020-11-12 DIAGNOSIS — R262 Difficulty in walking, not elsewhere classified: Secondary | ICD-10-CM

## 2020-11-12 NOTE — Therapy (Signed)
[place] MAIN [facility] SERVICES [address] [place], [place], [address] Phone: [phone]   Fax:  [phone]  Physical Therapy Treatment  Patient Details  Name: Kiara [name] MRN: [id number] Date of Birth: 1935/03/27 Referring Provider (PT): Emily Filbert MD (PCP)   Encounter Date: 11/12/2020   PT End of Session - 11/12/20 1045     Visit Number 14    Number of Visits 32    Date for PT Re-Evaluation 12/09/20    Authorization Type [employer] Choice PPO    Authorization Time Period 09/06/20-11/01/20    PT Start Time 0950    PT Stop Time 1030    PT Time Calculation (min) 40 min    Equipment Utilized During Treatment Gait belt    Activity Tolerance Patient tolerated treatment well    Behavior During Therapy [facility] for tasks assessed/performed             Past Medical History:  Diagnosis Date   Arthritis    osteoarthritis -right knee.   C. difficile colitis    past,no recent issues   Colitis    Diverticulitis    Fall    "tripped"-10 days ago-scrapped right knee and lateral right brow area-" healing"   GERD (gastroesophageal reflux disease)    occ.   Seasonal allergies     Past Surgical History:  Procedure Laterality Date   APPENDECTOMY     CATARACT EXTRACTION, BILATERAL     EYE SURGERY     ? right eye retina repair.   HEMORRHOID SURGERY     KNEE ARTHROSCOPY Right 11/26/2014   Procedure: ARTHROSCOPY KNEE, Tear medical horn and anterior horn. Lateral mesicus tear, grade 3 medial;  Surgeon: Dereck Leep, MD;  Location: [facility];  Service: Orthopedics;  Laterality: Right;   MOLE REMOVAL     benign   TONSILLECTOMY     TOTAL KNEE ARTHROPLASTY Right 02/12/2016   Procedure: RIGHT TOTAL KNEE ARTHROPLASTY;  Surgeon: Gaynelle Arabian, MD;  Location: [facility];  Service: Orthopedics;  Laterality: Right;    There were no vitals filed for this visit.   Subjective Assessment - 11/12/20 0952     Subjective Pt rports she is well today.  Pt just returned from the beach; had increased difficulty walking in the sand this trip. Pt reports no near falls or LOB. Pt reports typical aches and pains. Pt reports no other changes.    Pertinent History Kiara [name] is an 40yoF who comes to [place] referred by PCP for imbalance in walking. 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 Pt also had an acute onset transient episode of AMB 1 month prior wherein she could only AMB in a circle, but not in a straight line. This occurred while out walking a dog with friend, ultimately sustained a fall onto Rt hand and Rt hip, no injury sustained. Pt goes to her beach house each month and recently has felt less safe AMB on soft sand, also no longer attempts to access water alone due to imbalance.    How long can you sit comfortably? not related to this episode    How long can you stand comfortably? not related to this episode    How long can you walk comfortably? not related to this episode    Patient Stated Goals Feel more confident in walking at the beach and be able to safely access the ocean.  TREATMENT -  Neuro Re-ed- CGA-min assist throughout   At support surface- SLB - 4x30 sec; intermittent UE support.   At support surface: 2x30 sec for the following  NBOS EC firm surface; dual task - counting by 4 to 100. On airex- WBOS EC; dual task - naming colors. NBOS EC - very difficult for pt. Heel raises x20. Tandem stance 2x30 sec B. Use of intermittent UE support with exercises - utilizes ankle and hip strategies prior to UE support.   In clinic hallway: CGA-min a throughout Ambulation with EC - 2x length of hall, utilizes stepping strategy, cross-over step, decreased step length bilaterally with wide BOS. Ambulation with semi-tandem stance - 2x length of hall. Ambulation tandem stance - 1x length of hall, uses step-strategies. Very  challenging for pt. Ambulation with head turns to read sticky notes on wall - 2x length of hall.  Reports she feels she is walking as if "drunk" with head turns and semi-tandem/tandem patterns.   At support bar:  Cone taps, alternating x20 reps; single leg for SLB x20 each.     Pt educated throughout session about proper posture and technique with exercises. Improved exercise technique, movement at target joints, use of target muscles after min to mod verbal, visual, tactile cues.    Clinical Impression Pt able to maintain dual task progressions with balance exercises. Pt still requires CGA-min assist throughout and often utilizes intermittent UE support to regain balance. She does report that she feels her performance during static standing balance exercises has improved since last session. In general, pt performance improved during second set of each exercise. The pt will continue to benefit from further skilled PT to continue to improve balace and strength to increase ease and safety with all functional mobility.           PT Short Term Goals - 10/21/20 1503       PT SHORT TERM GOAL #1   Title After 4 weeks pt will demonstrate SLS balance >30sec bilat.    Baseline Eval: ~3 sec bilat; 7/19: 48 L, 24 sec R    Time 4    Period Weeks    Status Partially Met    Target Date 11/11/20      PT SHORT TERM GOAL #2   Title Pt to demonstrate backwards walking gait speed >0.75ms to reveal improved sagittal plane motor control in bilat ankles.    Baseline 6/13: 0.67 m/s, 7/18: 0.7 m/s    Time 4    Period Weeks    Status Partially Met    Target Date 11/11/20      PT SHORT TERM GOAL #3   Title Pt to demonstrate ability to AMB in thick grass at modI level >208fwith LRAD and without LOB.    Baseline difficulty with uneven surfaces, avoids at eval: 7/18: deferred to next appointment; 7/25: pt ambulates over 200 ft in thick grass w/o LOB and no AD    Time 4    Period Weeks    Status  Achieved    Target Date 11/11/20               PT Long Term Goals - 10/21/20 1504       PT LONG TERM GOAL #1   Title Pt will score 81 or greater on FOTO survey to demonstrate a sustained level of activity tolerance.    Baseline 7/18: unable to assess this session d/t server being down; 7/25: 82    Time 8  Period Weeks    Status Achieved      PT LONG TERM GOAL #2   Title Pt will demonstrate SLS balance >45 seconds shod to shoe improved ankle/trunk motorcontrol.    Baseline ~3 sec at eval; 7/18: L 48 sec, R: 24 sec    Time 8    Period Weeks    Status Partially Met    Target Date 12/09/20      PT LONG TERM GOAL #3   Title Pt to demonstrate backward walking gait speed ~ 0.28ms or greater to show improved facility in ankle motor control in gait.    Baseline 6/13: 0.67 m/s; 7/18 0.7 m/s    Time 8    Period Weeks    Status Partially Met    Target Date 12/09/20      PT LONG TERM GOAL #4   Title Pt will demonstrate safe and appropriate AMB on unlevel, uneven, and soft surfaces to show independent and safe ability to access beach area ad lib.    Baseline low confidence, concerns of safety at eval; 7/18: pt felt fairly confident amb. ad lib over uneven and soft surface. Reports overal improvement with ability to ambulate on beach. Only shows two instances of decreased stability and step strategy to regain balance; 7/25: pt demos ability to amb. on uneven, unlevel surfaces without LOB, however, reports difficulty in dim lighting    Time 8    Period Weeks    Status Partially Met    Target Date 12/09/20                   Plan - 11/12/20 1045     Clinical Impression Statement Pt able to maintain dual task progressions with balance exercises. Pt still requires CGA-min assist throughout and often utilizes intermittent UE support to regain balance. She does report that she feels her performance during static standing balance exercises has improved since last session. In  general, pt performance improved during second set of each exercise. The pt will continue to benefit from further skilled PT to continue to improve balace and strength to increase ease and safety with all functional mobility.    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    Rehab Potential Excellent    PT Frequency 2x / week    PT Duration 8 weeks    PT Treatment/Interventions Balance training;Functional mobility training;Therapeutic activities;Therapeutic exercise;Neuromuscular re-education;Patient/family education;Gait training;Stair training;Ultrasound;Canalith Repostioning;Electrical Stimulation;DME Instruction    PT Next Visit Plan balance on incline, decline, and banked surfaces; assess AMB in grass, ankle inv/ev measurement, balance on compliant surfaces, dynamic balance, balance activities that require increased use of vestibular cues, tasks with EC or in dimly lit areas, continue POC    PT Home Exercise Plan SLS 10x5secH bilat, standing all-toes dorsiflexion 15x3secH;   ?Access Code: [email or web address] no updates on this date; 7/18: reissued handout of SLS, tandem stance and slow marches for HEP with instruction to perform at support surface with chair behind her and to not perform if feeling increasingly unsteady. Pt verbalized understanding. no updates on this date, no updates this date    Consulted and Agree with Plan of Care Patient             Patient will benefit from skilled therapeutic intervention in order to improve the following deficits and impairments:  Decreased strength, Difficulty walking, Decreased balance, Decreased coordination, Decreased activity tolerance, Decreased knowledge of use of DME,  Postural dysfunction, Improper body mechanics  Visit Diagnosis: Unsteadiness on feet  Other abnormalities of gait and mobility  Muscle weakness (generalized)  Other lack of coordination  Difficulty in walking, not elsewhere classified     Problem List Patient Active Problem List   Diagnosis Date Noted   Cataract cortical, senile 08/03/2017   Chronic colitis 08/03/2017   Medicare annual wellness visit, initial 12/15/2016   Benign essential hypertension 11/04/2016   OA (osteoarthritis) of knee 02/12/2016   Hyperlipidemia, mixed 10/24/2015   Clostridium difficile colitis 01/02/2014   Right bundle branch block 10/18/2013    Kiara [name] PT, DPT  Kiara [name] 11/12/2020, 10:47 AM  [place] [address] [place], [place], [address] Phone: [phone]   Fax:  [phone]  Name: Kiara [name] MRN: [id number] Date of Birth: Feb 05, 1935

## 2020-11-15 ENCOUNTER — Ambulatory Visit: Payer: Medicare PPO

## 2020-11-18 ENCOUNTER — Ambulatory Visit: Payer: Medicare PPO

## 2020-11-19 ENCOUNTER — Telehealth: Payer: Self-pay

## 2020-11-19 ENCOUNTER — Ambulatory Visit: Payer: Medicare PPO

## 2020-11-19 NOTE — Telephone Encounter (Signed)
Pt did not show for 4:15 PT appointment. Author called patient, LVM. Pt called back 15 minutes later, reports she had intended to come, then took a nap and overslept/forgot. Confirms next appointment date. Pt plans on being here.   5:16 PM, 11/19/20 Etta Grandchild, PT, DPT Physical Therapist - [place] [phone]

## 2020-11-22 ENCOUNTER — Other Ambulatory Visit: Payer: Self-pay

## 2020-11-22 ENCOUNTER — Ambulatory Visit: Payer: Medicare PPO

## 2020-11-22 DIAGNOSIS — R278 Other lack of coordination: Secondary | ICD-10-CM

## 2020-11-22 DIAGNOSIS — R2681 Unsteadiness on feet: Secondary | ICD-10-CM

## 2020-11-22 DIAGNOSIS — R2689 Other abnormalities of gait and mobility: Secondary | ICD-10-CM

## 2020-11-22 NOTE — Therapy (Signed)
[place] MAIN [facility] SERVICES [address] [place], [place], [address] Phone: [phone]   Fax:  [phone]  Physical Therapy Treatment  Patient Details  Name: Kiara [name] MRN: [id number] Date of Birth: 12-26-1934 Referring Provider (PT): Emily Filbert MD (PCP)   Encounter Date: 11/22/2020   PT End of Session - 11/22/20 1047     Visit Number 15    Number of Visits 32    Date for PT Re-Evaluation 12/09/20    Authorization Type [employer] Choice PPO: authorized for 16 visits 09/09/20-11/27/20    Authorization Time Period Cert 4/91/79-04/04/03; Cert 6/97/94-10/29/63    Authorization - Visit Number 14    Authorization - Number of Visits 16    Progress Note Due on Visit 20    PT Start Time 0846    PT Stop Time 0928    PT Time Calculation (min) 42 min    Equipment Utilized During Treatment Gait belt    Activity Tolerance Patient tolerated treatment well    Behavior During Therapy WFL for tasks assessed/performed             Past Medical History:  Diagnosis Date   Arthritis    osteoarthritis -right knee.   C. difficile colitis    past,no recent issues   Colitis    Diverticulitis    Fall    "tripped"-10 days ago-scrapped right knee and lateral right brow area-" healing"   GERD (gastroesophageal reflux disease)    occ.   Seasonal allergies     Past Surgical History:  Procedure Laterality Date   APPENDECTOMY     CATARACT EXTRACTION, BILATERAL     EYE SURGERY     ? right eye retina repair.   HEMORRHOID SURGERY     KNEE ARTHROSCOPY Right 11/26/2014   Procedure: ARTHROSCOPY KNEE, Tear medical horn and anterior horn. Lateral mesicus tear, grade 3 medial;  Surgeon: Dereck Leep, MD;  Location: [facility] ORS;  Service: Orthopedics;  Laterality: Right;   MOLE REMOVAL     benign   TONSILLECTOMY     TOTAL KNEE ARTHROPLASTY Right 02/12/2016   Procedure: RIGHT TOTAL KNEE ARTHROPLASTY;  Surgeon: Gaynelle Arabian, MD;  Location: [facility];   Service: Orthopedics;  Laterality: Right;    There were no vitals filed for this visit.   Subjective Assessment - 11/22/20 0850     Subjective Pt reports balance overall has been good except when she went to the beach. She says she had poor balance on vacation in general, and had difficulty with her HEP. She reports "my head has been stopped-up badly."  She says this is a chronic issue but has been worse recently. She has ENT appointment for the middle of September. She reports no spinning, no vertigo.    Pertinent History Kiara [name] is an 49yoF who comes to [place] referred by PCP for imbalance in walking. 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 Pt also had an acute onset transient episode of AMB 1 month prior wherein she could only AMB in a circle, but not in a straight line. This occurred while out walking a dog with friend, ultimately sustained a fall onto Rt hand and Rt hip, no injury sustained. Pt goes to her beach house each month and recently has felt less safe AMB on soft sand, also no longer attempts to access water alone due to imbalance.    How  long can you sit comfortably? not related to this episode    How long can you stand comfortably? not related to this episode    How long can you walk comfortably? not related to this episode    Patient Stated Goals Feel more confident in walking at the beach and be able to safely access the ocean.            TREATMENT -   Neuro Re-ed- CGA-min assist throughout   At support surface- SLB - 4x45-90 sec BLEs; intermittent UE support. Tandem stance 30 sec BLEs; easy-medium --progressed to dual cog. task, naming people and animals 30-45 sec each LE 2x through. Rates medium In // bars: Tandem walking forward/backward 8x. Frequent use of step-strategy. Rates fairly difficult but improves performance with reps.  On airex: Standing with vertical,  horizontal head-turns, EO 10x for each, each direction; exhibits good postural stability EC, WBOS - 6x30 sec, very challenging multiple LOB that requires up to min a to regain balance. Pt does improve with reps.  --progressed to with vertical, horizontal head turns 10x each, each direction; medium difficulty  Standing with each LE on a dynadisc 2x60 sec --progressed to incorporating trunk twists and reach with ball, side-to-side - very challenging for pt  SLB progression with soccer ball - forward/backward, side-to-side, CW/CC - x multiple reps of each for each LE. Intermittent UE support    Pt educated throughout session about proper posture and technique with exercises. Improved exercise technique, movement at target joints, use of target muscles after min to mod verbal, visual, tactile cues.   Assessment: Pt able to continue progression to higher level balance tasks involving cog. dual task. While pt still challenged with interventions that require increased reliance of vestibular cues, she showed within session improvement with postural stabliity. She required CGA-min a throughout and utilized intermittent UE support. The pt will benefit from further skilled PT in order to improve balance to decrease fall risk.       PT Education - 11/22/20 0939     Education Details exercise technique, body mechanics    Person(s) Educated Patient    Methods Explanation;Demonstration;Verbal cues    Comprehension Verbalized understanding;Returned demonstration              PT Short Term Goals - 10/21/20 1503       PT SHORT TERM GOAL #1   Title After 4 weeks pt will demonstrate SLS balance >30sec bilat.    Baseline Eval: ~3 sec bilat; 7/19: 48 L, 24 sec R    Time 4    Period Weeks    Status Partially Met    Target Date 11/11/20      PT SHORT TERM GOAL #2   Title Pt to demonstrate backwards walking gait speed >0.12ms to reveal improved sagittal plane motor control in bilat ankles.     Baseline 6/13: 0.67 m/s, 7/18: 0.7 m/s    Time 4    Period Weeks    Status Partially Met    Target Date 11/11/20      PT SHORT TERM GOAL #3   Title Pt to demonstrate ability to AMB in thick grass at modI level >2030fwith LRAD and without LOB.    Baseline difficulty with uneven surfaces, avoids at eval: 7/18: deferred to next appointment; 7/25: pt ambulates over 200 ft in thick grass w/o LOB and no AD    Time 4    Period Weeks    Status Achieved    Target  Date 11/11/20               PT Long Term Goals - 10/21/20 1504       PT LONG TERM GOAL #1   Title Pt will score 81 or greater on FOTO survey to demonstrate a sustained level of activity tolerance.    Baseline 7/18: unable to assess this session d/t server being down; 7/25: 82    Time 8    Period Weeks    Status Achieved      PT LONG TERM GOAL #2   Title Pt will demonstrate SLS balance >45 seconds shod to shoe improved ankle/trunk motorcontrol.    Baseline ~3 sec at eval; 7/18: L 48 sec, R: 24 sec    Time 8    Period Weeks    Status Partially Met    Target Date 12/09/20      PT LONG TERM GOAL #3   Title Pt to demonstrate backward walking gait speed ~ 0.67ms or greater to show improved facility in ankle motor control in gait.    Baseline 6/13: 0.67 m/s; 7/18 0.7 m/s    Time 8    Period Weeks    Status Partially Met    Target Date 12/09/20      PT LONG TERM GOAL #4   Title Pt will demonstrate safe and appropriate AMB on unlevel, uneven, and soft surfaces to show independent and safe ability to access beach area ad lib.    Baseline low confidence, concerns of safety at eval; 7/18: pt felt fairly confident amb. ad lib over uneven and soft surface. Reports overal improvement with ability to ambulate on beach. Only shows two instances of decreased stability and step strategy to regain balance; 7/25: pt demos ability to amb. on uneven, unlevel surfaces without LOB, however, reports difficulty in dim lighting    Time 8     Period Weeks    Status Partially Met    Target Date 12/09/20                   Plan - 11/22/20 1052     Clinical Impression Statement Pt able to continue progression to higher level balance tasks involving cog. dual task. While pt still challenged with interventions that require increased reliance of vestibular cues, she showed within session improvement with postural stabliity. She required CGA-min a throughout and utilized intermittent UE support. The pt will benefit from further skilled PT in order to improve balance to decrease fall risk.    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    Rehab Potential Excellent    PT Frequency 2x / week    PT Duration 8 weeks    PT Treatment/Interventions Balance training;Functional mobility training;Therapeutic activities;Therapeutic exercise;Neuromuscular re-education;Patient/family education;Gait training;Stair training;Ultrasound;Canalith Repostioning;Electrical Stimulation;DME Instruction    PT Next Visit Plan balance on incline, decline, and banked surfaces; assess AMB in grass, ankle inv/ev measurement, balance on compliant surfaces, dynamic balance, balance activities that require increased use of vestibular cues, tasks with EC or in dimly lit areas, continue POC    PT Home Exercise Plan SLS 10x5secH bilat, standing all-toes dorsiflexion 15x3secH;   ?Access Code: [email or web address] no updates on this date; 7/18: reissued handout of SLS, tandem stance and slow marches for HEP with instruction to perform at support surface with chair behind  her and to not perform if feeling increasingly unsteady. Pt verbalized understanding. no updates on this date, no updates this  date    Consulted and Agree with Plan of Care Patient             Patient will benefit from skilled therapeutic intervention in order to improve the following deficits and impairments:  Decreased strength, Difficulty walking, Decreased balance, Decreased coordination, Decreased activity tolerance, Decreased knowledge of use of DME, Postural dysfunction, Improper body mechanics  Visit Diagnosis: Unsteadiness on feet  Other abnormalities of gait and mobility  Other lack of coordination     Problem List Patient Active Problem List   Diagnosis Date Noted   Cataract cortical, senile 08/03/2017   Chronic colitis 08/03/2017   Medicare annual wellness visit, initial 12/15/2016   Benign essential hypertension 11/04/2016   OA (osteoarthritis) of knee 02/12/2016   Hyperlipidemia, mixed 10/24/2015   Clostridium difficile colitis 01/02/2014   Right bundle branch block 10/18/2013   Ricard Dillon PT, DPT 11/22/2020, 10:53 AM  [place] [address] [place], [place], [address] Phone: [phone]   Fax:  [phone]  Name: Kiara [name] MRN: [id number] Date of Birth: 1935/01/03

## 2020-11-25 ENCOUNTER — Other Ambulatory Visit: Payer: Self-pay

## 2020-11-25 ENCOUNTER — Ambulatory Visit: Payer: Medicare PPO

## 2020-11-25 DIAGNOSIS — R278 Other lack of coordination: Secondary | ICD-10-CM

## 2020-11-25 DIAGNOSIS — R2681 Unsteadiness on feet: Secondary | ICD-10-CM | POA: Diagnosis not present

## 2020-11-25 DIAGNOSIS — R2689 Other abnormalities of gait and mobility: Secondary | ICD-10-CM

## 2020-11-25 NOTE — Therapy (Signed)
[place] MAIN [facility] SERVICES [address] [place], [place], [address] Phone: [phone]   Fax:  [phone]  Physical Therapy Treatment  Patient Details  Name: Kiara [name] MRN: [id number] Date of Birth: December 03, 1934 Referring Provider (PT): Emily Filbert MD (PCP)   Encounter Date: 11/25/2020   PT End of Session - 11/25/20 1702     Visit Number 16    Number of Visits 32    Date for PT Re-Evaluation 12/09/20    Authorization Type [employer] Choice PPO: authorized for 16 visits 09/09/20-11/27/20    Authorization Time Period Cert 09/21/61-11/06/35; Cert 3/42/87-6/81/15    Authorization - Visit Number 14    Authorization - Number of Visits 16    Progress Note Due on Visit 20    PT Start Time 0847    PT Stop Time 0928    PT Time Calculation (min) 41 min    Equipment Utilized During Treatment Gait belt    Activity Tolerance Patient tolerated treatment well    Behavior During Therapy WFL for tasks assessed/performed             Past Medical History:  Diagnosis Date   Arthritis    osteoarthritis -right knee.   C. difficile colitis    past,no recent issues   Colitis    Diverticulitis    Fall    "tripped"-10 days ago-scrapped right knee and lateral right brow area-" healing"   GERD (gastroesophageal reflux disease)    occ.   Seasonal allergies     Past Surgical History:  Procedure Laterality Date   APPENDECTOMY     CATARACT EXTRACTION, BILATERAL     EYE SURGERY     ? right eye retina repair.   HEMORRHOID SURGERY     KNEE ARTHROSCOPY Right 11/26/2014   Procedure: ARTHROSCOPY KNEE, Tear medical horn and anterior horn. Lateral mesicus tear, grade 3 medial;  Surgeon: Dereck Leep, MD;  Location: [facility] ORS;  Service: Orthopedics;  Laterality: Right;   MOLE REMOVAL     benign   TONSILLECTOMY     TOTAL KNEE ARTHROPLASTY Right 02/12/2016   Procedure: RIGHT TOTAL KNEE ARTHROPLASTY;  Surgeon: Gaynelle Arabian, MD;  Location: [facility];   Service: Orthopedics;  Laterality: Right;    There were no vitals filed for this visit.   Subjective Assessment - 11/25/20 0848     Subjective Pt had a good weekend. She visited with her great grandchild. She says balance was good. She says stamina wasn't as good. Reports her usual knee pain.    Pertinent History Kiara [name] is an 33yoF who comes to [place] referred by PCP for imbalance in walking. 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 Pt also had an acute onset transient episode of AMB 1 month prior wherein she could only AMB in a circle, but not in a straight line. This occurred while out walking a dog with friend, ultimately sustained a fall onto Rt hand and Rt hip, no injury sustained. Pt goes to her beach house each month and recently has felt less safe AMB on soft sand, also no longer attempts to access water alone due to imbalance.    How long can you sit comfortably? not related to this episode    How long can you stand comfortably? not related to this episode    How long can you walk comfortably? not related to this episode  Patient Stated Goals Feel more confident in walking at the beach and be able to safely access the ocean.    Currently in Pain? Yes    Pain Location Knee    Pain Orientation Right;Left              TREATMENT -    Neuro Re-ed- CGA-min assist throughout   At support surface-  SLB - 4x30 sec BLEs; rates medium. Rates standing on LLE is more difficult. Intermittent UE support. Requests rest break after 2nd set d/t fatigue.   SLB progression with soccer ball - forward/backward, side-to-side, CW/CC - x10 reps of each for each LE. Intermittent UE support Second round focusing on CW/CC for 10x each LE. Pt reports difficulty with exercise (CW/CC).  Tandem stance 2x30 sec BLEs; --progressed to dual cog. task, naming people, foods and animals 30-45 sec each LE 2x  through each LE. Rates medium. Intermittent UE support.   In // bars: On Airex:   EC, NBOS-2x60 sec, still very challenging with multiple LOB that requires up to min a to regain balance. Improves with reps. --progressed to with vertical, horizontal head turns 10x each, each direction; medium difficulty. No LOB with vertical head turns, shows improvement   Korebalance for training of anticipatory postural control, limits of stabilty/weight-shifts, ankle strategies --- 4x varying Tux Racer courses, decreasing UE support to two finger support. CGA throughout.      Pt educated throughout session about proper posture and technique with exercises. Improved exercise technique, movement at target joints, use of target muscles after min to mod verbal, visual, tactile cues.     PT Short Term Goals - 10/21/20 1503       PT SHORT TERM GOAL #1   Title After 4 weeks pt will demonstrate SLS balance >30sec bilat.    Baseline Eval: ~3 sec bilat; 7/19: 48 L, 24 sec R    Time 4    Period Weeks    Status Partially Met    Target Date 11/11/20      PT SHORT TERM GOAL #2   Title Pt to demonstrate backwards walking gait speed >0.41ms to reveal improved sagittal plane motor control in bilat ankles.    Baseline 6/13: 0.67 m/s, 7/18: 0.7 m/s    Time 4    Period Weeks    Status Partially Met    Target Date 11/11/20      PT SHORT TERM GOAL #3   Title Pt to demonstrate ability to AMB in thick grass at modI level >2036fwith LRAD and without LOB.    Baseline difficulty with uneven surfaces, avoids at eval: 7/18: deferred to next appointment; 7/25: pt ambulates over 200 ft in thick grass w/o LOB and no AD    Time 4    Period Weeks    Status Achieved    Target Date 11/11/20               PT Long Term Goals - 10/21/20 1504       PT LONG TERM GOAL #1   Title Pt will score 81 or greater on FOTO survey to demonstrate a sustained level of activity tolerance.    Baseline 7/18: unable to assess this  session d/t server being down; 7/25: 82    Time 8    Period Weeks    Status Achieved      PT LONG TERM GOAL #2   Title Pt will demonstrate SLS balance >45 seconds shod to shoe improved ankle/trunk motorcontrol.  Baseline ~3 sec at eval; 7/18: L 48 sec, R: 24 sec    Time 8    Period Weeks    Status Partially Met    Target Date 12/09/20      PT LONG TERM GOAL #3   Title Pt to demonstrate backward walking gait speed ~ 0.33ms or greater to show improved facility in ankle motor control in gait.    Baseline 6/13: 0.67 m/s; 7/18 0.7 m/s    Time 8    Period Weeks    Status Partially Met    Target Date 12/09/20      PT LONG TERM GOAL #4   Title Pt will demonstrate safe and appropriate AMB on unlevel, uneven, and soft surfaces to show independent and safe ability to access beach area ad lib.    Baseline low confidence, concerns of safety at eval; 7/18: pt felt fairly confident amb. ad lib over uneven and soft surface. Reports overal improvement with ability to ambulate on beach. Only shows two instances of decreased stability and step strategy to regain balance; 7/25: pt demos ability to amb. on uneven, unlevel surfaces without LOB, however, reports difficulty in dim lighting    Time 8    Period Weeks    Status Partially Met    Target Date 12/09/20                Plan - 11/25/20 1705     Clinical Impression Statement Continued focus on higher level balance training, including use of dual task. Pt was able to show some within-session improvement with dual-task intervention, rating exercise medium. Pt overall most challenged with SLB progression with soccer ball performing CW/CC movements, requiring frequent UE support. Pt also trained in anticipatory postural control/limits of stabliity with Korebalance on this date. She was able to progress intervention to use of 2-finger support. The pt will continue to benefit from further skilled PT in order to improve balance and decrease fall  risk.    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    Rehab Potential Excellent    PT Frequency 2x / week    PT Duration 8 weeks    PT Treatment/Interventions Balance training;Functional mobility training;Therapeutic activities;Therapeutic exercise;Neuromuscular re-education;Patient/family education;Gait training;Stair training;Ultrasound;Canalith Repostioning;Electrical Stimulation;DME Instruction    PT Next Visit Plan balance on incline, decline, and banked surfaces; assess AMB in grass, ankle inv/ev measurement, balance on compliant surfaces, dynamic balance, balance activities that require increased use of vestibular cues, tasks with EC or in dimly lit areas, continue POC    PT Home Exercise Plan SLS 10x5secH bilat, standing all-toes dorsiflexion 15x3secH;   ?Access Code: [email or web address] no updates on this date; 7/18: reissued handout of SLS, tandem stance and slow marches for HEP with instruction to perform at support surface with chair behind her and to not perform if feeling increasingly unsteady. Pt verbalized understanding. no updates on this date, no updates this date    Consulted and Agree with Plan of Care Patient             Patient will benefit from skilled therapeutic intervention in order to improve the following deficits and impairments:  Decreased strength, Difficulty walking, Decreased balance, Decreased coordination, Decreased activity tolerance, Decreased knowledge of use of DME, Postural dysfunction, Improper body mechanics  Visit Diagnosis: Unsteadiness on feet  Other lack  of coordination  Other abnormalities of gait and mobility   Problem List Patient Active Problem List    Diagnosis Date Noted   Cataract cortical, senile 08/03/2017   Chronic colitis 08/03/2017   Medicare annual wellness visit, initial 12/15/2016   Benign essential hypertension 11/04/2016   OA (osteoarthritis) of knee 02/12/2016   Hyperlipidemia, mixed 10/24/2015   Clostridium difficile colitis 01/02/2014   Right bundle branch block 10/18/2013   Ricard Dillon PT, DPT 11/25/2020, 5:08 PM  [place] MAIN [facility] SERVICES [address] [place], [place], [address] Phone: [phone]   Fax:  [phone]  Name: Kiara [name] MRN: [id number] Date of Birth: [birth year]

## 2020-11-27 ENCOUNTER — Ambulatory Visit: Payer: Medicare PPO

## 2020-11-27 ENCOUNTER — Other Ambulatory Visit: Payer: Self-pay

## 2020-11-27 DIAGNOSIS — R278 Other lack of coordination: Secondary | ICD-10-CM

## 2020-11-27 DIAGNOSIS — R2681 Unsteadiness on feet: Secondary | ICD-10-CM | POA: Diagnosis not present

## 2020-11-27 DIAGNOSIS — R2689 Other abnormalities of gait and mobility: Secondary | ICD-10-CM

## 2020-11-27 NOTE — Therapy (Signed)
[place] MAIN [facility] SERVICES [address] [place], [place], [address] Phone: [phone]   Fax:  [phone]  Physical Therapy Treatment  Patient Details  Name: Kiara [name] MRN: [id number] Date of Birth: [birth year] Referring Provider (PT): Emily Filbert MD (PCP)   Encounter Date: 11/27/2020   PT End of Session - 11/27/20 1508     Visit Number 17    Number of Visits 32    Date for PT Re-Evaluation 12/09/20    Authorization Type [employer] Choice PPO: authorized for 16 visits 09/09/20-11/27/20    Authorization Time Period Cert 7/68/11-08/03/24; Cert 05/02/53-9/74/16    Authorization - Visit Number 14    Authorization - Number of Visits 16    Progress Note Due on Visit 20    PT Start Time 1104    PT Stop Time 1145    PT Time Calculation (min) 41 min    Equipment Utilized During Treatment Gait belt    Activity Tolerance Patient tolerated treatment well    Behavior During Therapy WFL for tasks assessed/performed             Past Medical History:  Diagnosis Date   Arthritis    osteoarthritis -right knee.   C. difficile colitis    past,no recent issues   Colitis    Diverticulitis    Fall    "tripped"-10 days ago-scrapped right knee and lateral right brow area-" healing"   GERD (gastroesophageal reflux disease)    occ.   Seasonal allergies     Past Surgical History:  Procedure Laterality Date   APPENDECTOMY     CATARACT EXTRACTION, BILATERAL     EYE SURGERY     ? right eye retina repair.   HEMORRHOID SURGERY     KNEE ARTHROSCOPY Right 11/26/2014   Procedure: ARTHROSCOPY KNEE, Tear medical horn and anterior horn. Lateral mesicus tear, grade 3 medial;  Surgeon: Dereck Leep, MD;  Location: [facility] ORS;  Service: Orthopedics;  Laterality: Right;   MOLE REMOVAL     benign   TONSILLECTOMY     TOTAL KNEE ARTHROPLASTY Right 02/12/2016   Procedure: RIGHT TOTAL KNEE ARTHROPLASTY;  Surgeon: Gaynelle Arabian, MD;  Location: [facility];   Service: Orthopedics;  Laterality: Right;    There were no vitals filed for this visit.   Subjective Assessment - 11/27/20 1110     Subjective Pt is going on vacation later today and will not be back for another visit until mid-September.  She reports her usual knee pain.    Pertinent History Kiara [name] is an 6yoF who comes to [place] referred by PCP for imbalance in walking. 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 Pt also had an acute onset transient episode of AMB 1 month prior wherein she could only AMB in a circle, but not in a straight line. This occurred while out walking a dog with friend, ultimately sustained a fall onto Rt hand and Rt hip, no injury sustained. Pt goes to her beach house each month and recently has felt less safe AMB on soft sand, also no longer attempts to access water alone due to imbalance.    How long can you sit comfortably? not related to this episode    How long can you stand comfortably? not related to this episode    How long can you walk comfortably? not related to this episode    Patient  Stated Goals Feel more confident in walking at the beach and be able to safely access the ocean.    Currently in Pain? Yes    Pain Location Knee    Pain Orientation Right;Left            TREATMENT -    Neuro Re-ed- CGA-min assist throughout  FOTO: 77   At support surface-   SLB - 4x30 sec BLEs; rates medium. Rest break after 3rd set. Continued difficulty with LLE.    SLB progression with soccer ball - CW/CC - 2x10 reps of each for each LE. Intermittent UE support.  Cone taps (3 cones) each LE, multiple rounds, rates hard. Requires intermittent UE support.   Tandem walking forward/backward - 8x length of // bars; rates medium to hard  Ambulating over 10 meters with vertical, horizontal head-turns 4-6x for each length of track. Pt more challenged with vertical head  turns, increased variability in BOS.  On Airex:  EC, NBOS-2x60 sec, still very challenging, however, pt shows within-session improvement in stability. --progressed to with vertical, horizontal head turns 10x each, each direction for 2 rounds. Still requires frequent use of UE support.   Airex beam side stepping - 6x back and forth length of bars Airex beam tandem walking -6x, rates hard    Pt educated throughout session about proper posture and technique with exercises. Improved exercise technique, movement at target joints, use of target muscles after min to mod verbal, visual, tactile cues.   PT Education - 11/27/20 1111     Education Details exercise technique, bodymechanics    Person(s) Educated Patient    Methods Explanation;Demonstration;Verbal cues    Comprehension Verbalized understanding;Returned demonstration              PT Short Term Goals - 10/21/20 1503       PT SHORT TERM GOAL #1   Title After 4 weeks pt will demonstrate SLS balance >30sec bilat.    Baseline Eval: ~3 sec bilat; 7/19: 48 L, 24 sec R    Time 4    Period Weeks    Status Partially Met    Target Date 11/11/20      PT SHORT TERM GOAL #2   Title Pt to demonstrate backwards walking gait speed >0.29ms to reveal improved sagittal plane motor control in bilat ankles.    Baseline 6/13: 0.67 m/s, 7/18: 0.7 m/s    Time 4    Period Weeks    Status Partially Met    Target Date 11/11/20      PT SHORT TERM GOAL #3   Title Pt to demonstrate ability to AMB in thick grass at modI level >2043fwith LRAD and without LOB.    Baseline difficulty with uneven surfaces, avoids at eval: 7/18: deferred to next appointment; 7/25: pt ambulates over 200 ft in thick grass w/o LOB and no AD    Time 4    Period Weeks    Status Achieved    Target Date 11/11/20               PT Long Term Goals - 10/21/20 1504       PT LONG TERM GOAL #1   Title Pt will score 81 or greater on FOTO survey to demonstrate a  sustained level of activity tolerance.    Baseline 7/18: unable to assess this session d/t server being down; 7/25: 82    Time 8    Period Weeks    Status Achieved  PT LONG TERM GOAL #2   Title Pt will demonstrate SLS balance >45 seconds shod to shoe improved ankle/trunk motorcontrol.    Baseline ~3 sec at eval; 7/18: L 48 sec, R: 24 sec    Time 8    Period Weeks    Status Partially Met    Target Date 12/09/20      PT LONG TERM GOAL #3   Title Pt to demonstrate backward walking gait speed ~ 0.34ms or greater to show improved facility in ankle motor control in gait.    Baseline 6/13: 0.67 m/s; 7/18 0.7 m/s    Time 8    Period Weeks    Status Partially Met    Target Date 12/09/20      PT LONG TERM GOAL #4   Title Pt will demonstrate safe and appropriate AMB on unlevel, uneven, and soft surfaces to show independent and safe ability to access beach area ad lib.    Baseline low confidence, concerns of safety at eval; 7/18: pt felt fairly confident amb. ad lib over uneven and soft surface. Reports overal improvement with ability to ambulate on beach. Only shows two instances of decreased stability and step strategy to regain balance; 7/25: pt demos ability to amb. on uneven, unlevel surfaces without LOB, however, reports difficulty in dim lighting    Time 8    Period Weeks    Status Partially Met    Target Date 12/09/20                   Plan - 11/27/20 1512     Clinical Impression Statement FOTO reassessed as pt will be away from clinic for a few weeks. Score today was 77, slight decrease from prior assessment. Pt does feel balance has improved, but still feels challenged on uneven surfaces. Pt is most challenged with compliant surface training in session. However, she did show some within-session improvement in postural stability with reps on airex pad. The pt will continue to benefit from further skilled PT to improve balance and decrease fall risk.    Personal Factors  and Comorbidities Past/Current Experience;Time since onset of injury/illness/exacerbation    Examination-Activity Limitations Locomotion Level;Stairs;Squat    Examination-Participation Restrictions Cleaning;Community Activity;Yard Work;Other   going to [employer] Stable/Uncomplicated    Rehab Potential Excellent    PT Frequency 2x / week    PT Duration 8 weeks    PT Treatment/Interventions Balance training;Functional mobility training;Therapeutic activities;Therapeutic exercise;Neuromuscular re-education;Patient/family education;Gait training;Stair training;Ultrasound;Canalith Repostioning;Electrical Stimulation;DME Instruction    PT Next Visit Plan balance on incline, decline, and banked surfaces; assess AMB in grass, ankle inv/ev measurement, balance on compliant surfaces, dynamic balance, balance activities that require increased use of vestibular cues, tasks with EC or in dimly lit areas, continue POC    PT Home Exercise Plan SLS 10x5secH bilat, standing all-toes dorsiflexion 15x3secH;   ?Access Code: [email or web address] no updates on this date; 7/18: reissued handout of SLS, tandem stance and slow marches for HEP with instruction to perform at support surface with chair behind her and to not perform if feeling increasingly unsteady. Pt verbalized understanding. no updates on this date, no updates this date    Consulted and Agree with Plan of Care Patient             Patient will benefit from skilled therapeutic intervention in order to improve the following deficits and impairments:  Decreased strength, Difficulty walking, Decreased balance, Decreased coordination, Decreased activity tolerance, Decreased knowledge of  use of DME, Postural dysfunction, Improper body mechanics  Visit Diagnosis: Unsteadiness on feet  Other abnormalities of gait and mobility  Other lack of coordination     Problem List Patient Active Problem List   Diagnosis Date Noted    Cataract cortical, senile 08/03/2017   Chronic colitis 08/03/2017   Medicare annual wellness visit, initial 12/15/2016   Benign essential hypertension 11/04/2016   OA (osteoarthritis) of knee 02/12/2016   Hyperlipidemia, mixed 10/24/2015   Clostridium difficile colitis 01/02/2014   Right bundle branch block 10/18/2013   Ricard Dillon PT, DPT  11/27/2020, 3:15 PM  [place] MAIN [facility] SERVICES [place], [place], [address] Phone: [phone]   Fax:  [phone]  Name: Kiara [name] MRN: [id number] Date of Birth: 08/10/1934

## 2020-12-04 ENCOUNTER — Ambulatory Visit: Payer: Medicare PPO

## 2020-12-05 ENCOUNTER — Ambulatory Visit: Payer: Medicare PPO | Attending: Neurology

## 2020-12-05 DIAGNOSIS — M6281 Muscle weakness (generalized): Secondary | ICD-10-CM | POA: Insufficient documentation

## 2020-12-05 DIAGNOSIS — R278 Other lack of coordination: Secondary | ICD-10-CM | POA: Insufficient documentation

## 2020-12-05 DIAGNOSIS — R2689 Other abnormalities of gait and mobility: Secondary | ICD-10-CM | POA: Insufficient documentation

## 2020-12-05 DIAGNOSIS — R262 Difficulty in walking, not elsewhere classified: Secondary | ICD-10-CM | POA: Insufficient documentation

## 2020-12-05 DIAGNOSIS — R2681 Unsteadiness on feet: Secondary | ICD-10-CM | POA: Insufficient documentation

## 2020-12-11 ENCOUNTER — Ambulatory Visit: Payer: Medicare PPO | Admitting: Physical Therapy

## 2020-12-13 ENCOUNTER — Other Ambulatory Visit: Payer: Self-pay

## 2020-12-13 ENCOUNTER — Ambulatory Visit: Payer: Medicare PPO | Admitting: Physical Therapy

## 2020-12-13 DIAGNOSIS — R262 Difficulty in walking, not elsewhere classified: Secondary | ICD-10-CM

## 2020-12-13 DIAGNOSIS — R2681 Unsteadiness on feet: Secondary | ICD-10-CM

## 2020-12-13 DIAGNOSIS — R278 Other lack of coordination: Secondary | ICD-10-CM

## 2020-12-13 DIAGNOSIS — M6281 Muscle weakness (generalized): Secondary | ICD-10-CM | POA: Diagnosis present

## 2020-12-13 DIAGNOSIS — R2689 Other abnormalities of gait and mobility: Secondary | ICD-10-CM

## 2020-12-13 NOTE — Therapy (Signed)
[place] MAIN [facility] SERVICES [address] [place], [place], [address] Phone: [phone]   Fax:  [phone]  Physical Therapy Treatment  Patient Details  Name: Kiara [name] MRN: [id number] Date of Birth: 1935-01-17 Referring Provider (PT): Emily Filbert MD (PCP)   Encounter Date: 12/13/2020   PT End of Session - 12/13/20 0924     Visit Number 18    Number of Visits 32    Date for PT Re-Evaluation 12/09/20    Authorization Type [employer] Choice PPO: authorized for 16 visits 09/09/20-11/27/20    Authorization Time Period Cert 1/74/94-07/06/65; Cert 5/91/63-8/46/65, cert/ Josem Kaufmann 9/93-57/0    Authorization - Visit Number 15    Authorization - Number of Visits 16    Progress Note Due on Visit 20    PT Start Time 0803    PT Stop Time 0845    PT Time Calculation (min) 42 min    Equipment Utilized During Treatment Gait belt    Activity Tolerance Patient tolerated treatment well    Behavior During Therapy WFL for tasks assessed/performed             Past Medical History:  Diagnosis Date   Arthritis    osteoarthritis -right knee.   C. difficile colitis    past,no recent issues   Colitis    Diverticulitis    Fall    "tripped"-10 days ago-scrapped right knee and lateral right brow area-" healing"   GERD (gastroesophageal reflux disease)    occ.   Seasonal allergies     Past Surgical History:  Procedure Laterality Date   APPENDECTOMY     CATARACT EXTRACTION, BILATERAL     EYE SURGERY     ? right eye retina repair.   HEMORRHOID SURGERY     KNEE ARTHROSCOPY Right 11/26/2014   Procedure: ARTHROSCOPY KNEE, Tear medical horn and anterior horn. Lateral mesicus tear, grade 3 medial;  Surgeon: Dereck Leep, MD;  Location: [facility] ORS;  Service: Orthopedics;  Laterality: Right;   MOLE REMOVAL     benign   TONSILLECTOMY     TOTAL KNEE ARTHROPLASTY Right 02/12/2016   Procedure: RIGHT TOTAL KNEE ARTHROPLASTY;  Surgeon: Gaynelle Arabian,  MD;  Location: [facility];  Service: Orthopedics;  Laterality: Right;    There were no vitals filed for this visit.   Treatment provided this session  Neuro re-ed  7:30  ambulate across stable and unstable surface outside. Negotiating changing surfaces from grass to sidewalk, across brick, in gravel and across rocks with turns and obstacles in pathway without LOB. -challenged pt to walk with head turns, and in and out of grass with weaving between tables.  Most challenge was with ambulation and [place] due to variation of terrain with each step    The following activities were completed in parallel bars or at balance bar    Tandem balance with head turns  3 x 30 sec with each lower extremity posterior 2 sets of lateral HT and 1 set vertical head nods  -Patient instructed to add this to her home exercise program and she verbalized understanding, physical therapist offered to make print out for patient declined and stated she would be able to add this to her program without need for additional handout.    On Airex:    EC, NBOS-4x30 sec, still very challenging with multiple LOB that requires up to min a to regain balance. Improves with reps.  Patient notes with posterior loss of balance patient has  difficulty recognizing with cues to lean forward when leaning back patient demonstrated improved ability to complete activity  Standing on 1/2 foam roller 3 x 30 sec  -Initially patient was placed in max dorsiflexion patient had difficulty with shifting weight forward due to endrange dorsiflexion this was altered by placing patient's feet and left dorsi flexion patient was better able to tolerate activity still rated as challenging in order to keep balance without significant trunk lean.  Korebalance for training of anticipatory postural control, limits of stabilty/weight-shifts, ankle strategies --- 2x varying Maze, decreasing UE supports as needed . CGA throughout.  Patient had most difficulty  with posterior to anterior weight shifting  Unless otherwise stated, CGA was provided and gait belt donned in order to ensure pt safety  Note: Portions of this document were prepared using Dragon voice recognition software and although reviewed may contain unintentional dictation errors in syntax, grammar, or spelling.                             PT Education - 12/13/20 1019     Education Details exercise technique, HEP mod for head turns    Methods Explanation;Demonstration;Verbal cues    Comprehension Verbalized understanding;Returned demonstration              PT Short Term Goals - 10/21/20 1503       PT SHORT TERM GOAL #1   Title After 4 weeks pt will demonstrate SLS balance >30sec bilat.    Baseline Eval: ~3 sec bilat; 7/19: 48 L, 24 sec R    Time 4    Period Weeks    Status Partially Met    Target Date 11/11/20      PT SHORT TERM GOAL #2   Title Pt to demonstrate backwards walking gait speed >0.12ms to reveal improved sagittal plane motor control in bilat ankles.    Baseline 6/13: 0.67 m/s, 7/18: 0.7 m/s    Time 4    Period Weeks    Status Partially Met    Target Date 11/11/20      PT SHORT TERM GOAL #3   Title Pt to demonstrate ability to AMB in thick grass at modI level >2045fwith LRAD and without LOB.    Baseline difficulty with uneven surfaces, avoids at eval: 7/18: deferred to next appointment; 7/25: pt ambulates over 200 ft in thick grass w/o LOB and no AD    Time 4    Period Weeks    Status Achieved    Target Date 11/11/20               PT Long Term Goals - 10/21/20 1504       PT LONG TERM GOAL #1   Title Pt will score 81 or greater on FOTO survey to demonstrate a sustained level of activity tolerance.    Baseline 7/18: unable to assess this session d/t server being down; 7/25: 82    Time 8    Period Weeks    Status Achieved      PT LONG TERM GOAL #2   Title Pt will demonstrate SLS balance >45 seconds shod to shoe  improved ankle/trunk motorcontrol.    Baseline ~3 sec at eval; 7/18: L 48 sec, R: 24 sec    Time 8    Period Weeks    Status Partially Met    Target Date 12/09/20      PT LONG TERM GOAL #3  Title Pt to demonstrate backward walking gait speed ~ 0.9ms or greater to show improved facility in ankle motor control in gait.    Baseline 6/13: 0.67 m/s; 7/18 0.7 m/s    Time 8    Period Weeks    Status Partially Met    Target Date 12/09/20      PT LONG TERM GOAL #4   Title Pt will demonstrate safe and appropriate AMB on unlevel, uneven, and soft surfaces to show independent and safe ability to access beach area ad lib.    Baseline low confidence, concerns of safety at eval; 7/18: pt felt fairly confident amb. ad lib over uneven and soft surface. Reports overal improvement with ability to ambulate on beach. Only shows two instances of decreased stability and step strategy to regain balance; 7/25: pt demos ability to amb. on uneven, unlevel surfaces without LOB, however, reports difficulty in dim lighting    Time 8    Period Weeks    Status Partially Met    Target Date 12/09/20                   Plan - 12/13/20 0932     Clinical Impression Statement Began session today with patient ambulating outside on various surfaces.  Patient reported over the weekend her balance felt impaired when she was walking in grass.  Patient demonstrated good ability to ambulate and grass without distraction but with head turns looking up and down and side to side patient demonstrated decreased speed and altered gait mechanics.  Altered HEP in order to incorporate some head turns with balance activities and patient verbalized understanding and returned demonstration.  Patient still challenged when her vision is altered either with eyes closed or with head turns.  Patient has 1 more physical therapy session scheduled and will benefit from review of HEP incorporation of any  New exercises as physical therapist  feels are necessary.    Personal Factors and Comorbidities Past/Current Experience;Time since onset of injury/illness/exacerbation    Examination-Activity Limitations Locomotion Level;Stairs;Squat    Examination-Participation Restrictions Cleaning;Community Activity;Yard Work;Other    Stability/Clinical Decision Making Stable/Uncomplicated    Rehab Potential Excellent    PT Frequency 2x / week    PT Duration 8 weeks    PT Treatment/Interventions Balance training;Functional mobility training;Therapeutic activities;Therapeutic exercise;Neuromuscular re-education;Patient/family education;Gait training;Stair training;Ultrasound;Canalith Repostioning;Electrical Stimulation;DME Instruction    PT Next Visit Plan balance on incline, decline, and banked surfaces; assess AMB in grass, ankle inv/ev measurement, balance on compliant surfaces, dynamic balance, balance activities that require increased use of vestibular cues, tasks with EC or in dimly lit areas, continue POC    PT Home Exercise Plan Access Code: [email or web address] no updates on this date; 7/18: reissued handout of SLS, tandem stance and slow marches for HEP with instruction to perform at support surface with chair behind her and to not perform if feeling increasingly unsteady. Pt verbalized understanding.  Updated tandem stance to progression from standard tandem stance to tandem stance with lateral and vertical head turns    Consulted and Agree with Plan of Care Patient             Patient will benefit from skilled therapeutic intervention in order to improve the following deficits and impairments:  Decreased strength, Difficulty walking, Decreased balance, Decreased coordination, Decreased activity tolerance, Decreased knowledge of use of DME, Postural dysfunction, Improper body mechanics  Visit Diagnosis: Unsteadiness on feet  Other abnormalities of gait and mobility  Other lack of coordination  Muscle  weakness (generalized)  Difficulty in  walking, not elsewhere classified     Problem List Patient Active Problem List   Diagnosis Date Noted   Cataract cortical, senile 08/03/2017   Chronic colitis 08/03/2017   Medicare annual wellness visit, initial 12/15/2016   Benign essential hypertension 11/04/2016   OA (osteoarthritis) of knee 02/12/2016   Hyperlipidemia, mixed 10/24/2015   Clostridium difficile colitis 01/02/2014   Right bundle branch block 10/18/2013    Particia Lather, PT 12/13/2020, 10:25 AM  [place] MAIN [facility] SERVICES [address] [place], [place], [address] Phone: [phone]   Fax:  [phone]  Name: Kiara [name] MRN: [id number] Date of Birth: [birth year]

## 2020-12-16 ENCOUNTER — Ambulatory Visit: Payer: Medicare PPO

## 2020-12-25 ENCOUNTER — Ambulatory Visit: Payer: Medicare PPO | Admitting: Physical Therapy

## 2020-12-26 ENCOUNTER — Ambulatory Visit: Payer: Medicare PPO | Admitting: Physical Therapy

## 2020-12-26 ENCOUNTER — Other Ambulatory Visit: Payer: Self-pay

## 2020-12-26 DIAGNOSIS — R2681 Unsteadiness on feet: Secondary | ICD-10-CM

## 2020-12-26 DIAGNOSIS — R2689 Other abnormalities of gait and mobility: Secondary | ICD-10-CM

## 2020-12-26 DIAGNOSIS — R278 Other lack of coordination: Secondary | ICD-10-CM

## 2020-12-26 DIAGNOSIS — R262 Difficulty in walking, not elsewhere classified: Secondary | ICD-10-CM

## 2020-12-26 DIAGNOSIS — M6281 Muscle weakness (generalized): Secondary | ICD-10-CM

## 2020-12-26 NOTE — Therapy (Signed)
[place] MAIN [facility] SERVICES [address] [place], [place], [address] Phone: [phone]   Fax:  [phone]  Physical Therapy Treatment  Patient Details  Name: Kiara [name] MRN: [id number] Date of Birth: [birth year] Referring Provider (PT): Emily Filbert MD (PCP)   Encounter Date: 12/26/2020   PT End of Session - 12/26/20 1402     Visit Number 19    Number of Visits 32    Date for PT Re-Evaluation 12/09/20    Authorization Type [employer] Choice PPO: authorized for 16 visits 09/09/20-11/27/20    Authorization Time Period Cert 4/40/10-05/07/23; Cert 3/66/44-0/34/74, cert/ auth [id number]/59-56/3    Authorization - Visit Number 16    Authorization - Number of Visits 16    Progress Note Due on Visit 20    PT Start Time 1105    PT Stop Time 1145    PT Time Calculation (min) 40 min    Equipment Utilized During Treatment Gait belt    Activity Tolerance Patient tolerated treatment well    Behavior During Therapy WFL for tasks assessed/performed             Past Medical History:  Diagnosis Date   Arthritis    osteoarthritis -right knee.   C. difficile colitis    past,no recent issues   Colitis    Diverticulitis    Fall    "tripped"-10 days ago-scrapped right knee and lateral right brow area-" healing"   GERD (gastroesophageal reflux disease)    occ.   Seasonal allergies     Past Surgical History:  Procedure Laterality Date   APPENDECTOMY     CATARACT EXTRACTION, BILATERAL     EYE SURGERY     ? right eye retina repair.   HEMORRHOID SURGERY     KNEE ARTHROSCOPY Right 11/26/2014   Procedure: ARTHROSCOPY KNEE, Tear medical horn and anterior horn. Lateral mesicus tear, grade 3 medial;  Surgeon: Dereck Leep, MD;  Location: [facility] ORS;  Service: Orthopedics;  Laterality: Right;   MOLE REMOVAL     benign   TONSILLECTOMY     TOTAL KNEE ARTHROPLASTY Right 02/12/2016   Procedure: RIGHT TOTAL KNEE ARTHROPLASTY;  Surgeon: Gaynelle Arabian,  MD;  Location: [facility];  Service: Orthopedics;  Laterality: Right;    There were no vitals filed for this visit.   Subjective Assessment - 12/26/20 1359     Subjective Pt reports improved ability to perform walking tasks on beach when she is able. Has  beeen very busy lately as her DTR has been in and out of the hospital.    Pertinent History [name] is an 50yoF who comes to [place] referred by PCP for imbalance in walking. 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 Pt also had an acute onset transient episode of AMB 1 month prior wherein she could only AMB in a circle, but not in a straight line. This occurred while out walking a dog with friend, ultimately sustained a fall onto Rt hand and Rt hip, no injury sustained. Pt goes to her beach house each month and recently has felt less safe AMB on soft sand, also no longer attempts to access water alone due to imbalance.    How long can you sit comfortably? not related to this episode    How long can you stand comfortably? not related to this episode    How long can you walk  comfortably? not related to this episode    Patient Stated Goals Feel more confident in walking at the beach and be able to safely access the ocean.    Currently in Pain? Yes    Pain Location Knee    Pain Orientation Left             Treatment provided this session Pt was evaluated for potential discharge I todays session, however, as demonstrated by values in goals section and in the treatment notes, pt showed signs of balance regression since previous progress note. Pt will continue to benefit from therpay to further improve balance.     Neuro Re- Ed:   Single-leg balance practice.  7 trials on each lower extremity.  Patient best performance on right lower extremity was 12 seconds in best performance on left lower extremity with 4 seconds.  To significant difference from  previous progress notes from the skull was assessed.  Patient mentions it could be due to going on long walk this morning for breast cancer awareness and some muscular fatigue.  Retrowalking.  X10 m.  Patient performed at decreased pace compared to previous progress note.  See goals section for ambulation speed.  Step up to Airex pad without upper extremity assistance 10 times each lower extremity.  Patient was hesitant on first attempt with increased repetitions patient demonstrated improved ability to perform exercise.  Narrow base of support standing on Airex pad with lateral head turns for 1 minute and superior and inferior ninths of the head for 1 minute.  No loss of balance noted but patient had to use ankle strategies in order to prevent loss of balance.       There Act ambulate across stable and unstable surface outside. Negotiating changing surfaces from grass to sidewalk, across brick with turns and obstacles in pathway without LOB. -Patient able to ambulate on various surfaces including sidewalk pine straw and grass with some decrease in speed and patient reports some hesitation but patient has good balance and good reactive responses when completing.  Standing march with minimal upper extremity support.  Patient has difficulty with holding lower extremity up and maintaining single-leg balance.  Performed 20 times on each lower extremity and patient did not improve and efficacy as we went on.   Unless otherwise stated, SBA was provided and gait belt donned in order to ensure pt safety  Pt educated throughout session about proper posture and technique with exercises. Improved exercise technique, movement at target joints, use of target muscles after min to mod verbal, visual, tactile cues.                            PT Education - 12/26/20 1401     Education Details Pt educated in regard to further needs for physical therapy interventions    Person(s)  Educated Patient    Methods Explanation;Demonstration;Verbal cues    Comprehension Verbalized understanding;Returned demonstration              PT Short Term Goals - 12/26/20 1113       PT SHORT TERM GOAL #1   Title After 4 weeks pt will demonstrate SLS balance >30sec bilat.    Baseline Eval: ~3 sec bilat; 7/19: 48 L, 24 sec R, 9/29: 12 sec R 4 sec L,    Time 4    Period Weeks    Status Partially Met    Target Date 11/11/20  PT SHORT TERM GOAL #2   Title Pt to demonstrate backwards walking gait speed >0.378ms to reveal improved sagittal plane motor control in bilat ankles.    Baseline 6/13: 0.67 m/s, 7/18: 0.7 m/s, 12/26/20: .51 m/s    Time 4    Period Weeks    Status Partially Met    Target Date 11/11/20      PT SHORT TERM GOAL #3   Title Pt to demonstrate ability to AMB in thick grass at modI level >2086fwith LRAD and without LOB.    Baseline difficulty with uneven surfaces, avoids at eval: 7/18: deferred to next appointment; 7/25: pt ambulates over 200 ft in thick grass w/o LOB and no AD    Time 4    Period Weeks    Status Achieved    Target Date 11/11/20               PT Long Term Goals - 12/26/20 1135       PT LONG TERM GOAL #1   Title Pt will score 81 or greater on FOTO survey to demonstrate a sustained level of activity tolerance.    Baseline 7/18: unable to assess this session d/t server being down; 7/25: 82, 9/29:82.5    Time 8    Period Weeks    Status Achieved      PT LONG TERM GOAL #2   Title Pt will demonstrate SLS balance >45 seconds shod to shoe improved ankle/trunk motorcontrol.    Baseline ~3 sec at eval; 7/18: L 48 sec, R: 24 sec    Time 8    Period Weeks    Status Partially Met      PT LONG TERM GOAL #3   Title Pt to demonstrate backward walking gait speed ~ 0.78m38mor greater to show improved facility in ankle motor control in gait.    Baseline 6/13: 0.67 m/s; 7/18 0.7 m/s    Time 8    Period Weeks    Status Partially Met       PT LONG TERM GOAL #4   Title Pt will demonstrate safe and appropriate AMB on unlevel, uneven, and soft surfaces to show independent and safe ability to access beach area ad lib.    Baseline low confidence, concerns of safety at eval; 7/18: pt felt fairly confident amb. ad lib over uneven and soft surface. Reports overal improvement with ability to ambulate on beach. Only shows two instances of decreased stability and step strategy to regain balance; 7/25: pt demos ability to amb. on uneven, unlevel surfaces without LOB, however, reports difficulty in dim lighting    Time 8    Period Weeks    Status Achieved                   Plan - 12/26/20 1404     Clinical Impression Statement Today in physical therapy session physical therapist assess patient goals.  Patient demonstrated some progression toward some of her balance related goals including progression of single-leg stance time as well as regression and retroambulation speed.  Patient does report coming to therapy following 3 mile cancer awareness walker earlier this morning which may be contributing to some of her progression and performance.  Patient does report improved ability to ambulate on various surfaces outside still reports instability when performing these activities at night particularly when taking the dog out at night and performing turns.  Patient reports intermittent loss of balance during activity such as this.  Patient will  continue to benefit from skilled physical therapy intervention in order to improve her ability to complete daily activities such as taking the dog out at night, and improve her lower extremity strength and balance, and improve her overall quality of life.    Personal Factors and Comorbidities Past/Current Experience;Time since onset of injury/illness/exacerbation    Examination-Activity Limitations Locomotion Level;Stairs;Squat    Examination-Participation Restrictions Cleaning;Community Activity;Yard  Work;Other    Stability/Clinical Decision Making Stable/Uncomplicated    Rehab Potential Excellent    PT Frequency 2x / week    PT Duration 8 weeks    PT Treatment/Interventions Balance training;Functional mobility training;Therapeutic activities;Therapeutic exercise;Neuromuscular re-education;Patient/family education;Gait training;Stair training;Ultrasound;Canalith Repostioning;Electrical Stimulation;DME Instruction    PT Next Visit Plan balance on incline, decline, and banked surfaces; assess AMB in grass, ankle inv/ev measurement, balance on compliant surfaces, dynamic balance, balance activities that require increased use of vestibular cues, tasks with EC or in dimly lit areas, continue POC    PT Home Exercise Plan Access Code: [email or web address]; no updates on this date; 7/18: reissued handout of SLS, tandem stance and slow marches for HEP with instruction to perform at support surface with chair behind her and to not perform if feeling increasingly unsteady. Pt verbalized understanding.  Updated tandem stance to progression from standard tandem stance to tandem stance with lateral and vertical head turns    Consulted and Agree with Plan of Care Patient             Patient will benefit from skilled therapeutic intervention in order to improve the following deficits and impairments:  Decreased strength, Difficulty walking, Decreased balance, Decreased coordination, Decreased activity tolerance, Decreased knowledge of use of DME, Postural dysfunction, Improper body mechanics  Visit Diagnosis: Unsteadiness on feet  Other abnormalities of gait and mobility  Other lack of coordination  Muscle weakness (generalized)  Difficulty in walking, not elsewhere classified     Problem List Patient Active Problem List   Diagnosis Date Noted   Cataract cortical, senile 08/03/2017   Chronic colitis 08/03/2017   Medicare annual wellness visit, initial 12/15/2016   Benign essential hypertension  11/04/2016   OA (osteoarthritis) of knee 02/12/2016   Hyperlipidemia, mixed 10/24/2015   Clostridium difficile colitis 01/02/2014   Right bundle branch block 10/18/2013    Particia Lather, PT 12/26/2020, 2:11 PM  [place] MAIN [facility] SERVICES [address] [place], [place], [address] Phone: [phone]   Fax:  [phone]  Name: AREEBAH MEINDERS MRN: [id number] Date of Birth: 01-25-1935

## 2021-01-06 ENCOUNTER — Ambulatory Visit: Payer: Medicare PPO | Attending: Neurology

## 2021-01-06 ENCOUNTER — Other Ambulatory Visit: Payer: Self-pay

## 2021-01-06 DIAGNOSIS — R269 Unspecified abnormalities of gait and mobility: Secondary | ICD-10-CM | POA: Diagnosis present

## 2021-01-06 DIAGNOSIS — R2681 Unsteadiness on feet: Secondary | ICD-10-CM | POA: Insufficient documentation

## 2021-01-06 DIAGNOSIS — R262 Difficulty in walking, not elsewhere classified: Secondary | ICD-10-CM | POA: Insufficient documentation

## 2021-01-06 DIAGNOSIS — R2689 Other abnormalities of gait and mobility: Secondary | ICD-10-CM | POA: Insufficient documentation

## 2021-01-06 DIAGNOSIS — R278 Other lack of coordination: Secondary | ICD-10-CM | POA: Insufficient documentation

## 2021-01-06 DIAGNOSIS — M6281 Muscle weakness (generalized): Secondary | ICD-10-CM | POA: Diagnosis present

## 2021-01-06 NOTE — Therapy (Signed)
[place] MAIN [facility] SERVICES [address] [place], [place], [address] Phone: [phone]   Fax:  [phone]  Physical Therapy Treatment/PROGRESS NOTE VISIT 20  Patient Details  Name: Kiara [name] MRN: [id number] Date of Birth: 04-Dec-1934 Referring Provider (PT): Emily Filbert MD (PCP)   Encounter Date: 01/06/2021   PT End of Session - 01/06/21 1650     Visit Number 20    Number of Visits 32    Date for PT Re-Evaluation 02/20/21    Authorization Type [employer] Choice PPO: authorized for 16 visits 09/09/20-11/27/20    Authorization Time Period Cert 3/57/01-10/04/91; Cert 12/01/98-12/20/28, cert/ auth [id number]    Authorization - Visit Number 16    Authorization - Number of Visits 16    Progress Note Due on Visit 20    PT Start Time 3335    PT Stop Time 1646    PT Time Calculation (min) 31 min    Equipment Utilized During Treatment Gait belt    Activity Tolerance Patient tolerated treatment well    Behavior During Therapy WFL for tasks assessed/performed             Past Medical History:  Diagnosis Date   Arthritis    osteoarthritis -right knee.   C. difficile colitis    past,no recent issues   Colitis    Diverticulitis    Fall    "tripped"-10 days ago-scrapped right knee and lateral right brow area-" healing"   GERD (gastroesophageal reflux disease)    occ.   Seasonal allergies     Past Surgical History:  Procedure Laterality Date   APPENDECTOMY     CATARACT EXTRACTION, BILATERAL     EYE SURGERY     ? right eye retina repair.   HEMORRHOID SURGERY     KNEE ARTHROSCOPY Right 11/26/2014   Procedure: ARTHROSCOPY KNEE, Tear medical horn and anterior horn. Lateral mesicus tear, grade 3 medial;  Surgeon: Dereck Leep, MD;  Location: [facility] ORS;  Service: Orthopedics;  Laterality: Right;   MOLE REMOVAL     benign   TONSILLECTOMY     TOTAL KNEE ARTHROPLASTY Right 02/12/2016   Procedure: RIGHT TOTAL KNEE ARTHROPLASTY;   Surgeon: Gaynelle Arabian, MD;  Location: [facility];  Service: Orthopedics;  Laterality: Right;    There were no vitals filed for this visit.   Subjective Assessment - 01/06/21 1616     Subjective Pt reports she fell on Monday. She reports she was out with her dog and she was looking down at her dog and took a step and lost her balance. She reports she did not hurt herself.    Pertinent History Kiara [name] is an 62yoF who comes to [place] referred by PCP for imbalance in walking. 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 Pt also had an acute onset transient episode of AMB 1 month prior wherein she could only AMB in a circle, but not in a straight line. This occurred while out walking a dog with friend, ultimately sustained a fall onto Rt hand and Rt hip, no injury sustained. Pt goes to her beach house each month and recently has felt less safe AMB on soft sand, also no longer attempts to access water alone due to imbalance.    How long can you sit comfortably? not related to this episode    How long can you stand comfortably? not related to this  episode    How long can you walk comfortably? not related to this episode    Patient Stated Goals Feel more confident in walking at the beach and be able to safely access the ocean.    Currently in Pain? No/denies            TREATMENT -   FOTO 78   Neuro Re-ed- CGA-min assist throughout   At support surface-   SLB - 4x30 sec BLEs; rates medium. Requires at least 2 finger support on bar.  NBOS EC, firm surface 2x60 sec; increased sway  NBOS, EC, firm surface, with vertical and horizontal head turns in 30 second bouts x 2 rounds. Increased sway.  Tandem stance 2x30 sec each LE; intermittent UE support. More difficulty with LLE as primary stance leg.   SLB progression with soccer ball - forward/backward, side-to-side, CW/CC - 15-30 sec bouts for each, each LE.  Intermittent UE support. Pt reports exercise as fatiguing.   On airex: EO - 30 sec EO with cog dual task - some increased sway NBOS EO with vertical, horizontal head turns - 10x each direction for each --progressed to with dual task. Somewhat challenging. EC, NBOS - 3x60 sec.  --progressed to dual task, one instance of LOB requring min a from PT and UE support on bar to regain balance. Increased sway.    Ambulating with horizontal head turns - 4x --progressed to with dual task. Exhibits decreased postural stability. CGA provided throughout.   Pt educated throughout session about proper posture and technique with exercises. Improved exercise technique, movement at target joints, use of target muscles after min to mod verbal, visual, tactile cues.      PT Education - 01/06/21 1650     Education Details exercise technique, body mechanics    Person(s) Educated Patient    Methods Explanation;Demonstration;Verbal cues    Comprehension Verbalized understanding;Returned demonstration;Need further instruction              PT Short Term Goals - 01/06/21 1656       PT SHORT TERM GOAL #1   Title After 4 weeks pt will demonstrate SLS balance >30sec bilat.    Baseline Eval: ~3 sec bilat; 7/19: 48 L, 24 sec R, 9/29: 12 sec R 4 sec L,    Time 4    Period Weeks    Status Partially Met    Target Date 11/11/20      PT SHORT TERM GOAL #2   Title Pt to demonstrate backwards walking gait speed >0.71ms to reveal improved sagittal plane motor control in bilat ankles.    Baseline 6/13: 0.67 m/s, 7/18: 0.7 m/s, 12/26/20: .51 m/s    Time 4    Period Weeks    Status Partially Met    Target Date 11/11/20      PT SHORT TERM GOAL #3   Title Pt to demonstrate ability to AMB in thick grass at modI level >2032fwith LRAD and without LOB.    Baseline difficulty with uneven surfaces, avoids at eval: 7/18: deferred to next appointment; 7/25: pt ambulates over 200 ft in thick grass w/o LOB and no AD     Time 4    Period Weeks    Status Achieved    Target Date 11/11/20               PT Long Term Goals - 01/06/21 1656       PT LONG TERM GOAL #1   Title Pt will score  81 or greater on FOTO survey to demonstrate a sustained level of activity tolerance.    Baseline 7/18: unable to assess this session d/t server being down; 7/25: 82, 9/29:82.5 10/10: 78    Time 8    Period Weeks    Status Achieved      PT LONG TERM GOAL #2   Title Pt will demonstrate SLS balance >45 seconds shod to shoe improved ankle/trunk motorcontrol.    Baseline ~3 sec at eval; 7/18: L 48 sec, R: 24 sec    Time 8    Period Weeks    Status Partially Met    Target Date 02/20/21      PT LONG TERM GOAL #3   Title Pt to demonstrate backward walking gait speed ~ 0.23ms or greater to show improved facility in ankle motor control in gait.    Baseline 6/13: 0.67 m/s; 7/18 0.7 m/s    Time 8    Period Weeks    Status Partially Met    Target Date 02/20/21      PT LONG TERM GOAL #4   Title Pt will demonstrate safe and appropriate AMB on unlevel, uneven, and soft surfaces to show independent and safe ability to access beach area ad lib.    Baseline low confidence, concerns of safety at eval; 7/18: pt felt fairly confident amb. ad lib over uneven and soft surface. Reports overal improvement with ability to ambulate on beach. Only shows two instances of decreased stability and step strategy to regain balance; 7/25: pt demos ability to amb. on uneven, unlevel surfaces without LOB, however, reports difficulty in dim lighting    Time 8    Period Weeks    Status Achieved                   Plan - 01/06/21 1652     Clinical Impression Statement PT session limited secondary to pt late arrival to appointment. FOTO reassessed for progress note, however, please refer to recent note from 9/29 for other goal assessment. FOTO score was 78, a slight decrease from prior intake of 83. Pt very challenged with SLB on this  date, requiring at least 2 finger support on bar. Pt did exhibit one instance of LOB with EC on compliant surface requiring UE support and min a from PT to regain balance. However pt steadied with reps. She requires CGA-min assist throughout. Patient's condition has the potential to improve in response to therapy. Maximum improvement is yet to be obtained. The anticipated improvement is attainable and reasonable in a generally predictable time. The pt will benefit from further skilled PT to improve balance in order to decrease fall risk.    Personal Factors and Comorbidities Past/Current Experience;Time since onset of injury/illness/exacerbation    Examination-Activity Limitations Locomotion Level;Stairs;Squat    Examination-Participation Restrictions Cleaning;Community Activity;Yard Work;Other    Stability/Clinical Decision Making Stable/Uncomplicated    Rehab Potential Excellent    PT Frequency 2x / week    PT Duration 8 weeks    PT Treatment/Interventions Balance training;Functional mobility training;Therapeutic activities;Therapeutic exercise;Neuromuscular re-education;Patient/family education;Gait training;Stair training;Ultrasound;Canalith Repostioning;Electrical Stimulation;DME Instruction    PT Next Visit Plan balance on incline, decline, and banked surfaces; assess AMB in grass, ankle inv/ev measurement, balance on compliant surfaces, dynamic balance, balance activities that require increased use of vestibular cues, tasks with EC or in dimly lit areas, continue POC    PT Home Exercise Plan Access Code: [email or web address] no updates on this date; 7/18: reissued handout of SLS, tandem  stance and slow marches for HEP with instruction to perform at support surface with chair behind her and to not perform if feeling increasingly unsteady. Pt verbalized understanding.  Updated tandem stance to progression from standard tandem stance to tandem stance with lateral and vertical head turns    Consulted and Agree  with Plan of Care Patient             Patient will benefit from skilled therapeutic intervention in order to improve the following deficits and impairments:  Decreased strength, Difficulty walking, Decreased balance, Decreased coordination, Decreased activity tolerance, Decreased knowledge of use of DME, Postural dysfunction, Improper body mechanics  Visit Diagnosis: Unsteadiness on feet  Other abnormalities of gait and mobility     Problem List Patient Active Problem List   Diagnosis Date Noted   Cataract cortical, senile 08/03/2017   Chronic colitis 08/03/2017   Medicare annual wellness visit, initial 12/15/2016   Benign essential hypertension 11/04/2016   OA (osteoarthritis) of knee 02/12/2016   Hyperlipidemia, mixed 10/24/2015   Clostridium difficile colitis 01/02/2014   Right bundle branch block 10/18/2013    Zollie Pee, PT 01/06/2021, 5:01 PM  [place] MAIN [facility] SERVICES [address] [place], [place], [address] Phone: [phone]   Fax:  [phone]  Name: Kiara [name] MRN: [id number] Date of Birth: 17-Jun-1934

## 2021-01-09 ENCOUNTER — Other Ambulatory Visit: Payer: Self-pay

## 2021-01-09 ENCOUNTER — Ambulatory Visit: Payer: Medicare PPO | Admitting: Physical Therapy

## 2021-01-09 DIAGNOSIS — R2681 Unsteadiness on feet: Secondary | ICD-10-CM | POA: Diagnosis not present

## 2021-01-09 DIAGNOSIS — R278 Other lack of coordination: Secondary | ICD-10-CM

## 2021-01-09 DIAGNOSIS — R262 Difficulty in walking, not elsewhere classified: Secondary | ICD-10-CM

## 2021-01-09 DIAGNOSIS — R2689 Other abnormalities of gait and mobility: Secondary | ICD-10-CM

## 2021-01-09 NOTE — Therapy (Signed)
[place] MAIN [facility] SERVICES [address] [place], [place], [address] Phone: [phone]   Fax:  [phone]  Physical Therapy Treatment  Patient Details  Name: Kiara [name] MRN: [id number] Date of Birth: October 16, 1934 Referring Provider (PT): Emily Filbert MD (PCP)   Encounter Date: 01/09/2021   PT End of Session - 01/09/21 1025     Visit Number 21    Number of Visits 32    Date for PT Re-Evaluation 02/20/21    Authorization Type [employer] Choice PPO: authorized for 16 visits 09/09/20-11/27/20    Authorization Time Period Cert 0/03/70-07/06/86; Cert 12/13/92-07/31/86, cert/ auth 8/28-00/3    Authorization - Visit Number --    Authorization - Number of Visits --    Progress Note Due on Visit 30    PT Start Time 1018    PT Stop Time 1100    PT Time Calculation (min) 42 min    Equipment Utilized During Treatment Gait belt    Activity Tolerance Patient tolerated treatment well    Behavior During Therapy WFL for tasks assessed/performed             Past Medical History:  Diagnosis Date   Arthritis    osteoarthritis -right knee.   C. difficile colitis    past,no recent issues   Colitis    Diverticulitis    Fall    "tripped"-10 days ago-scrapped right knee and lateral right brow area-" healing"   GERD (gastroesophageal reflux disease)    occ.   Seasonal allergies     Past Surgical History:  Procedure Laterality Date   APPENDECTOMY     CATARACT EXTRACTION, BILATERAL     EYE SURGERY     ? right eye retina repair.   HEMORRHOID SURGERY     KNEE ARTHROSCOPY Right 11/26/2014   Procedure: ARTHROSCOPY KNEE, Tear medical horn and anterior horn. Lateral mesicus tear, grade 3 medial;  Surgeon: Dereck Leep, MD;  Location: [facility] ORS;  Service: Orthopedics;  Laterality: Right;   MOLE REMOVAL     benign   TONSILLECTOMY     TOTAL KNEE ARTHROPLASTY Right 02/12/2016   Procedure: RIGHT TOTAL KNEE ARTHROPLASTY;  Surgeon: Gaynelle Arabian,  MD;  Location: [facility];  Service: Orthopedics;  Laterality: Right;    There were no vitals filed for this visit.   Subjective Assessment - 01/09/21 1023     Subjective Patient reports no falls or loss of balance since previous session.  Patient did further describe fall that occurred this past Monday when she was walking her dog.  Patient reports she made a last-minute change in direction but she is unsure how she fell.    Pertinent History Kiara [name] is an 58yoF who comes to [place] referred by PCP for imbalance in walking. 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 Pt also had an acute onset transient episode of AMB 1 month prior wherein she could only AMB in a circle, but not in a straight line. This occurred while out walking a dog with friend, ultimately sustained a fall onto Rt hand and Rt hip, no injury sustained. Pt goes to her beach house each month and recently has felt less safe AMB on soft sand, also no longer attempts to access water alone due to imbalance.    How long can you sit comfortably? not related to this episode    How long can you stand  comfortably? not related to this episode    How long can you walk comfortably? not related to this episode    Patient Stated Goals Feel more confident in walking at the beach and be able to safely access the ocean.    Currently in Pain? No/denies    Pain Orientation --               Treatment provided this session   Neuro Re- Ed:  Marching with 4-second holds in order to improve single-leg stance and stability x10 on each side.  Minimal upper extremity use in order to maintain balance  Airex step over and back 2 x 10 with ea LE  -With loss of balance patient encouraged to utilize stepping strategy to recover balance in comparison to utilizing upper extremities.  Patient was also instructed in realistic situation to utilize hands if needed to  prevent fall.  Airex sidestep x10 to each side -No loss of balance noted no utilization of upper extremity required.  Some cues required in order to ensure patient takes big enough step to fit feet on Airex with both sides.  Ambulation training with various tasks  -retro walking and forward walking transitions on command -head turns (lateral and inferior and superior on command)  -change in gait speed with head turns   Airex with normal stance  2x 30 sec eyes closed  -Moderate sway noted.  Inpatient center of mass just too far posteriorly patient tends to lose balance and is unable to recognize his balance without opening her eyes or utilizing her hands.  We will continue to work on this in future sessions  Airex stance with 90 degree turn and ball toss x10 to each direction. -No loss of balance noted.  Patient has some difficulty with foot placement but no difficulty with ball toss activity.  Rocker board oriented with rocker anterior to posterior 30 seconds with no head turns followed by 30 seconds with superior and inferior head nods.  Patient able to maintain balance fairly but with posterior loss of balance or when center of mass goes outside base of support patient requires upper extremity support to maintain.  Rocker board oriented with rocker lateral 30 seconds with no head turns followed by 30 seconds with lateral head turns.  Patient rated as moderate to high difficulty.       Unless otherwise stated, CGA was provided and gait belt donned in order to ensure pt safety   Pt educated throughout session about proper posture and technique with exercises. Improved exercise technique, movement at target joints, use of target muscles after min to mod verbal, visual, tactile cues.                          PT Education - 01/09/21 1347     Education Details Exercise technique, body mechanics    Person(s) Educated Patient    Methods  Explanation;Demonstration;Verbal cues    Comprehension Verbalized understanding;Returned demonstration;Need further instruction              PT Short Term Goals - 01/06/21 1656       PT SHORT TERM GOAL #1   Title After 4 weeks pt will demonstrate SLS balance >30sec bilat.    Baseline Eval: ~3 sec bilat; 7/19: 48 L, 24 sec R, 9/29: 12 sec R 4 sec L,    Time 4    Period Weeks    Status Partially Met    Target  Date 11/11/20      PT SHORT TERM GOAL #2   Title Pt to demonstrate backwards walking gait speed >0.12ms to reveal improved sagittal plane motor control in bilat ankles.    Baseline 6/13: 0.67 m/s, 7/18: 0.7 m/s, 12/26/20: .51 m/s    Time 4    Period Weeks    Status Partially Met    Target Date 11/11/20      PT SHORT TERM GOAL #3   Title Pt to demonstrate ability to AMB in thick grass at modI level >2094fwith LRAD and without LOB.    Baseline difficulty with uneven surfaces, avoids at eval: 7/18: deferred to next appointment; 7/25: pt ambulates over 200 ft in thick grass w/o LOB and no AD    Time 4    Period Weeks    Status Achieved    Target Date 11/11/20               PT Long Term Goals - 01/06/21 1656       PT LONG TERM GOAL #1   Title Pt will score 81 or greater on FOTO survey to demonstrate a sustained level of activity tolerance.    Baseline 7/18: unable to assess this session d/t server being down; 7/25: 82, 9/29:82.5 10/10: 78    Time 8    Period Weeks    Status Achieved      PT LONG TERM GOAL #2   Title Pt will demonstrate SLS balance >45 seconds shod to shoe improved ankle/trunk motorcontrol.    Baseline ~3 sec at eval; 7/18: L 48 sec, R: 24 sec    Time 8    Period Weeks    Status Partially Met    Target Date 02/20/21      PT LONG TERM GOAL #3   Title Pt to demonstrate backward walking gait speed ~ 0.47m30mor greater to show improved facility in ankle motor control in gait.    Baseline 6/13: 0.67 m/s; 7/18 0.7 m/s    Time 8    Period Weeks     Status Partially Met    Target Date 02/20/21      PT LONG TERM GOAL #4   Title Pt will demonstrate safe and appropriate AMB on unlevel, uneven, and soft surfaces to show independent and safe ability to access beach area ad lib.    Baseline low confidence, concerns of safety at eval; 7/18: pt felt fairly confident amb. ad lib over uneven and soft surface. Reports overal improvement with ability to ambulate on beach. Only shows two instances of decreased stability and step strategy to regain balance; 7/25: pt demos ability to amb. on uneven, unlevel surfaces without LOB, however, reports difficulty in dim lighting    Time 8    Period Weeks    Status Achieved                   Plan - 01/09/21 1041     Clinical Impression Statement Patient demonstrates good motivation to complete physical therapy exercises in today's session.  Patient continues to display impairments with balance and has increased difficulty with reactive stepping in case of loss of balance and has tendency to utilize hands to try to regain balance.  Patient demonstrates good ability to transition from forward to return ambulation as well as good good ability to make turns on various surfaces indicated by ability to perform ball toss and turns on Airex.  Patient has most difficulty with maintaining balance when  her vision is impaired such as with her eyes closed or with activities involving head turns.  His activities were continue to be targeted to be 2 sessions in order to improve patient's balance, ambulatory capacity, and decrease her risk of falls in the future.    Personal Factors and Comorbidities Past/Current Experience;Time since onset of injury/illness/exacerbation    Examination-Activity Limitations Locomotion Level;Stairs;Squat    Examination-Participation Restrictions Cleaning;Community Activity;Yard Work;Other    Stability/Clinical Decision Making Stable/Uncomplicated    Rehab Potential Excellent    PT  Frequency 2x / week    PT Duration 8 weeks    PT Treatment/Interventions Balance training;Functional mobility training;Therapeutic activities;Therapeutic exercise;Neuromuscular re-education;Patient/family education;Gait training;Stair training;Ultrasound;Canalith Repostioning;Electrical Stimulation;DME Instruction    PT Next Visit Plan balance on incline, decline, and banked surfaces; assess AMB in grass, ankle inv/ev measurement, balance on compliant surfaces, dynamic balance, balance activities that require increased use of vestibular cues, tasks with EC or in dimly lit areas, continue POC    PT Home Exercise Plan Access Code: [email or web address]; no updates on this date; 7/18: reissued handout of SLS, tandem stance and slow marches for HEP with instruction to perform at support surface with chair behind her and to not perform if feeling increasingly unsteady. Pt verbalized understanding.  Updated tandem stance to progression from standard tandem stance to tandem stance with lateral and vertical head turns    Consulted and Agree with Plan of Care Patient             Patient will benefit from skilled therapeutic intervention in order to improve the following deficits and impairments:  Decreased strength, Difficulty walking, Decreased balance, Decreased coordination, Decreased activity tolerance, Decreased knowledge of use of DME, Postural dysfunction, Improper body mechanics  Visit Diagnosis: Unsteadiness on feet  Other lack of coordination  Other abnormalities of gait and mobility  Difficulty in walking, not elsewhere classified     Problem List Patient Active Problem List   Diagnosis Date Noted   Cataract cortical, senile 08/03/2017   Chronic colitis 08/03/2017   Medicare annual wellness visit, initial 12/15/2016   Benign essential hypertension 11/04/2016   OA (osteoarthritis) of knee 02/12/2016   Hyperlipidemia, mixed 10/24/2015   Clostridium difficile colitis 01/02/2014   Right bundle  branch block 10/18/2013    Kiara [name], PT 01/09/2021, 2:03 PM  [place] [address] [place], [place], [address] Phone: [phone]   Fax:  [phone]  Name: Kiara [name] MRN: [id number] Date of Birth: [birth year]

## 2021-01-15 ENCOUNTER — Ambulatory Visit: Payer: Medicare PPO | Admitting: Physical Therapy

## 2021-01-17 ENCOUNTER — Ambulatory Visit: Payer: Medicare PPO | Admitting: Physical Therapy

## 2021-01-21 ENCOUNTER — Other Ambulatory Visit: Payer: Self-pay

## 2021-01-21 ENCOUNTER — Ambulatory Visit: Payer: Medicare PPO | Admitting: Physical Therapy

## 2021-01-21 DIAGNOSIS — R2681 Unsteadiness on feet: Secondary | ICD-10-CM

## 2021-01-21 DIAGNOSIS — M6281 Muscle weakness (generalized): Secondary | ICD-10-CM

## 2021-01-21 DIAGNOSIS — R262 Difficulty in walking, not elsewhere classified: Secondary | ICD-10-CM

## 2021-01-21 DIAGNOSIS — R278 Other lack of coordination: Secondary | ICD-10-CM

## 2021-01-21 DIAGNOSIS — R269 Unspecified abnormalities of gait and mobility: Secondary | ICD-10-CM

## 2021-01-21 DIAGNOSIS — R2689 Other abnormalities of gait and mobility: Secondary | ICD-10-CM

## 2021-01-21 NOTE — Therapy (Signed)
[place] MAIN [facility] SERVICES [address] [place], [place], [address] Phone: [phone]   Fax:  [phone]  Physical Therapy Treatment  Patient Details  Name: Kiara [name] MRN: [id number] Date of Birth: 15-Oct-1934 Referring Provider (PT): Emily Filbert MD (PCP)   Encounter Date: 01/21/2021   PT End of Session - 01/21/21 1611     Visit Number 22    Number of Visits 32    Date for PT Re-Evaluation 02/20/21    Authorization Type [employer] Choice PPO: authorized for 16 visits 09/09/20-11/27/20    Authorization Time Period Cert 8/34/19-08/29/20; Cert 9/79/89-05/10/92, cert/ auth 1/74-08/1    Progress Note Due on Visit 30    PT Start Time 1352    PT Stop Time 1430    PT Time Calculation (min) 38 min    Equipment Utilized During Treatment Gait belt    Activity Tolerance Patient tolerated treatment well    Behavior During Therapy WFL for tasks assessed/performed             Past Medical History:  Diagnosis Date   Arthritis    osteoarthritis -right knee.   C. difficile colitis    past,no recent issues   Colitis    Diverticulitis    Fall    "tripped"-10 days ago-scrapped right knee and lateral right brow area-" healing"   GERD (gastroesophageal reflux disease)    occ.   Seasonal allergies     Past Surgical History:  Procedure Laterality Date   APPENDECTOMY     CATARACT EXTRACTION, BILATERAL     EYE SURGERY     ? right eye retina repair.   HEMORRHOID SURGERY     KNEE ARTHROSCOPY Right 11/26/2014   Procedure: ARTHROSCOPY KNEE, Tear medical horn and anterior horn. Lateral mesicus tear, grade 3 medial;  Surgeon: Dereck Leep, MD;  Location: [facility] ORS;  Service: Orthopedics;  Laterality: Right;   MOLE REMOVAL     benign   TONSILLECTOMY     TOTAL KNEE ARTHROPLASTY Right 02/12/2016   Procedure: RIGHT TOTAL KNEE ARTHROPLASTY;  Surgeon: Gaynelle Arabian, MD;  Location: [facility];  Service: Orthopedics;  Laterality: Right;    There  were no vitals filed for this visit.   Subjective Assessment - 01/21/21 1355     Subjective Patient reports no falls or loss of balance since previous session. Denies pain. No questions or concerns at this time.    Pertinent History Kiara [name] is an 66yoF who comes to [place] referred by PCP for imbalance in walking. 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 Pt also had an acute onset transient episode of AMB 1 month prior wherein she could only AMB in a circle, but not in a straight line. This occurred while out walking a dog with friend, ultimately sustained a fall onto Rt hand and Rt hip, no injury sustained. Pt goes to her beach house each month and recently has felt less safe AMB on soft sand, also no longer attempts to access water alone due to imbalance.    Currently in Pain? No/denies                 Neuro Re- Ed:  Marching with 4-second holds in order to improve single-leg stance and stability 3x10 BLE.  Minimal upper extremity use in order to maintain balance. VC to activate glute med and TA. VC to shift weight over stance  leg.   Airex step up and over. -RLE: airex pad on 6" block, 2x10 -LLE: double layer airex pads, 2x10   Airex with normal stance  10 seconds eyes closed, open eyes until steady, close for another 10, repeat x6 for 2 sets. -Moderate/heavy A-P sway noted with CGA to steady. Pt provided tactile feedback of hand on back to make pt aware of posterior lean. Performance did improve with increased reps.   Lateral stepping over hurdle with SLS x2-4 seconds until stable. Hurdle used to promote increased lateral weight shifting.   Lateral step over hurdle with SLS followed by backwards step and side stepping to return to starting position next to hurdle (U shape with stepping). Occasional cross over of feet during multidirectional foot work, pt self-corrects to realign. 10 in each  direction.    CGA was provided and gait belt donned in order to ensure pt safety with all exercises.      Pt educated throughout session about proper posture and technique with exercises. Improved exercise technique, movement at target joints, use of target muscles after min to mod verbal, visual, tactile cues.     Clinical Impression: Pt demonstrated good motivation throughout today's session. Stability exercises were progressed with increased weight shifting to discover COM and BOS as well as multidirectional footwork. Pt is challenged with SLS, LLE>RLE. Due to L knee pain, 6" step was removed from "step-up and over" exercise and replaced with another airex pad. Pt demo LOB in all directions depending on exercise/challenge. A-P with eyes closed and laterally with transition of weight shift into SLS. Pt will continue to benefit from skilled PT in order to improve pt balance, ambulatory capacity and decrease risk of future falls.           PT Short Term Goals - 01/06/21 1656       PT SHORT TERM GOAL #1   Title After 4 weeks pt will demonstrate SLS balance >30sec bilat.    Baseline Eval: ~3 sec bilat; 7/19: 48 L, 24 sec R, 9/29: 12 sec R 4 sec L,    Time 4    Period Weeks    Status Partially Met    Target Date 11/11/20      PT SHORT TERM GOAL #2   Title Pt to demonstrate backwards walking gait speed >0.32ms to reveal improved sagittal plane motor control in bilat ankles.    Baseline 6/13: 0.67 m/s, 7/18: 0.7 m/s, 12/26/20: .51 m/s    Time 4    Period Weeks    Status Partially Met    Target Date 11/11/20      PT SHORT TERM GOAL #3   Title Pt to demonstrate ability to AMB in thick grass at modI level >2018fwith LRAD and without LOB.    Baseline difficulty with uneven surfaces, avoids at eval: 7/18: deferred to next appointment; 7/25: pt ambulates over 200 ft in thick grass w/o LOB and no AD    Time 4    Period Weeks    Status Achieved    Target Date 11/11/20                PT Long Term Goals - 01/06/21 1656       PT LONG TERM GOAL #1   Title Pt will score 81 or greater on FOTO survey to demonstrate a sustained level of activity tolerance.    Baseline 7/18: unable to assess this session d/t server being down; 7/25: 82, 9/29:82.5 10/10: 78  Time 8    Period Weeks    Status Achieved      PT LONG TERM GOAL #2   Title Pt will demonstrate SLS balance >45 seconds shod to shoe improved ankle/trunk motorcontrol.    Baseline ~3 sec at eval; 7/18: L 48 sec, R: 24 sec    Time 8    Period Weeks    Status Partially Met    Target Date 02/20/21      PT LONG TERM GOAL #3   Title Pt to demonstrate backward walking gait speed ~ 0.44ms or greater to show improved facility in ankle motor control in gait.    Baseline 6/13: 0.67 m/s; 7/18 0.7 m/s    Time 8    Period Weeks    Status Partially Met    Target Date 02/20/21      PT LONG TERM GOAL #4   Title Pt will demonstrate safe and appropriate AMB on unlevel, uneven, and soft surfaces to show independent and safe ability to access beach area ad lib.    Baseline low confidence, concerns of safety at eval; 7/18: pt felt fairly confident amb. ad lib over uneven and soft surface. Reports overal improvement with ability to ambulate on beach. Only shows two instances of decreased stability and step strategy to regain balance; 7/25: pt demos ability to amb. on uneven, unlevel surfaces without LOB, however, reports difficulty in dim lighting    Time 8    Period Weeks    Status Achieved                   Plan - 01/21/21 1612     Clinical Impression Statement Pt demonstrated good motivation throughout today's session. Stability exercises were progressed with increased weight shifting to discover COM and BOS as well as multidirectional footwork. Pt is challenged with SLS, LLE>RLE. Due to L knee pain, 6" step was removed from "step-up and over" exercise and replaced with another airex pad. Pt demo LOB in all  directions depending on exercise/challenge. A-P with eyes closed and laterally with transition of weight shift into SLS. Pt will continue to benefit from skilled PT in order to improve pt balance, ambulatory capacity and decrease risk of future falls.    Personal Factors and Comorbidities Past/Current Experience;Time since onset of injury/illness/exacerbation    Examination-Activity Limitations Locomotion Level;Stairs;Squat    Examination-Participation Restrictions Cleaning;Community Activity;Yard Work;Other    Stability/Clinical Decision Making Stable/Uncomplicated    Rehab Potential Excellent    PT Frequency 2x / week    PT Duration 8 weeks    PT Treatment/Interventions Balance training;Functional mobility training;Therapeutic activities;Therapeutic exercise;Neuromuscular re-education;Patient/family education;Gait training;Stair training;Ultrasound;Canalith Repostioning;Electrical Stimulation;DME Instruction    PT Next Visit Plan balance on incline, decline, and banked surfaces; assess AMB in grass, ankle inv/ev measurement, balance on compliant surfaces, dynamic balance, balance activities that require increased use of vestibular cues, tasks with EC or in dimly lit areas, continue POC    PT Home Exercise Plan Access Code: [email or web address] no updates on this date; 7/18: reissued handout of SLS, tandem stance and slow marches for HEP with instruction to perform at support surface with chair behind her and to not perform if feeling increasingly unsteady. Pt verbalized understanding.  Updated tandem stance to progression from standard tandem stance to tandem stance with lateral and vertical head turns    Consulted and Agree with Plan of Care Patient             Patient will benefit from skilled therapeutic  intervention in order to improve the following deficits and impairments:  Decreased strength, Difficulty walking, Decreased balance, Decreased coordination, Decreased activity tolerance, Decreased  knowledge of use of DME, Postural dysfunction, Improper body mechanics  Visit Diagnosis: Abnormality of gait and mobility  Other abnormalities of gait and mobility  Difficulty in walking, not elsewhere classified  Other lack of coordination  Muscle weakness (generalized)  Unsteadiness on feet     Problem List Patient Active Problem List   Diagnosis Date Noted   Cataract cortical, senile 08/03/2017   Chronic colitis 08/03/2017   Medicare annual wellness visit, initial 12/15/2016   Benign essential hypertension 11/04/2016   OA (osteoarthritis) of knee 02/12/2016   Hyperlipidemia, mixed 10/24/2015   Clostridium difficile colitis 01/02/2014   Right bundle branch block 10/18/2013    Patrina Levering PT, DPT  01/21/2021, 4:14 PM  [place] MAIN [facility] SERVICES [place], [place], [address] Phone: [phone]   Fax:  [phone]  Name: Kiara [name] MRN: [id number] Date of Birth: [birth year]

## 2021-01-23 ENCOUNTER — Ambulatory Visit: Payer: Medicare PPO | Admitting: Physical Therapy

## 2021-01-23 ENCOUNTER — Other Ambulatory Visit: Payer: Self-pay

## 2021-01-23 DIAGNOSIS — R2681 Unsteadiness on feet: Secondary | ICD-10-CM | POA: Diagnosis not present

## 2021-01-23 DIAGNOSIS — R278 Other lack of coordination: Secondary | ICD-10-CM

## 2021-01-23 DIAGNOSIS — M6281 Muscle weakness (generalized): Secondary | ICD-10-CM

## 2021-01-23 DIAGNOSIS — R262 Difficulty in walking, not elsewhere classified: Secondary | ICD-10-CM

## 2021-01-23 DIAGNOSIS — R2689 Other abnormalities of gait and mobility: Secondary | ICD-10-CM

## 2021-01-23 DIAGNOSIS — R269 Unspecified abnormalities of gait and mobility: Secondary | ICD-10-CM

## 2021-01-23 NOTE — Therapy (Signed)
[place] MAIN [facility] SERVICES [address] [place], [place], [address] Phone: [phone]   Fax:  [phone]  Physical Therapy Treatment  Patient Details  Name: Kiara [name] MRN: [id number] Date of Birth: 04-26-1934 Referring Provider (PT): Emily Filbert MD (PCP)   Encounter Date: 01/23/2021   PT End of Session - 01/23/21 1432     Visit Number 23    Number of Visits 32    Date for PT Re-Evaluation 02/20/21    Authorization Type [employer] PPO: authorized for 16 visits 09/09/20-11/27/20    Authorization Time Period Cert 06/12/15-08/29/58; Cert 7/37/10-09/22/92, cert/ auth 8/54-62/7    Progress Note Due on Visit 40    PT Start Time 1100    PT Stop Time 1144    PT Time Calculation (min) 44 min    Equipment Utilized During Treatment Gait belt    Activity Tolerance Patient tolerated treatment well    Behavior During Therapy WFL for tasks assessed/performed             Past Medical History:  Diagnosis Date   Arthritis    osteoarthritis -right knee.   C. difficile colitis    past,no recent issues   Colitis    Diverticulitis    Fall    "tripped"-10 days ago-scrapped right knee and lateral right brow area-" healing"   GERD (gastroesophageal reflux disease)    occ.   Seasonal allergies     Past Surgical History:  Procedure Laterality Date   APPENDECTOMY     CATARACT EXTRACTION, BILATERAL     EYE SURGERY     ? right eye retina repair.   HEMORRHOID SURGERY     KNEE ARTHROSCOPY Right 11/26/2014   Procedure: ARTHROSCOPY KNEE, Tear medical horn and anterior horn. Lateral mesicus tear, grade 3 medial;  Surgeon: Dereck Leep, MD;  Location: [facility]S;  Service: Orthopedics;  Laterality: Right;   MOLE REMOVAL     benign   TONSILLECTOMY     TOTAL KNEE ARTHROPLASTY Right 02/12/2016   Procedure: RIGHT TOTAL KNEE ARTHROPLASTY;  Surgeon: Gaynelle Arabian, MD;  Location: [facility];  Service: Orthopedics;  Laterality: Right;    There  were no vitals filed for this visit.   Subjective Assessment - 01/23/21 1107     Subjective Patient reports no falls or loss of balance since previous session.  No questions or concerns at this time.    Pertinent History Kiara [name] is an 55yoF who comes to [place] referred by PCP for imbalance in walking. 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 Pt also had an acute onset transient episode of AMB 1 month prior wherein she could only AMB in a circle, but not in a straight line. This occurred while out walking a dog with friend, ultimately sustained a fall onto Rt hand and Rt hip, no injury sustained. Pt goes to her beach house each month and recently has felt less safe AMB on soft sand, also no longer attempts to access water alone due to imbalance.    How long can you sit comfortably? not related to this episode    How long can you stand comfortably? not related to this episode             Treatment provided this session      Neuro Re- Ed:   Step up to 2x airex pad  -10 x ea LE  -  increased difficulty with R LE as stance LE   Lateral step ups to 2 x airex pad (stacked)  - min c/o knee discomfort with the left knee with this version  Ambulation with several challenging tasks including retro walking, quick turns, and walking with eyes cllosed and turns with eyes closed. No LOB but pt unable to ambulaiton without deviation outside of 2-4 foot range.  -most good turns and transitions with eyes closed   Airex stance with cone taps on 6 in step, 4 different colors  -PT directed pt which cone to touch and which LE to utilize -increased efficacy with increased repetitions, also improved anticipitory postural responses as well as anticipatory movement of stance LE to improve task efficacy  -2 min rounds, x 2 rounds -round 2 challenged with multiple cones taps, increased error with multiple cone raps    HR/ Toe raise -cues for slow movements to establish limits of stability without requiring hip or stepping strategy to recover balance  Rocker board M/L and A/P orientation  X 15 ea direction  -increased difficulty with posterior rocking   Unless otherwise stated, CGA was provided and gait belt donned in order to ensure pt safety with high level balance tasks   Pt educated throughout session about proper posture and technique with exercises. Improved exercise technique, movement at target joints, use of target muscles after min to mod verbal, visual, tactile cues.                              PT Short Term Goals - 01/06/21 1656       PT SHORT TERM GOAL #1   Title After 4 weeks pt will demonstrate SLS balance >30sec bilat.    Baseline Eval: ~3 sec bilat; 7/19: 48 L, 24 sec R, 9/29: 12 sec R 4 sec L,    Time 4    Period Weeks    Status Partially Met    Target Date 11/11/20      PT SHORT TERM GOAL #2   Title Pt to demonstrate backwards walking gait speed >0.79ms to reveal improved sagittal plane motor control in bilat ankles.    Baseline 6/13: 0.67 m/s, 7/18: 0.7 m/s, 12/26/20: .51 m/s    Time 4    Period Weeks    Status Partially Met    Target Date 11/11/20      PT SHORT TERM GOAL #3   Title Pt to demonstrate ability to AMB in thick grass at modI level >2046fwith LRAD and without LOB.    Baseline difficulty with uneven surfaces, avoids at eval: 7/18: deferred to next appointment; 7/25: pt ambulates over 200 ft in thick grass w/o LOB and no AD    Time 4    Period Weeks    Status Achieved    Target Date 11/11/20               PT Long Term Goals - 01/06/21 1656       PT LONG TERM GOAL #1   Title Pt will score 81 or greater on FOTO survey to demonstrate a sustained level of activity tolerance.    Baseline 7/18: unable to assess this session d/t server being down; 7/25: 82, 9/29:82.5 10/10: 78    Time 8    Period Weeks    Status Achieved       PT LONG TERM GOAL #2   Title Pt will demonstrate SLS balance >45 seconds  shod to shoe improved ankle/trunk motorcontrol.    Baseline ~3 sec at eval; 7/18: L 48 sec, R: 24 sec    Time 8    Period Weeks    Status Partially Met    Target Date 02/20/21      PT LONG TERM GOAL #3   Title Pt to demonstrate backward walking gait speed ~ 0.34ms or greater to show improved facility in ankle motor control in gait.    Baseline 6/13: 0.67 m/s; 7/18 0.7 m/s    Time 8    Period Weeks    Status Partially Met    Target Date 02/20/21      PT LONG TERM GOAL #4   Title Pt will demonstrate safe and appropriate AMB on unlevel, uneven, and soft surfaces to show independent and safe ability to access beach area ad lib.    Baseline low confidence, concerns of safety at eval; 7/18: pt felt fairly confident amb. ad lib over uneven and soft surface. Reports overal improvement with ability to ambulate on beach. Only shows two instances of decreased stability and step strategy to regain balance; 7/25: pt demos ability to amb. on uneven, unlevel surfaces without LOB, however, reports difficulty in dim lighting    Time 8    Period Weeks    Status Achieved                   Plan - 01/23/21 1433     Clinical Impression Statement Pt continues to demonstrate good motivation for completion of exercises during today's session.  Patient continue to demonstrate improved lower extremity control with dynamic gait training exercises as well as higher intensity neuromuscular education exercises.  Patient has had no falls in the last several weeks and was able to go to the beach and navigate stand environment without fear of falling.  Patient will continue to benefit from skilled physical therapy intervention in order to improve her lower extremity strength, decrease her fall risk, improve her balance, and improve her overall quality of life.    Personal Factors and Comorbidities Past/Current Experience;Time since  onset of injury/illness/exacerbation    Examination-Activity Limitations Locomotion Level;Stairs;Squat    Examination-Participation Restrictions Cleaning;Community Activity;Yard Work;Other    Stability/Clinical Decision Making Stable/Uncomplicated    Rehab Potential Excellent    PT Frequency 2x / week    PT Duration 8 weeks    PT Treatment/Interventions Balance training;Functional mobility training;Therapeutic activities;Therapeutic exercise;Neuromuscular re-education;Patient/family education;Gait training;Stair training;Ultrasound;Canalith Repostioning;Electrical Stimulation;DME Instruction    PT Next Visit Plan balance on incline, decline, and banked surfaces; assess AMB in grass, ankle inv/ev measurement, balance on compliant surfaces, dynamic balance, balance activities that require increased use of vestibular cues, tasks with EC or in dimly lit areas, continue POC    PT Home Exercise Plan Access Code: [email or web address] no updates on this date; 7/18: reissued handout of SLS, tandem stance and slow marches for HEP with instruction to perform at support surface with chair behind her and to not perform if feeling increasingly unsteady. Pt verbalized understanding.  Updated tandem stance to progression from standard tandem stance to tandem stance with lateral and vertical head turns    Consulted and Agree with Plan of Care Patient             Patient will benefit from skilled therapeutic intervention in order to improve the following deficits and impairments:  Decreased strength, Difficulty walking, Decreased balance, Decreased coordination, Decreased activity tolerance, Decreased knowledge of use of DME, Postural dysfunction, Improper body  mechanics  Visit Diagnosis: Abnormality of gait and mobility  Other abnormalities of gait and mobility  Difficulty in walking, not elsewhere classified  Other lack of coordination  Muscle weakness (generalized)  Unsteadiness on feet     Problem  List Patient Active Problem List   Diagnosis Date Noted   Cataract cortical, senile 08/03/2017   Chronic colitis 08/03/2017   Medicare annual wellness visit, initial 12/15/2016   Benign essential hypertension 11/04/2016   OA (osteoarthritis) of knee 02/12/2016   Hyperlipidemia, mixed 10/24/2015   Clostridium difficile colitis 01/02/2014   Right bundle branch block 10/18/2013    Particia Lather, PT 01/23/2021, 2:40 PM  [place] MAIN [facility] SERVICES [address] [place], [place], [address] Phone: [phone]   Fax:  [phone]  Name: Kiara [name] MRN: [id number] Date of Birth: October 31, 1934

## 2021-01-27 ENCOUNTER — Other Ambulatory Visit: Payer: Self-pay

## 2021-01-27 ENCOUNTER — Ambulatory Visit: Payer: Medicare PPO

## 2021-01-27 DIAGNOSIS — R2681 Unsteadiness on feet: Secondary | ICD-10-CM | POA: Diagnosis not present

## 2021-01-27 DIAGNOSIS — R2689 Other abnormalities of gait and mobility: Secondary | ICD-10-CM

## 2021-01-27 DIAGNOSIS — R278 Other lack of coordination: Secondary | ICD-10-CM

## 2021-01-27 NOTE — Therapy (Signed)
[place] MAIN [facility] SERVICES [address] [place], [place], [address] Phone: [phone]   Fax:  [phone]  Physical Therapy Treatment  Patient Details  Name: Kiara [name] MRN: [id number] Date of Birth: [birth year] Referring Provider (PT): Emily Filbert MD (PCP)   Encounter Date: 01/27/2021   PT End of Session - 01/27/21 1655     Visit Number 24    Number of Visits 32    Date for PT Re-Evaluation 02/20/21    Authorization Type [employer] Choice PPO: authorized for 16 visits 09/09/20-11/27/20    Authorization Time Period Cert 7/74/12-11/03/84; Cert 7/67/20-9/47/09, cert/ auth 6/28-36/6    Progress Note Due on Visit 40    PT Start Time 1601    PT Stop Time 1644    PT Time Calculation (min) 43 min    Equipment Utilized During Treatment Gait belt    Activity Tolerance Patient tolerated treatment well    Behavior During Therapy WFL for tasks assessed/performed             Past Medical History:  Diagnosis Date   Arthritis    osteoarthritis -right knee.   C. difficile colitis    past,no recent issues   Colitis    Diverticulitis    Fall    "tripped"-10 days ago-scrapped right knee and lateral right brow area-" healing"   GERD (gastroesophageal reflux disease)    occ.   Seasonal allergies     Past Surgical History:  Procedure Laterality Date   APPENDECTOMY     CATARACT EXTRACTION, BILATERAL     EYE SURGERY     ? right eye retina repair.   HEMORRHOID SURGERY     KNEE ARTHROSCOPY Right 11/26/2014   Procedure: ARTHROSCOPY KNEE, Tear medical horn and anterior horn. Lateral mesicus tear, grade 3 medial;  Surgeon: Dereck Leep, MD;  Location: [facility] ORS;  Service: Orthopedics;  Laterality: Right;   MOLE REMOVAL     benign   TONSILLECTOMY     TOTAL KNEE ARTHROPLASTY Right 02/12/2016   Procedure: RIGHT TOTAL KNEE ARTHROPLASTY;  Surgeon: Gaynelle Arabian, MD;  Location: [facility];  Service: Orthopedics;  Laterality: Right;    There  were no vitals filed for this visit.   Subjective Assessment - 01/27/21 1605     Subjective Pt reports "I'm pretty good I think." Pt states, "it seems I have to concentrate really hard about what I have to do before I do it. That bothers me," when discussing her balance. Pt reports she noticed that when she falls she tends to go to the R.    Pertinent History Kiara [name] is an 30yoF who comes to [place] referred by PCP for imbalance in walking. 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 Pt also had an acute onset transient episode of AMB 1 month prior wherein she could only AMB in a circle, but not in a straight line. This occurred while out walking a dog with friend, ultimately sustained a fall onto Rt hand and Rt hip, no injury sustained. Pt goes to her beach house each month and recently has felt less safe AMB on soft sand, also no longer attempts to access water alone due to imbalance.    How long can you sit comfortably? not related to this episode    How long can you stand comfortably? not related to this episode    Currently in Pain? No/denies  Neuro Re- Ed:    Step up to 2x airex pad - attempted but very challenging for pt, multiple instances of LOB where PT used up to min a and pt used UE support to regain balance. Modified to one airex pad x multiple reps alternating lead LE Progressed to performing with dual cog task, pt challenged with dual task. Comments: Overall difficulty with foot clearance  Forward/backward stepping over hurdle, cuing to increase step-height. Initiated with UE support and then progressed to no UE support. X multiple reps. Decreased stability on LLE compared to R.  Lateral stepping over hurdle. Initiated with BUE support on bars and progressed to no UE support. 10x. Continued decrease in LLE stability compared to RLE.  SLB LLE 2x30 sec, intermittent UE  support. --progressed to with dual cog task (counting backwards), rates more challenging.  SLB RLE with dual cog task (counting backwards) - intermittent toe touch to regain balance, but holds for longer compared to LLE.  Ambulation with several challenging tasks including retro walking, quick turns, and walking with eyes closed. Comments: pt tendency to turn R with EC    Slow heel raise to toe raise without UE support  -progressed to on airex pad. Exhibits intermittent use of hip strategy, sometimes absent where pt then loses balance posteriorly and must reach out/use UE support to regain balance  Standing gastrocsol stretch to improve ankle ROM for use of ankle strategies - 2x30 sec each LE  Cone taps, firm surface, 3 different colored cones placed in front of pt. Pt then instructed in which color cone to tap and with which LE. Gradually increased complexity of sequence. Challenging for pt. Intermittent UE support. Knocks over cones.  Reinforced importance of performing HEP to make progress in PT and maintain gains. Pt verbalized understanding.    Pt educated throughout session about proper posture and technique with exercises. Improved exercise technique, movement at target joints, use of target muscles after min to mod verbal, visual, tactile cues.       PT Education - 01/27/21 1637     Education Details exercise techinque, body mechanics    Person(s) Educated Patient    Methods Explanation;Demonstration;Verbal cues    Comprehension Verbalized understanding;Returned demonstration;Need further instruction              PT Short Term Goals - 01/06/21 1656       PT SHORT TERM GOAL #1   Title After 4 weeks pt will demonstrate SLS balance >30sec bilat.    Baseline Eval: ~3 sec bilat; 7/19: 48 L, 24 sec R, 9/29: 12 sec R 4 sec L,    Time 4    Period Weeks    Status Partially Met    Target Date 11/11/20      PT SHORT TERM GOAL #2   Title Pt to demonstrate backwards walking  gait speed >0.47ms to reveal improved sagittal plane motor control in bilat ankles.    Baseline 6/13: 0.67 m/s, 7/18: 0.7 m/s, 12/26/20: .51 m/s    Time 4    Period Weeks    Status Partially Met    Target Date 11/11/20      PT SHORT TERM GOAL #3   Title Pt to demonstrate ability to AMB in thick grass at modI level >2086fwith LRAD and without LOB.    Baseline difficulty with uneven surfaces, avoids at eval: 7/18: deferred to next appointment; 7/25: pt ambulates over 200 ft in thick grass w/o LOB and no AD  Time 4    Period Weeks    Status Achieved    Target Date 11/11/20               PT Long Term Goals - 01/06/21 1656       PT LONG TERM GOAL #1   Title Pt will score 81 or greater on FOTO survey to demonstrate a sustained level of activity tolerance.    Baseline 7/18: unable to assess this session d/t server being down; 7/25: 82, 9/29:82.5 10/10: 78    Time 8    Period Weeks    Status Achieved      PT LONG TERM GOAL #2   Title Pt will demonstrate SLS balance >45 seconds shod to shoe improved ankle/trunk motorcontrol.    Baseline ~3 sec at eval; 7/18: L 48 sec, R: 24 sec    Time 8    Period Weeks    Status Partially Met    Target Date 02/20/21      PT LONG TERM GOAL #3   Title Pt to demonstrate backward walking gait speed ~ 0.70ms or greater to show improved facility in ankle motor control in gait.    Baseline 6/13: 0.67 m/s; 7/18 0.7 m/s    Time 8    Period Weeks    Status Partially Met    Target Date 02/20/21      PT LONG TERM GOAL #4   Title Pt will demonstrate safe and appropriate AMB on unlevel, uneven, and soft surfaces to show independent and safe ability to access beach area ad lib.    Baseline low confidence, concerns of safety at eval; 7/18: pt felt fairly confident amb. ad lib over uneven and soft surface. Reports overal improvement with ability to ambulate on beach. Only shows two instances of decreased stability and step strategy to regain balance;  7/25: pt demos ability to amb. on uneven, unlevel surfaces without LOB, however, reports difficulty in dim lighting    Time 8    Period Weeks    Status Achieved                   Plan - 01/27/21 1656     Clinical Impression Statement Pt exhibits some decrease in balance compared to prior sessions. PT modified balance interventions to decreased level of difficulty. Pt required CGA-min a throughout and intermittent UE support PT reinforced importance of pt performing HEP to make and maintain gains. Pt verbalized understanding. The pt will benefit from further skilled PT to improve strength, gait and balance to decrease fall risk.    Personal Factors and Comorbidities Past/Current Experience;Time since onset of injury/illness/exacerbation    Examination-Activity Limitations Locomotion Level;Stairs;Squat    Examination-Participation Restrictions Cleaning;Community Activity;Yard Work;Other    Stability/Clinical Decision Making Stable/Uncomplicated    Rehab Potential Excellent    PT Frequency 2x / week    PT Duration 8 weeks    PT Treatment/Interventions Balance training;Functional mobility training;Therapeutic activities;Therapeutic exercise;Neuromuscular re-education;Patient/family education;Gait training;Stair training;Ultrasound;Canalith Repostioning;Electrical Stimulation;DME Instruction    PT Next Visit Plan balance on incline, decline, and banked surfaces; assess AMB in grass, ankle inv/ev measurement, balance on compliant surfaces, dynamic balance, balance activities that require increased use of vestibular cues, tasks with EC or in dimly lit areas, continue POC    PT Home Exercise Plan Access Code: [email or web address] no updates on this date; 7/18: reissued handout of SLS, tandem stance and slow marches for HEP with instruction to perform at support surface with chair behind her and  to not perform if feeling increasingly unsteady. Pt verbalized understanding.  Updated tandem stance to  progression from standard tandem stance to tandem stance with lateral and vertical head turns    Consulted and Agree with Plan of Care Patient             Patient will benefit from skilled therapeutic intervention in order to improve the following deficits and impairments:  Decreased strength, Difficulty walking, Decreased balance, Decreased coordination, Decreased activity tolerance, Decreased knowledge of use of DME, Postural dysfunction, Improper body mechanics  Visit Diagnosis: Unsteadiness on feet  Other abnormalities of gait and mobility  Other lack of coordination     Problem List Patient Active Problem List   Diagnosis Date Noted   Cataract cortical, senile 08/03/2017   Chronic colitis 08/03/2017   Medicare annual wellness visit, initial 12/15/2016   Benign essential hypertension 11/04/2016   OA (osteoarthritis) of knee 02/12/2016   Hyperlipidemia, mixed 10/24/2015   Clostridium difficile colitis 01/02/2014   Right bundle branch block 10/18/2013    Zollie Pee, PT 01/27/2021, 5:08 PM  [place] MAIN [facility] SERVICES [address] [place], [place], [address] Phone: [phone]   Fax:  [phone]  Name: Kiara [name] MRN: [id number] Date of Birth: 1934/09/06

## 2021-01-29 ENCOUNTER — Other Ambulatory Visit: Payer: Self-pay

## 2021-01-29 ENCOUNTER — Ambulatory Visit: Payer: Medicare PPO | Attending: Neurology

## 2021-01-29 DIAGNOSIS — R262 Difficulty in walking, not elsewhere classified: Secondary | ICD-10-CM | POA: Insufficient documentation

## 2021-01-29 DIAGNOSIS — M6281 Muscle weakness (generalized): Secondary | ICD-10-CM | POA: Insufficient documentation

## 2021-01-29 DIAGNOSIS — R2681 Unsteadiness on feet: Secondary | ICD-10-CM | POA: Diagnosis present

## 2021-01-29 DIAGNOSIS — R2689 Other abnormalities of gait and mobility: Secondary | ICD-10-CM | POA: Diagnosis present

## 2021-01-29 DIAGNOSIS — R269 Unspecified abnormalities of gait and mobility: Secondary | ICD-10-CM | POA: Insufficient documentation

## 2021-01-29 DIAGNOSIS — R278 Other lack of coordination: Secondary | ICD-10-CM | POA: Diagnosis present

## 2021-01-29 NOTE — Therapy (Signed)
[place] MAIN [facility] SERVICES [address] [place], [place], [address] Phone: [phone]   Fax:  [phone]  Physical Therapy Treatment  Patient Details  Name: Kiara [name] MRN: [id number] Date of Birth: Jun 26, 1934 Referring Provider (PT): Emily Filbert MD (PCP)   Encounter Date: 01/29/2021   PT End of Session - 01/29/21 1106     Visit Number 25    Number of Visits 32    Date for PT Re-Evaluation 02/20/21    Authorization Type [employer] PPO: authorized for 16 visits 09/09/20-11/27/20    Authorization Time Period Cert 0/05/11-0/2/11; Cert 1/73/56-09/28/39, cert/ auth 0/30-13/1    Progress Note Due on Visit 40    PT Start Time 1016    PT Stop Time 1100    PT Time Calculation (min) 44 min    Equipment Utilized During Treatment Gait belt    Activity Tolerance Patient tolerated treatment well    Behavior During Therapy WFL for tasks assessed/performed             Past Medical History:  Diagnosis Date   Arthritis    osteoarthritis -right knee.   C. difficile colitis    past,no recent issues   Colitis    Diverticulitis    Fall    "tripped"-10 days ago-scrapped right knee and lateral right brow area-" healing"   GERD (gastroesophageal reflux disease)    occ.   Seasonal allergies     Past Surgical History:  Procedure Laterality Date   APPENDECTOMY     CATARACT EXTRACTION, BILATERAL     EYE SURGERY     ? right eye retina repair.   HEMORRHOID SURGERY     KNEE ARTHROSCOPY Right 11/26/2014   Procedure: ARTHROSCOPY KNEE, Tear medical horn and anterior horn. Lateral mesicus tear, grade 3 medial;  Surgeon: Dereck Leep, MD;  Location: [facility] ORS;  Service: Orthopedics;  Laterality: Right;   MOLE REMOVAL     benign   TONSILLECTOMY     TOTAL KNEE ARTHROPLASTY Right 02/12/2016   Procedure: RIGHT TOTAL KNEE ARTHROPLASTY;  Surgeon: Gaynelle Arabian, MD;  Location: [facility];  Service: Orthopedics;  Laterality: Right;    There  were no vitals filed for this visit.   Subjective Assessment - 01/29/21 1014     Subjective Pt reports no stumbles, falls. She reports no pain. Pt reports she feels good about her balance.    Pertinent History Kiara [name] is an 71yoF who comes to [place] referred by PCP for imbalance in walking. 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 Pt also had an acute onset transient episode of AMB 1 month prior wherein she could only AMB in a circle, but not in a straight line. This occurred while out walking a dog with friend, ultimately sustained a fall onto Rt hand and Rt hip, no injury sustained. Pt goes to her beach house each month and recently has felt less safe AMB on soft sand, also no longer attempts to access water alone due to imbalance.    How long can you sit comfortably? not related to this episode    How long can you stand comfortably? not related to this episode    How long can you walk comfortably? not related to this episode    Patient Stated Goals Feel more confident in walking at the beach and be able to safely access the ocean.  Currently in Pain? No/denies            INTERVENTIONS -  In // bars with CGA throughout unless otherwise noted  Standing on Airex Pad: WBOS, EO, no UE support 30 sec WBOS, EC, 3x30 sec, intermittent UE support. Cuing to maintain head in neutral position NBOS EO 30 sec NBOS EC 4x30 sec with dual task with last set. Pt with LOB to R side, requiring min a and UE support to regain balance.  SLB 4x30 sec each LE; cuing to look forward. Intermittent UE support --progressed to [facility] with UUE support on bar 2x30 sec to promote use of proprioceptive and vestibular cues  Cone taps, firm surface, 3 different colored cones placed in front of pt. Pt then instructed in which color cone to tap and with which LE. Gradually increased complexity of sequence. Challenging for pt.  Intermittent UE support. Knocks over cones several times, does improve with reps.  Step up to airex pad - 2x10, 1x10 with dual cog task; improved from prior session, however, 2 instances of not clearing step.  Forward/backward stepping over hurdle, cuing to increase step-heigh, no UE support -  x multiple reps. Continued decreased stability on LLE compared to R.   Red mat ambulating forward/backward, side-to-side, and with turns in each direction with dual cog task.   Ambulating with vertical head turns over 10 meter 2x  Ambulating with horizontal head turns turns over 10 meters 4x; more challenging, increased variability in BOS  Pt educated throughout session about proper posture and technique with exercises. Improved exercise technique, movement at target joints, use of target muscles after min to mod verbal, visual, tactile cues.       PT Education - 01/29/21 1105     Education Details exercise technique, body mechanics    Person(s) Educated Patient    Methods Explanation;Demonstration;Tactile cues;Verbal cues    Comprehension Verbalized understanding;Returned demonstration;Need further instruction;Verbal cues required              PT Short Term Goals - 01/06/21 1656       PT SHORT TERM GOAL #1   Title After 4 weeks pt will demonstrate SLS balance >30sec bilat.    Baseline Eval: ~3 sec bilat; 7/19: 48 L, 24 sec R, 9/29: 12 sec R 4 sec L,    Time 4    Period Weeks    Status Partially Met    Target Date 11/11/20      PT SHORT TERM GOAL #2   Title Pt to demonstrate backwards walking gait speed >0.72ms to reveal improved sagittal plane motor control in bilat ankles.    Baseline 6/13: 0.67 m/s, 7/18: 0.7 m/s, 12/26/20: .51 m/s    Time 4    Period Weeks    Status Partially Met    Target Date 11/11/20      PT SHORT TERM GOAL #3   Title Pt to demonstrate ability to AMB in thick grass at modI level >2060fwith LRAD and without LOB.    Baseline difficulty with uneven  surfaces, avoids at eval: 7/18: deferred to next appointment; 7/25: pt ambulates over 200 ft in thick grass w/o LOB and no AD    Time 4    Period Weeks    Status Achieved    Target Date 11/11/20               PT Long Term Goals - 01/06/21 1656       PT LONG TERM GOAL #1  Title Pt will score 81 or greater on FOTO survey to demonstrate a sustained level of activity tolerance.    Baseline 7/18: unable to assess this session d/t server being down; 7/25: 82, 9/29:82.5 10/10: 78    Time 8    Period Weeks    Status Achieved      PT LONG TERM GOAL #2   Title Pt will demonstrate SLS balance >45 seconds shod to shoe improved ankle/trunk motorcontrol.    Baseline ~3 sec at eval; 7/18: L 48 sec, R: 24 sec    Time 8    Period Weeks    Status Partially Met    Target Date 02/20/21      PT LONG TERM GOAL #3   Title Pt to demonstrate backward walking gait speed ~ 0.61ms or greater to show improved facility in ankle motor control in gait.    Baseline 6/13: 0.67 m/s; 7/18 0.7 m/s    Time 8    Period Weeks    Status Partially Met    Target Date 02/20/21      PT LONG TERM GOAL #4   Title Pt will demonstrate safe and appropriate AMB on unlevel, uneven, and soft surfaces to show independent and safe ability to access beach area ad lib.    Baseline low confidence, concerns of safety at eval; 7/18: pt felt fairly confident amb. ad lib over uneven and soft surface. Reports overal improvement with ability to ambulate on beach. Only shows two instances of decreased stability and step strategy to regain balance; 7/25: pt demos ability to amb. on uneven, unlevel surfaces without LOB, however, reports difficulty in dim lighting    Time 8    Period Weeks    Status Achieved                   Plan - 01/29/21 1126     Clinical Impression Statement Pt exhibits some improvement with foot clearance and stability on compliant surfaces compared to previous session. She requires less support from  PT with these interventions tdoay. Pt still very challenged with use of vestibular cues, and with increased difficulty when she must primarily support herself on LLE. The pt will benefit from further skilled PT to improve strength, gait and balance to decrease fall risk.    Personal Factors and Comorbidities Past/Current Experience;Time since onset of injury/illness/exacerbation    Examination-Activity Limitations Locomotion Level;Stairs;Squat    Examination-Participation Restrictions Cleaning;Community Activity;Yard Work;Other    Stability/Clinical Decision Making Stable/Uncomplicated    Rehab Potential Excellent    PT Frequency 2x / week    PT Duration 8 weeks    PT Treatment/Interventions Balance training;Functional mobility training;Therapeutic activities;Therapeutic exercise;Neuromuscular re-education;Patient/family education;Gait training;Stair training;Ultrasound;Canalith Repostioning;Electrical Stimulation;DME Instruction    PT Next Visit Plan balance on incline, decline, and banked surfaces; assess AMB in grass, ankle inv/ev measurement, balance on compliant surfaces, dynamic balance, balance activities that require increased use of vestibular cues, tasks with EC or in dimly lit areas, continue POC    PT Home Exercise Plan Access Code: [email or web address] no updates on this date; 7/18: reissued handout of SLS, tandem stance and slow marches for HEP with instruction to perform at support surface with chair behind her and to not perform if feeling increasingly unsteady. Pt verbalized understanding.  Updated tandem stance to progression from standard tandem stance to tandem stance with lateral and vertical head turns    Consulted and Agree with Plan of Care Patient  Patient will benefit from skilled therapeutic intervention in order to improve the following deficits and impairments:  Decreased strength, Difficulty walking, Decreased balance, Decreased coordination, Decreased activity  tolerance, Decreased knowledge of use of DME, Postural dysfunction, Improper body mechanics  Visit Diagnosis: Unsteadiness on feet  Other abnormalities of gait and mobility     Problem List Patient Active Problem List   Diagnosis Date Noted   Cataract cortical, senile 08/03/2017   Chronic colitis 08/03/2017   Medicare annual wellness visit, initial 12/15/2016   Benign essential hypertension 11/04/2016   OA (osteoarthritis) of knee 02/12/2016   Hyperlipidemia, mixed 10/24/2015   Clostridium difficile colitis 01/02/2014   Right bundle branch block 10/18/2013    Zollie Pee, PT 01/29/2021, 11:27 AM  [place] [address] [place], [place], [address] Phone: [phone]   Fax:  [phone]  Name: TANVEER DOBBERSTEIN MRN: [id number] Date of Birth: [birth year]

## 2021-02-03 ENCOUNTER — Ambulatory Visit: Payer: Medicare PPO

## 2021-02-03 ENCOUNTER — Other Ambulatory Visit: Payer: Self-pay

## 2021-02-03 DIAGNOSIS — R2681 Unsteadiness on feet: Secondary | ICD-10-CM | POA: Diagnosis not present

## 2021-02-03 DIAGNOSIS — R2689 Other abnormalities of gait and mobility: Secondary | ICD-10-CM

## 2021-02-03 MED ORDER — PROPOFOL 500 MG/50ML IV EMUL
INTRAVENOUS | Status: AC
  Filled 2021-02-03: qty 50

## 2021-02-03 MED ORDER — PHENYLEPHRINE HCL (PRESSORS) 10 MG/ML IV SOLN
INTRAVENOUS | Status: AC
  Filled 2021-02-03: qty 1

## 2021-02-03 MED ORDER — LIDOCAINE HCL (PF) 2 % IJ SOLN
INTRAMUSCULAR | Status: AC
  Filled 2021-02-03: qty 5

## 2021-02-03 NOTE — Therapy (Signed)
[place] MAIN [facility] SERVICES [address] [place], [place], [address] Phone: [phone]   Fax:  [phone]  Physical Therapy Treatment  Patient Details  Name: Kiara [name] MRN: [id number] Date of Birth: August 29, 1934 Referring Provider (PT): Emily Filbert MD (PCP)   Encounter Date: 02/03/2021   PT End of Session - 02/03/21 1649     Visit Number 26    Number of Visits 32    Date for PT Re-Evaluation 02/20/21    Authorization Type [employer] Choice PPO: authorized for 16 visits 09/09/20-11/27/20    Authorization Time Period Cert 0/24/09-09/30/51; Cert 2/99/24-2/68/34, cert/ auth 1/96-22/2    Progress Note Due on Visit 40    PT Start Time 1605    PT Stop Time 1646    PT Time Calculation (min) 41 min    Equipment Utilized During Treatment Gait belt    Activity Tolerance Patient tolerated treatment well    Behavior During Therapy WFL for tasks assessed/performed             Past Medical History:  Diagnosis Date   Arthritis    osteoarthritis -right knee.   C. difficile colitis    past,no recent issues   Colitis    Diverticulitis    Fall    "tripped"-10 days ago-scrapped right knee and lateral right brow area-" healing"   GERD (gastroesophageal reflux disease)    occ.   Seasonal allergies     Past Surgical History:  Procedure Laterality Date   APPENDECTOMY     CATARACT EXTRACTION, BILATERAL     EYE SURGERY     ? right eye retina repair.   HEMORRHOID SURGERY     KNEE ARTHROSCOPY Right 11/26/2014   Procedure: ARTHROSCOPY KNEE, Tear medical horn and anterior horn. Lateral mesicus tear, grade 3 medial;  Surgeon: Dereck Leep, MD;  Location: [facility];  Service: Orthopedics;  Laterality: Right;   MOLE REMOVAL     benign   TONSILLECTOMY     TOTAL KNEE ARTHROPLASTY Right 02/12/2016   Procedure: RIGHT TOTAL KNEE ARTHROPLASTY;  Surgeon: Gaynelle Arabian, MD;  Location: [facility];  Service: Orthopedics;  Laterality: Right;    There  were no vitals filed for this visit.   Subjective Assessment - 02/03/21 1608     Subjective Pt reports she feels her balance has been poor when performing her HEP. She reports poor sleep recently as well.    Pertinent History Kiara [name] is an 64yoF who comes to [place] referred by PCP for imbalance in walking. 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 Pt also had an acute onset transient episode of AMB 1 month prior wherein she could only AMB in a circle, but not in a straight line. This occurred while out walking a dog with friend, ultimately sustained a fall onto Rt hand and Rt hip, no injury sustained. Pt goes to her beach house each month and recently has felt less safe AMB on soft sand, also no longer attempts to access water alone due to imbalance.    How long can you sit comfortably? not related to this episode    How long can you stand comfortably? not related to this episode    How long can you walk comfortably? not related to this episode    Patient Stated Goals Feel more confident in walking at the beach and be able to safely access the ocean.  Currently in Pain? No/denies               INTERVENTIONS -   In // bars with CGA throughout unless otherwise noted   Standing on Airex Pad: WBOS, EO, no UE support 20 sec NBOS, EO, with cog dual task (counting backwards from 100) x 30 sec; pt rates easy Semi-tandem 30 sec B, cog dual task asking pt about weekend plans; pt rates easy Tandem stance 4x30 sec B; intermittent UE support. Cuing for upright posture. Reports greater difficulty with RLE as primary stance leg.    KoreBalance- Tux Racer 5x over two different tracks, with decreasing levels of UE support (BUE support to 3-finger support). Cuing to maintain LE positioning in order to promote use of ankle and hip strategies (pt tendency to compensate with step, changing LE positioning).    Stepping forward/backward over orange hurdle, alternating lead LE - x multiple reps difficulty clearing RLE, knocks into hurdle multiple times. Pt compensates with RLE circumduction.  SLB - 1x30 sec each LE; intermittent UE support  Ambulating with horizontal head turns turns over 10 meters 2x; Did require UE support due to decreased balance 1x. --progressed to addition of dual cog task of counting down from 100 by 2s, naming animals - 2x, increased errors with cog dual task   Ambulating through obstacle course with 2 hurdles, weaving around multiple cones - pt knocks over cone 1x -progressed to addition of horizontal head turns 2x -progressed to use of vertical head turns 2x -semi-tandem walk through obstacle course - knocks into hurdle 2x, knocks into cone 1x Comments: Pt rates hard  Pt educated throughout session about proper posture and technique with exercises. Improved exercise technique, movement at target joints, use of target muscles after min to mod verbal, visual, tactile cues.    PT Education - 02/03/21 1649     Education Details exercise technique    Person(s) Educated Patient    Methods Explanation;Demonstration;Verbal cues    Comprehension Verbalized understanding;Returned demonstration;Verbal cues required;Need further instruction              PT Short Term Goals - 01/06/21 1656       PT SHORT TERM GOAL #1   Title After 4 weeks pt will demonstrate SLS balance >30sec bilat.    Baseline Eval: ~3 sec bilat; 7/19: 48 L, 24 sec R, 9/29: 12 sec R 4 sec L,    Time 4    Period Weeks    Status Partially Met    Target Date 11/11/20      PT SHORT TERM GOAL #2   Title Pt to demonstrate backwards walking gait speed >0.24ms to reveal improved sagittal plane motor control in bilat ankles.    Baseline 6/13: 0.67 m/s, 7/18: 0.7 m/s, 12/26/20: .51 m/s    Time 4    Period Weeks    Status Partially Met    Target Date 11/11/20      PT SHORT TERM GOAL #3   Title Pt to  demonstrate ability to AMB in thick grass at modI level >2050fwith LRAD and without LOB.    Baseline difficulty with uneven surfaces, avoids at eval: 7/18: deferred to next appointment; 7/25: pt ambulates over 200 ft in thick grass w/o LOB and no AD    Time 4    Period Weeks    Status Achieved    Target Date 11/11/20               PT Long Term  Goals - 01/06/21 1656       PT LONG TERM GOAL #1   Title Pt will score 81 or greater on FOTO survey to demonstrate a sustained level of activity tolerance.    Baseline 7/18: unable to assess this session d/t server being down; 7/25: 82, 9/29:82.5 10/10: 78    Time 8    Period Weeks    Status Achieved      PT LONG TERM GOAL #2   Title Pt will demonstrate SLS balance >45 seconds shod to shoe improved ankle/trunk motorcontrol.    Baseline ~3 sec at eval; 7/18: L 48 sec, R: 24 sec    Time 8    Period Weeks    Status Partially Met    Target Date 02/20/21      PT LONG TERM GOAL #3   Title Pt to demonstrate backward walking gait speed ~ 0.70ms or greater to show improved facility in ankle motor control in gait.    Baseline 6/13: 0.67 m/s; 7/18 0.7 m/s    Time 8    Period Weeks    Status Partially Met    Target Date 02/20/21      PT LONG TERM GOAL #4   Title Pt will demonstrate safe and appropriate AMB on unlevel, uneven, and soft surfaces to show independent and safe ability to access beach area ad lib.    Baseline low confidence, concerns of safety at eval; 7/18: pt felt fairly confident amb. ad lib over uneven and soft surface. Reports overal improvement with ability to ambulate on beach. Only shows two instances of decreased stability and step strategy to regain balance; 7/25: pt demos ability to amb. on uneven, unlevel surfaces without LOB, however, reports difficulty in dim lighting    Time 8    Period Weeks    Status Achieved                   Plan - 02/03/21 1650     Clinical Impression Statement Pt able to progress  to higher level dynamic balance interventions this session. The pt continues to be challenged with cognitive dual task and RLE foot clearance. The pt will benefit from further skilled PT to continue to improve balance in order to decrease fall risk.    Personal Factors and Comorbidities Past/Current Experience;Time since onset of injury/illness/exacerbation    Examination-Activity Limitations Locomotion Level;Stairs;Squat    Examination-Participation Restrictions Cleaning;Community Activity;Yard Work;Other    Stability/Clinical Decision Making Stable/Uncomplicated    Rehab Potential Excellent    PT Frequency 2x / week    PT Duration 8 weeks    PT Treatment/Interventions Balance training;Functional mobility training;Therapeutic activities;Therapeutic exercise;Neuromuscular re-education;Patient/family education;Gait training;Stair training;Ultrasound;Canalith Repostioning;Electrical Stimulation;DME Instruction    PT Next Visit Plan balance on incline, decline, and banked surfaces; assess AMB in grass, ankle inv/ev measurement, balance on compliant surfaces, dynamic balance, balance activities that require increased use of vestibular cues, tasks with EC or in dimly lit areas, continue POC    PT Home Exercise Plan Access Code: [email or web address] no updates on this date; 7/18: reissued handout of SLS, tandem stance and slow marches for HEP with instruction to perform at support surface with chair behind her and to not perform if feeling increasingly unsteady. Pt verbalized understanding.  Updated tandem stance to progression from standard tandem stance to tandem stance with lateral and vertical head turns    Consulted and Agree with Plan of Care Patient  Patient will benefit from skilled therapeutic intervention in order to improve the following deficits and impairments:  Decreased strength, Difficulty walking, Decreased balance, Decreased coordination, Decreased activity tolerance, Decreased  knowledge of use of DME, Postural dysfunction, Improper body mechanics  Visit Diagnosis: Unsteadiness on feet  Other abnormalities of gait and mobility     Problem List Patient Active Problem List   Diagnosis Date Noted   Cataract cortical, senile 08/03/2017   Chronic colitis 08/03/2017   Medicare annual wellness visit, initial 12/15/2016   Benign essential hypertension 11/04/2016   OA (osteoarthritis) of knee 02/12/2016   Hyperlipidemia, mixed 10/24/2015   Clostridium difficile colitis 01/02/2014   Right bundle branch block 10/18/2013    Zollie Pee, PT 02/03/2021, 4:52 PM  [place] MAIN [facility] SERVICES [address] [place], [place], [address] Phone: [phone]   Fax:  [phone]  Name: Kiara [name] MRN: [id number] Date of Birth: 12-Aug-1934

## 2021-02-05 ENCOUNTER — Other Ambulatory Visit: Payer: Self-pay

## 2021-02-05 ENCOUNTER — Encounter: Payer: Self-pay | Admitting: Physical Therapy

## 2021-02-05 ENCOUNTER — Ambulatory Visit: Payer: Medicare PPO | Admitting: Physical Therapy

## 2021-02-05 DIAGNOSIS — R262 Difficulty in walking, not elsewhere classified: Secondary | ICD-10-CM

## 2021-02-05 DIAGNOSIS — R2681 Unsteadiness on feet: Secondary | ICD-10-CM | POA: Diagnosis not present

## 2021-02-05 DIAGNOSIS — R269 Unspecified abnormalities of gait and mobility: Secondary | ICD-10-CM

## 2021-02-05 DIAGNOSIS — R2689 Other abnormalities of gait and mobility: Secondary | ICD-10-CM

## 2021-02-05 DIAGNOSIS — R278 Other lack of coordination: Secondary | ICD-10-CM

## 2021-02-05 DIAGNOSIS — M6281 Muscle weakness (generalized): Secondary | ICD-10-CM

## 2021-02-05 NOTE — Therapy (Signed)
[place] MAIN [facility] SERVICES [address] [place], [place], [address] Phone: [phone]   Fax:  [phone]  Physical Therapy Treatment  Patient Details  Name: Kiara [name] MRN: [id number] Date of Birth: 1934-10-23 Referring Provider (PT): Emily Filbert MD (PCP)   Encounter Date: 02/05/2021   PT End of Session - 02/05/21 1321     Visit Number 27    Number of Visits 32    Date for PT Re-Evaluation 02/20/21    Authorization Type [employer] Choice PPO: authorized for 16 visits 09/09/20-11/27/20    Authorization Time Period Cert 1/93/79-0/2/40; Cert 9/73/53-2/99/24, cert/ auth 2/68-34/1    Progress Note Due on Visit 30    PT Start Time 1148    PT Stop Time 1230    PT Time Calculation (min) 42 min    Equipment Utilized During Treatment Gait belt    Activity Tolerance Patient tolerated treatment well    Behavior During Therapy WFL for tasks assessed/performed             Past Medical History:  Diagnosis Date   Arthritis    osteoarthritis -right knee.   C. difficile colitis    past,no recent issues   Colitis    Diverticulitis    Fall    "tripped"-10 days ago-scrapped right knee and lateral right brow area-" healing"   GERD (gastroesophageal reflux disease)    occ.   Seasonal allergies     Past Surgical History:  Procedure Laterality Date   APPENDECTOMY     CATARACT EXTRACTION, BILATERAL     EYE SURGERY     ? right eye retina repair.   HEMORRHOID SURGERY     KNEE ARTHROSCOPY Right 11/26/2014   Procedure: ARTHROSCOPY KNEE, Tear medical horn and anterior horn. Lateral mesicus tear, grade 3 medial;  Surgeon: Dereck Leep, MD;  Location: [facility];  Service: Orthopedics;  Laterality: Right;   MOLE REMOVAL     benign   TONSILLECTOMY     TOTAL KNEE ARTHROPLASTY Right 02/12/2016   Procedure: RIGHT TOTAL KNEE ARTHROPLASTY;  Surgeon: Gaynelle Arabian, MD;  Location: [facility];  Service: Orthopedics;  Laterality: Right;    There  were no vitals filed for this visit.   Subjective Assessment - 02/05/21 1149     Subjective Pt states she is doing well today. She reports continued challenge with balance during HEP - states she may think it is due to not having a "safety blanket" of the gait belt an PT guarding. Denies falls/LOB.    Pertinent History Kiara [name] is an 88yoF who comes to [place] referred by PCP for imbalance in walking. 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 Pt also had an acute onset transient episode of AMB 1 month prior wherein she could only AMB in a circle, but not in a straight line. This occurred while out walking a dog with friend, ultimately sustained a fall onto Rt hand and Rt hip, no injury sustained. Pt goes to her beach house each month and recently has felt less safe AMB on soft sand, also no longer attempts to access water alone due to imbalance.    How long can you sit comfortably? not related to this episode    How long can you stand comfortably? not related to this episode    How long can you walk comfortably? not related to this episode    Patient  Stated Goals Feel more confident in walking at the beach and be able to safely access the ocean.    Currently in Pain? No/denies             INTERVENTIONS -   In // bars with CGA throughout unless otherwise noted   Standing on Airex Pad: WBOS, EO, no UE support 30 sec WBOS, EC, 3x30 sec, intermittent UE support. Cuing to maintain head in neutral position NBOS EO 30 sec NBOS EC 4x30 sec, requires min a and UE support on occasion to regain balance.   Alternating cone taps, firm surface, x10 each side Cone taps, firm surface - all RLE x10 followed by all LLE x10 - 2 sets; focus on light taps to cone and to floor to encourage SLS   Alternating cone taps from airex pad, x10 each side Cone taps from airex pad - all RLE x10 followed by all LLE x10 - 2  sets; focus on light taps to cone and to floor to encourage SLS    Step up to airex pad placed on 6" step - 2x10 each LE -difficulty with LLE during concentric phase    Practice navigating stairs without UE support: Reciprocal pattern to ascend and descend, 4 steps x 8 reps. SUP to ascend, CGA to descend with occasional steadying. Increased lateral sway with progressive widening of BOS/step when descending.   360* turns, 5x each direction.  Pivot step to look 180* over shoulder, x8 in each direction.  Lateral stepping with GTB around ankles, length of // bars (~5 steps) x8 reps each direction. VC on eccentric control of trailing step.  Seated hip abduction with 3 second hold, x10 reps. Decreased stability within R hip.    Pt educated throughout session about proper posture and technique with exercises. Improved exercise technique, movement at target joints, use of target muscles after min to mod verbal, visual, tactile cues.     Pt demonstrates excellent motivation throughout today's session. Rotational activities initiated this session due to pt reporting increased "spinning" sensation at home when turning. No LOB or "spinning" recreated during session. Eccentric control navigating stairs was also practiced as pt reports difficulty with descending steps and desire to practice due to upcoming trip with excursions involving stairs. Airex stability exercises continued from previous sessions with continued difficulty, especially with eyes closed. During glute med strengthening, pt demo increased instability in R hip. The pt will benefit from further skilled PT to continue to improve balance in order to decrease fall risk.       PT Short Term Goals - 01/06/21 1656       PT SHORT TERM GOAL #1   Title After 4 weeks pt will demonstrate SLS balance >30sec bilat.    Baseline Eval: ~3 sec bilat; 7/19: 48 L, 24 sec R, 9/29: 12 sec R 4 sec L,    Time 4    Period Weeks    Status Partially Met     Target Date 11/11/20      PT SHORT TERM GOAL #2   Title Pt to demonstrate backwards walking gait speed >0.79ms to reveal improved sagittal plane motor control in bilat ankles.    Baseline 6/13: 0.67 m/s, 7/18: 0.7 m/s, 12/26/20: .51 m/s    Time 4    Period Weeks    Status Partially Met    Target Date 11/11/20      PT SHORT TERM GOAL #3   Title Pt to demonstrate ability to AMB in thick grass  at modI level >269f with LRAD and without LOB.    Baseline difficulty with uneven surfaces, avoids at eval: 7/18: deferred to next appointment; 7/25: pt ambulates over 200 ft in thick grass w/o LOB and no AD    Time 4    Period Weeks    Status Achieved    Target Date 11/11/20               PT Long Term Goals - 01/06/21 1656       PT LONG TERM GOAL #1   Title Pt will score 81 or greater on FOTO survey to demonstrate a sustained level of activity tolerance.    Baseline 7/18: unable to assess this session d/t server being down; 7/25: 82, 9/29:82.5 10/10: 78    Time 8    Period Weeks    Status Achieved      PT LONG TERM GOAL #2   Title Pt will demonstrate SLS balance >45 seconds shod to shoe improved ankle/trunk motorcontrol.    Baseline ~3 sec at eval; 7/18: L 48 sec, R: 24 sec    Time 8    Period Weeks    Status Partially Met    Target Date 02/20/21      PT LONG TERM GOAL #3   Title Pt to demonstrate backward walking gait speed ~ 0.947m or greater to show improved facility in ankle motor control in gait.    Baseline 6/13: 0.67 m/s; 7/18 0.7 m/s    Time 8    Period Weeks    Status Partially Met    Target Date 02/20/21      PT LONG TERM GOAL #4   Title Pt will demonstrate safe and appropriate AMB on unlevel, uneven, and soft surfaces to show independent and safe ability to access beach area ad lib.    Baseline low confidence, concerns of safety at eval; 7/18: pt felt fairly confident amb. ad lib over uneven and soft surface. Reports overal improvement with ability to ambulate  on beach. Only shows two instances of decreased stability and step strategy to regain balance; 7/25: pt demos ability to amb. on uneven, unlevel surfaces without LOB, however, reports difficulty in dim lighting    Time 8    Period Weeks    Status Achieved                   Plan - 02/05/21 1322     Clinical Impression Statement Pt demonstrates excellent motivation throughout today's session. Rotational activities initiated this session due to pt reporting increased "spinning" sensation at home when turning. No LOB or "spinning" recreated during session. Eccentric control navigating stairs was also practiced as pt reports difficulty with descending steps and desire to practice due to upcoming trip with excursions involving stairs. Airex stability exercises continued from previous sessions with continued difficulty, especially with eyes closed. During glute med strengthening, pt demo increased instability in R hip. The pt will benefit from further skilled PT to continue to improve balance in order to decrease fall risk.    Personal Factors and Comorbidities Past/Current Experience;Time since onset of injury/illness/exacerbation    Examination-Activity Limitations Locomotion Level;Stairs;Squat    Examination-Participation Restrictions Cleaning;Community Activity;Yard Work;Other    Stability/Clinical Decision Making Stable/Uncomplicated    Rehab Potential Excellent    PT Frequency 2x / week    PT Duration 8 weeks    PT Treatment/Interventions Balance training;Functional mobility training;Therapeutic activities;Therapeutic exercise;Neuromuscular re-education;Patient/family education;Gait training;Stair training;Ultrasound;Canalith Repostioning;Electrical Stimulation;DME Instruction    PT  Next Visit Plan balance on incline, decline, and banked surfaces; assess AMB in grass, ankle inv/ev measurement, balance on compliant surfaces, dynamic balance, balance activities that require increased use of  vestibular cues, tasks with EC or in dimly lit areas, continue POC    PT Home Exercise Plan Access Code: [email or web address]; no updates on this date; 7/18: reissued handout of SLS, tandem stance and slow marches for HEP with instruction to perform at support surface with chair behind her and to not perform if feeling increasingly unsteady. Pt verbalized understanding.  Updated tandem stance to progression from standard tandem stance to tandem stance with lateral and vertical head turns    Consulted and Agree with Plan of Care Patient             Patient will benefit from skilled therapeutic intervention in order to improve the following deficits and impairments:  Decreased strength, Difficulty walking, Decreased balance, Decreased coordination, Decreased activity tolerance, Decreased knowledge of use of DME, Postural dysfunction, Improper body mechanics  Visit Diagnosis: Abnormality of gait and mobility  Other lack of coordination  Difficulty in walking, not elsewhere classified  Muscle weakness (generalized)  Other abnormalities of gait and mobility  Unsteadiness on feet     Problem List Patient Active Problem List   Diagnosis Date Noted   Cataract cortical, senile 08/03/2017   Chronic colitis 08/03/2017   Medicare annual wellness visit, initial 12/15/2016   Benign essential hypertension 11/04/2016   OA (osteoarthritis) of knee 02/12/2016   Hyperlipidemia, mixed 10/24/2015   Clostridium difficile colitis 01/02/2014   Right bundle branch block 10/18/2013    Patrina Levering PT, DPT  [facility] MAIN [facility] SERVICES [address] [place], [place], [address] Phone: [phone]   Fax:  [phone]  Name: NERY KALISZ MRN: [id number] Date of Birth: 07-Oct-1934

## 2021-02-12 ENCOUNTER — Ambulatory Visit: Payer: Medicare PPO | Admitting: Physical Therapy

## 2021-02-17 ENCOUNTER — Ambulatory Visit: Payer: Medicare PPO

## 2021-02-17 ENCOUNTER — Other Ambulatory Visit: Payer: Self-pay

## 2021-02-17 DIAGNOSIS — R2681 Unsteadiness on feet: Secondary | ICD-10-CM

## 2021-02-17 DIAGNOSIS — R2689 Other abnormalities of gait and mobility: Secondary | ICD-10-CM

## 2021-02-17 NOTE — Therapy (Signed)
[place] MAIN [facility] SERVICES [address] [place], [place], [address] Phone: [phone]   Fax:  [phone]  Physical Therapy Treatment  Patient Details  Name: Kiara [name] MRN: [id number] Date of Birth: May 27, 1934 Referring Provider (PT): Emily Filbert MD (PCP)   Encounter Date: 02/17/2021   PT End of Session - 02/17/21 1650     Visit Number 28    Number of Visits 32    Date for PT Re-Evaluation 02/20/21    Authorization Type [employer] Choice PPO: authorized for 16 visits 09/09/20-11/27/20    Authorization Time Period Cert 04/04/24-12/02/83; Cert 4/62/70-3/50/09, cert/ auth 3/81-82/9    Progress Note Due on Visit 30    PT Start Time 1606    PT Stop Time 1646    PT Time Calculation (min) 40 min    Equipment Utilized During Treatment Gait belt    Activity Tolerance Patient tolerated treatment well    Behavior During Therapy WFL for tasks assessed/performed             Past Medical History:  Diagnosis Date   Arthritis    osteoarthritis -right knee.   C. difficile colitis    past,no recent issues   Colitis    Diverticulitis    Fall    "tripped"-10 days ago-scrapped right knee and lateral right brow area-" healing"   GERD (gastroesophageal reflux disease)    occ.   Seasonal allergies     Past Surgical History:  Procedure Laterality Date   APPENDECTOMY     CATARACT EXTRACTION, BILATERAL     EYE SURGERY     ? right eye retina repair.   HEMORRHOID SURGERY     KNEE ARTHROSCOPY Right 11/26/2014   Procedure: ARTHROSCOPY KNEE, Tear medical horn and anterior horn. Lateral mesicus tear, grade 3 medial;  Surgeon: Dereck Leep, MD;  Location: [facility];  Service: Orthopedics;  Laterality: Right;   MOLE REMOVAL     benign   TONSILLECTOMY     TOTAL KNEE ARTHROPLASTY Right 02/12/2016   Procedure: RIGHT TOTAL KNEE ARTHROPLASTY;  Surgeon: Gaynelle Arabian, MD;  Location: [facility];  Service: Orthopedics;  Laterality: Right;    There  were no vitals filed for this visit.   Subjective Assessment - 02/17/21 1605     Subjective Pt reports no falls or stumbles. Pt reports she has some days where she feels steady and other days where her balance is worse and she is unsure why, but does think she might feel some lightheadedness on those days. She reports today is a good day for her balance.    Pertinent History Kiara [name] is an 67yoF who comes to [place] referred by PCP for imbalance in walking. 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 Pt also had an acute onset transient episode of AMB 1 month prior wherein she could only AMB in a circle, but not in a straight line. This occurred while out walking a dog with friend, ultimately sustained a fall onto Rt hand and Rt hip, no injury sustained. Pt goes to her beach house each month and recently has felt less safe AMB on soft sand, also no longer attempts to access water alone due to imbalance.    How long can you sit comfortably? not related to this episode    How long can you stand comfortably? not related to this episode    How long can  you walk comfortably? not related to this episode    Patient Stated Goals Feel more confident in walking at the beach and be able to safely access the ocean.    Currently in Pain? No/denies           INTERVENTIONS -   In // bars with CGA throughout unless otherwise noted  Ambulation over 10 meters with vertical and horizontal head turns 6x for each; rates easy   Ambulation in hallway reading sticky note to incorporate vertical and horizontal head turns 2x length of hallway for for each. Minor increase in variability of BOS, no LOB.  Tandem walking down hallway (approx 60 ft) -  multiple instances of step strategies to regain balance due to increased variability in BOS. Rates difficult.  Semi-tandem walk 2x length of 10 meter track; pt rates as much easier  than tandem walk   360* turns 1x5 each direction. No LOB, rates as easy.  Standing on Airex Pad at balance bar: NBOS, EC - 5x30 sec; rates medium to hard, requires up to min a to regain balance along with UE support.  Semi-tandem stance 30 sec each LE --progressed to with dual task counting backwards from 100 by 5s and by 2s. Some errors in dual task. Tandem stance - 2x30 sec each LE  Stepping forward/backward over hurdle without UE support x multiple reps. Able to clear hurdle, only a couple instances of compensating with circumduction around hurdle.   SLB 2x30 sec each LE - able to achieve 30 sec of SLB on each LE --progressed to with dual cog task for 30 sec each LE: pt did exhibit decreased postural stability but maintained 30 sec on RLE and 15 sec on LLE  Discussed with pt to keep daily symptom diary to track days she feels she has poor balance and other symptoms she may be having that day. Pt verbalized understanding.   Pt educated throughout session about proper posture and technique with exercises. Improved exercise technique, movement at target joints, use of target muscles after min to mod verbal, visual, tactile cues.      PT Short Term Goals - 01/06/21 1656       PT SHORT TERM GOAL #1   Title After 4 weeks pt will demonstrate SLS balance >30sec bilat.    Baseline Eval: ~3 sec bilat; 7/19: 48 L, 24 sec R, 9/29: 12 sec R 4 sec L,    Time 4    Period Weeks    Status Partially Met    Target Date 11/11/20      PT SHORT TERM GOAL #2   Title Pt to demonstrate backwards walking gait speed >0.65ms to reveal improved sagittal plane motor control in bilat ankles.    Baseline 6/13: 0.67 m/s, 7/18: 0.7 m/s, 12/26/20: .51 m/s    Time 4    Period Weeks    Status Partially Met    Target Date 11/11/20      PT SHORT TERM GOAL #3   Title Pt to demonstrate ability to AMB in thick grass at modI level >2069fwith LRAD and without LOB.    Baseline difficulty with uneven surfaces,  avoids at eval: 7/18: deferred to next appointment; 7/25: pt ambulates over 200 ft in thick grass w/o LOB and no AD    Time 4    Period Weeks    Status Achieved    Target Date 11/11/20               PT  Long Term Goals - 01/06/21 1656       PT LONG TERM GOAL #1   Title Pt will score 81 or greater on FOTO survey to demonstrate a sustained level of activity tolerance.    Baseline 7/18: unable to assess this session d/t server being down; 7/25: 82, 9/29:82.5 10/10: 78    Time 8    Period Weeks    Status Achieved      PT LONG TERM GOAL #2   Title Pt will demonstrate SLS balance >45 seconds shod to shoe improved ankle/trunk motorcontrol.    Baseline ~3 sec at eval; 7/18: L 48 sec, R: 24 sec    Time 8    Period Weeks    Status Partially Met    Target Date 02/20/21      PT LONG TERM GOAL #3   Title Pt to demonstrate backward walking gait speed ~ 0.60ms or greater to show improved facility in ankle motor control in gait.    Baseline 6/13: 0.67 m/s; 7/18 0.7 m/s    Time 8    Period Weeks    Status Partially Met    Target Date 02/20/21      PT LONG TERM GOAL #4   Title Pt will demonstrate safe and appropriate AMB on unlevel, uneven, and soft surfaces to show independent and safe ability to access beach area ad lib.    Baseline low confidence, concerns of safety at eval; 7/18: pt felt fairly confident amb. ad lib over uneven and soft surface. Reports overal improvement with ability to ambulate on beach. Only shows two instances of decreased stability and step strategy to regain balance; 7/25: pt demos ability to amb. on uneven, unlevel surfaces without LOB, however, reports difficulty in dim lighting    Time 8    Period Weeks    Status Achieved                   Plan - 02/17/21 1651     Clinical Impression Statement Pt highly motivated to participate in session. The pt shows improvement with maintaining SLB for 30 sec on BLEs, and able to maintain 30 sec on RLE and 15  sec on LLE with addition of dual cog task. As pt reports inconsistencies day to day with her balance, PT has instructed pt to keep weekly symptom diary to track days balance is poor and days it is improved and any other symptoms pt may be experiencing. Pt verbalized understanding. While pt did show progress overall, she is still most challenged with static and dynamic tandem stance interventions.The pt will benefit from further skilled PT to continue to improve balance in order to decrease fall risk.    Personal Factors and Comorbidities Past/Current Experience;Time since onset of injury/illness/exacerbation    Examination-Activity Limitations Locomotion Level;Stairs;Squat    Examination-Participation Restrictions Cleaning;Community Activity;Yard Work;Other    Stability/Clinical Decision Making Stable/Uncomplicated    Rehab Potential Excellent    PT Frequency 2x / week    PT Duration 8 weeks    PT Treatment/Interventions Balance training;Functional mobility training;Therapeutic activities;Therapeutic exercise;Neuromuscular re-education;Patient/family education;Gait training;Stair training;Ultrasound;Canalith Repostioning;Electrical Stimulation;DME Instruction    PT Next Visit Plan balance on incline, decline, and banked surfaces; assess AMB in grass, ankle inv/ev measurement, balance on compliant surfaces, dynamic balance, balance activities that require increased use of vestibular cues, tasks with EC or in dimly lit areas, continue POC    PT Home Exercise Plan Access Code: [email or web address] no updates on this date; 7/18: reissued handout  of SLS, tandem stance and slow marches for HEP with instruction to perform at support surface with chair behind her and to not perform if feeling increasingly unsteady. Pt verbalized understanding.  Updated tandem stance to progression from standard tandem stance to tandem stance with lateral and vertical head turns; no updates    Consulted and Agree with Plan of Care Patient              Patient will benefit from skilled therapeutic intervention in order to improve the following deficits and impairments:  Decreased strength, Difficulty walking, Decreased balance, Decreased coordination, Decreased activity tolerance, Decreased knowledge of use of DME, Postural dysfunction, Improper body mechanics  Visit Diagnosis: Unsteadiness on feet  Other abnormalities of gait and mobility     Problem List Patient Active Problem List   Diagnosis Date Noted   Cataract cortical, senile 08/03/2017   Chronic colitis 08/03/2017   Medicare annual wellness visit, initial 12/15/2016   Benign essential hypertension 11/04/2016   OA (osteoarthritis) of knee 02/12/2016   Hyperlipidemia, mixed 10/24/2015   Clostridium difficile colitis 01/02/2014   Right bundle branch block 10/18/2013    Zollie Pee, PT 02/17/2021, 4:59 PM  [place] MAIN [facility] SERVICES [address] [place], [place], [address] Phone: [phone]   Fax:  [phone]  Name: Kiara [name] MRN: [id number] Date of Birth: 04-24-1934

## 2021-02-19 ENCOUNTER — Ambulatory Visit: Payer: Medicare PPO

## 2021-02-19 ENCOUNTER — Other Ambulatory Visit: Payer: Self-pay

## 2021-02-19 DIAGNOSIS — R2681 Unsteadiness on feet: Secondary | ICD-10-CM

## 2021-02-19 NOTE — Therapy (Signed)
[place] MAIN [facility] SERVICES [address] [place], [place], [address] Phone: [phone]   Fax:  [phone]  Physical Therapy Treatment  Patient Details  Name: Kiara [name] MRN: [id number] Date of Birth: [birth year] Referring Provider (PT): Emily Filbert MD (PCP)   Encounter Date: 02/19/2021   PT End of Session - 02/19/21 1017     Visit Number 29    Number of Visits 32    Date for PT Re-Evaluation 02/20/21    Authorization Type [employer] Choice PPO: authorized for 16 visits 09/09/20-11/27/20    Authorization Time Period Cert 10/03/84-10/02/42; Cert 12/17/08-0/71/21, cert/ auth 9/75-88/3    Progress Note Due on Visit 30    PT Start Time 0931    PT Stop Time 1013    PT Time Calculation (min) 42 min    Equipment Utilized During Treatment Gait belt    Activity Tolerance Patient tolerated treatment well    Behavior During Therapy WFL for tasks assessed/performed             Past Medical History:  Diagnosis Date   Arthritis    osteoarthritis -right knee.   C. difficile colitis    past,no recent issues   Colitis    Diverticulitis    Fall    "tripped"-10 days ago-scrapped right knee and lateral right brow area-" healing"   GERD (gastroesophageal reflux disease)    occ.   Seasonal allergies     Past Surgical History:  Procedure Laterality Date   APPENDECTOMY     CATARACT EXTRACTION, BILATERAL     EYE SURGERY     ? right eye retina repair.   HEMORRHOID SURGERY     KNEE ARTHROSCOPY Right 11/26/2014   Procedure: ARTHROSCOPY KNEE, Tear medical horn and anterior horn. Lateral mesicus tear, grade 3 medial;  Surgeon: Dereck Leep, MD;  Location: [facility] ORS;  Service: Orthopedics;  Laterality: Right;   MOLE REMOVAL     benign   TONSILLECTOMY     TOTAL KNEE ARTHROPLASTY Right 02/12/2016   Procedure: RIGHT TOTAL KNEE ARTHROPLASTY;  Surgeon: Gaynelle Arabian, MD;  Location: [facility];  Service: Orthopedics;  Laterality: Right;    There  were no vitals filed for this visit.    Subjective Assessment - 02/19/21 0930     Subjective Pt reports she has been OK and no issues with balance. Pt reports no other major changes.    Pertinent History Kiara [name] is an 52yoF who comes to [place] referred by PCP for imbalance in walking. 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 Pt also had an acute onset transient episode of AMB 1 month prior wherein she could only AMB in a circle, but not in a straight line. This occurred while out walking a dog with friend, ultimately sustained a fall onto Rt hand and Rt hip, no injury sustained. Pt goes to her beach house each month and recently has felt less safe AMB on soft sand, also no longer attempts to access water alone due to imbalance.    How long can you sit comfortably? not related to this episode    How long can you stand comfortably? not related to this episode    How long can you walk comfortably? not related to this episode    Patient Stated Goals Feel more confident in walking at the beach and be able to safely access the ocean.  Currently in Pain? No/denies             INTERVENTIONS -   Interventions performed with CGA throughout unless otherwise noted  Standing on Airex Pad at balance bar: NBOS, EC - 4x30 sec; requires up to min a to regain balance along with UE support. Progressed to 2x30 sec with cog dual task.   Tandem stance  2x30 sec each LE; intermittent UE support and up to min a required SLB 30 sec each LE, no decrease in postural stability  Ambulation over 10 meters with vertical and horizontal head turns 2x for each; rates easy  --progressed to with dual task 2x for each, increased errors, variability in BOS  KoreBalance to promote ankle and hip strategies, reactive and anticipatory postural control Tux Racer 4x SDL Ball 3x Performed with decreasing levels of UE support. Pt  rates as difficult. Difficulty not lifting feet and taking steps to correct  Semi-tandem walk 2x length of 10 meter track;  Tandem walk 2x length length of 10 meter track: pt frequently uses step strategy   Standing NBOS on airex ankle rockers 2x10, with intermittent UE support; pt exhibits decreased DF ability  Standing calf stretch 30 sec each LE   Pt educated throughout session about proper posture and technique with exercises. Improved exercise technique, movement at target joints, use of target muscles after min to mod verbal, visual, tactile cues.  Assessment: Pt continues to demonstrate improved balance maintaining SLB with little decrease in postural stability B. The pt did exhibit some calf tightness when attempting ankle rocker exercises, limiting her DF. Continued stretching for gastrocs should be focused on in future sessions to promote increased use of ankle strategies. The pt will benefit from further skilled PT to continue to improve mobility and balance to decrease fall risk.          PT Education - 02/19/21 1017     Education Details exercise technique    Person(s) Educated Patient    Methods Explanation;Demonstration;Verbal cues    Comprehension Verbalized understanding;Returned demonstration;Verbal cues required;Need further instruction              PT Short Term Goals - 01/06/21 1656       PT SHORT TERM GOAL #1   Title After 4 weeks pt will demonstrate SLS balance >30sec bilat.    Baseline Eval: ~3 sec bilat; 7/19: 48 L, 24 sec R, 9/29: 12 sec R 4 sec L,    Time 4    Period Weeks    Status Partially Met    Target Date 11/11/20      PT SHORT TERM GOAL #2   Title Pt to demonstrate backwards walking gait speed >0.77ms to reveal improved sagittal plane motor control in bilat ankles.    Baseline 6/13: 0.67 m/s, 7/18: 0.7 m/s, 12/26/20: .51 m/s    Time 4    Period Weeks    Status Partially Met    Target Date 11/11/20      PT SHORT TERM GOAL #3    Title Pt to demonstrate ability to AMB in thick grass at modI level >206fwith LRAD and without LOB.    Baseline difficulty with uneven surfaces, avoids at eval: 7/18: deferred to next appointment; 7/25: pt ambulates over 200 ft in thick grass w/o LOB and no AD    Time 4    Period Weeks    Status Achieved    Target Date 11/11/20  PT Long Term Goals - 01/06/21 1656       PT LONG TERM GOAL #1   Title Pt will score 81 or greater on FOTO survey to demonstrate a sustained level of activity tolerance.    Baseline 7/18: unable to assess this session d/t server being down; 7/25: 82, 9/29:82.5 10/10: 78    Time 8    Period Weeks    Status Achieved      PT LONG TERM GOAL #2   Title Pt will demonstrate SLS balance >45 seconds shod to shoe improved ankle/trunk motorcontrol.    Baseline ~3 sec at eval; 7/18: L 48 sec, R: 24 sec    Time 8    Period Weeks    Status Partially Met    Target Date 02/20/21      PT LONG TERM GOAL #3   Title Pt to demonstrate backward walking gait speed ~ 0.36ms or greater to show improved facility in ankle motor control in gait.    Baseline 6/13: 0.67 m/s; 7/18 0.7 m/s    Time 8    Period Weeks    Status Partially Met    Target Date 02/20/21      PT LONG TERM GOAL #4   Title Pt will demonstrate safe and appropriate AMB on unlevel, uneven, and soft surfaces to show independent and safe ability to access beach area ad lib.    Baseline low confidence, concerns of safety at eval; 7/18: pt felt fairly confident amb. ad lib over uneven and soft surface. Reports overal improvement with ability to ambulate on beach. Only shows two instances of decreased stability and step strategy to regain balance; 7/25: pt demos ability to amb. on uneven, unlevel surfaces without LOB, however, reports difficulty in dim lighting    Time 8    Period Weeks    Status Achieved                    Patient will benefit from skilled therapeutic intervention in  order to improve the following deficits and impairments:     Visit Diagnosis: Unsteadiness on feet     Problem List Patient Active Problem List   Diagnosis Date Noted   Cataract cortical, senile 08/03/2017   Chronic colitis 08/03/2017   Medicare annual wellness visit, initial 12/15/2016   Benign essential hypertension 11/04/2016   OA (osteoarthritis) of knee 02/12/2016   Hyperlipidemia, mixed 10/24/2015   Clostridium difficile colitis 01/02/2014   Right bundle branch block 10/18/2013    HZollie Pee PT 02/19/2021, 5:05 PM  [facility] [address] [place] [address]Phone: [phone]  Fax:  [phone] Name: Kiara AUSTADMRN: [id number] of Birth: 7Sep 16, 1936

## 2021-02-24 ENCOUNTER — Ambulatory Visit: Payer: Medicare PPO

## 2021-02-26 ENCOUNTER — Ambulatory Visit: Payer: Medicare PPO

## 2021-03-03 ENCOUNTER — Ambulatory Visit: Payer: Medicare PPO | Admitting: Physical Therapy

## 2021-03-05 ENCOUNTER — Other Ambulatory Visit: Payer: Self-pay

## 2021-03-05 ENCOUNTER — Ambulatory Visit: Payer: Medicare PPO | Attending: Neurology | Admitting: Physical Therapy

## 2021-03-05 ENCOUNTER — Ambulatory Visit: Payer: Medicare PPO | Admitting: Physical Therapy

## 2021-03-05 DIAGNOSIS — R269 Unspecified abnormalities of gait and mobility: Secondary | ICD-10-CM

## 2021-03-05 DIAGNOSIS — R262 Difficulty in walking, not elsewhere classified: Secondary | ICD-10-CM

## 2021-03-05 DIAGNOSIS — R2681 Unsteadiness on feet: Secondary | ICD-10-CM | POA: Diagnosis not present

## 2021-03-05 DIAGNOSIS — R2689 Other abnormalities of gait and mobility: Secondary | ICD-10-CM

## 2021-03-05 NOTE — Therapy (Signed)
[place] MAIN [facility] SERVICES [address] [place], [place], [address] Phone: [phone]   Fax:  [phone]  Physical Therapy Treatment/Physical Therapy Progress Note   Dates of reporting period  01/06/21   to   03/05/21   Patient Details  Name: Kiara [name] MRN: [id number] Date of Birth: [birth year] Referring Provider (PT): Kiara Filbert MD (PCP)   Encounter Date: 03/05/2021   PT End of Session - 03/05/21 1716     Visit Number 30    Number of Visits 32    Date for PT Re-Evaluation 02/20/21    Authorization Type [employer] Choice PPO: authorized for 16 visits 09/09/20-11/27/20    Authorization Time Period Cert 4/94/49-09/03/57; Cert 1/63/84-6/65/99, cert/ auth 3/57-01/7    Progress Note Due on Visit 30    PT Start Time 1308    PT Stop Time 1344    PT Time Calculation (min) 36 min    Equipment Utilized During Treatment Gait belt    Activity Tolerance Patient tolerated treatment well    Behavior During Therapy WFL for tasks assessed/performed             Past Medical History:  Diagnosis Date   Arthritis    osteoarthritis -right knee.   C. difficile colitis    past,no recent issues   Colitis    Diverticulitis    Fall    "tripped"-10 days ago-scrapped right knee and lateral right brow area-" healing"   GERD (gastroesophageal reflux disease)    occ.   Seasonal allergies     Past Surgical History:  Procedure Laterality Date   APPENDECTOMY     CATARACT EXTRACTION, BILATERAL     EYE SURGERY     ? right eye retina repair.   HEMORRHOID SURGERY     KNEE ARTHROSCOPY Right 11/26/2014   Procedure: ARTHROSCOPY KNEE, Tear medical horn and anterior horn. Lateral mesicus tear, grade 3 medial;  Surgeon: Dereck Leep, MD;  Location: [facility] ORS;  Service: Orthopedics;  Laterality: Right;   MOLE REMOVAL     benign   TONSILLECTOMY     TOTAL KNEE ARTHROPLASTY Right 02/12/2016   Procedure: RIGHT TOTAL KNEE ARTHROPLASTY;  Surgeon:  Gaynelle Arabian, MD;  Location: [facility];  Service: Orthopedics;  Laterality: Right;    There were no vitals filed for this visit.   Subjective Assessment - 03/05/21 1310     Subjective Pt reports she has been sick with COVID and was not able to complete her exercises. Pt describes dry cough, fatigue, and headache with COVID and now only some fatigue is lingering. Pt reports no falls or LOB. Pt reports her balance has improved and she went to [place] for her grandaudgters graduation and report min difficulty with high level tasks.    Pertinent History Kiara [name] is an 48yoF who comes to [place] referred by PCP for imbalance in walking. 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 Pt also had an acute onset transient episode of AMB 1 month prior wherein she could only AMB in a circle, but not in a straight line. This occurred while out walking a dog with friend, ultimately sustained a fall onto Rt hand and Rt hip, no injury sustained. Pt goes to her beach house each month and recently has felt less safe AMB on soft sand, also no longer attempts to access water alone due to imbalance.    How  long can you sit comfortably? not related to this episode    How long can you stand comfortably? not related to this episode    How long can you walk comfortably? not related to this episode    Patient Stated Goals Feel more confident in walking at the beach and be able to safely access the ocean.                [facility] PT Assessment - 03/05/21 0001       Standardized Balance Assessment   Standardized Balance Assessment Dynamic Gait Index      Dynamic Gait Index   Level Surface Normal    Change in Gait Speed Normal    Gait with Horizontal Head Turns Mild Impairment    Gait with Vertical Head Turns Mild Impairment    Gait and Pivot Turn Normal    Step Over Obstacle Mild Impairment    Step Around Obstacles Mild  Impairment    Steps Mild Impairment    Total Score 19    DGI comment: hit cone whan ambulating, stepped around box with trailing LE.            Physical therapy treatment session today consisted of completing assessment of goals and administration of testing as demonstrated in flow sheet. Addition treatments may be found below.     Exercise/Activity Sets/Reps/Time/ Resistance Assistance Charge type Comments  Cone pick ups  X 4   neuro Squats with each cone in order to imitate picking up after dog at home   SLS X 5 attempts ea LE   Neuro re ed  Best on L 19 sec  best on R 25 sec   DGI   Neuro re ed  See flowsheet   Retro ambulation  X 4 trials x 10 meters   Neuro re ed  See goals, best speed .65ms                                            Treatment Provided this session   Pt educated throughout session about proper posture and technique with exercises. Improved exercise technique, movement at target joints, use of target muscles after min to mod verbal, visual, tactile cues. Note: Portions of this document were prepared using Dragon voice recognition software and although reviewed may contain unintentional dictation errors in syntax, grammar, or spelling.                       PT Education - 03/05/21 1313     Education Details Progress    Person(s) Educated Patient    Methods Explanation;Demonstration    Comprehension Verbalized understanding;Verbal cues required              PT Short Term Goals - 03/05/21 1318       PT SHORT TERM GOAL #1   Title After 4 weeks pt will demonstrate SLS balance >30sec bilat.    Baseline Eval: ~3 sec bilat; 7/19: 48 L, 24 sec R, 9/29: 12 sec R 4 sec L,12/7: 19 on L, 24 on R    Time 4    Period Weeks    Status Partially Met    Target Date 04/02/21      PT SHORT TERM GOAL #2   Title Pt to demonstrate backwards walking gait speed >0.762m to reveal improved sagittal plane motor control in  bilat ankles.     Baseline 6/13: 0.67 m/s, 7/18: 0.7 m/s, 12/26/20: .51 m/s 12/7: .76ms    Time 4    Period Weeks    Status Achieved    Target Date 11/11/20      PT SHORT TERM GOAL #3   Title Pt to demonstrate ability to AMB in thick grass at modI level >2059fwith LRAD and without LOB.    Baseline difficulty with uneven surfaces, avoids at eval: 7/18: deferred to next appointment; 7/25: pt ambulates over 200 ft in thick grass w/o LOB and no AD    Time 4    Period Weeks    Status Achieved    Target Date 11/11/20               PT Long Term Goals - 03/05/21 1322       PT LONG TERM GOAL #1   Title Pt will score 81 or greater on FOTO survey to demonstrate a sustained level of activity tolerance.    Baseline 7/18: unable to assess this session d/t server being down; 7/25: 82, 9/29:82.5 10/10: 78    Time 8    Period Weeks    Status Achieved      PT LONG TERM GOAL #2   Title Pt will demonstrate SLS balance >45 seconds shod to shoe improved ankle/trunk motorcontrol.    Baseline ~3 sec at eval; 7/18: L 48 sec, R: 24 sec 12/7: L 19 s, R 25 sec    Time 8    Period Weeks    Status Partially Met    Target Date 02/20/21      PT LONG TERM GOAL #3   Title Pt to demonstrate backward walking gait speed ~ 0.1m56mor greater to show improved facility in ankle motor control in gait.    Baseline 6/13: 0.67 m/s; 7/18 0.7 m/s12/7- .88m70m  Time 8    Period Weeks    Status Partially Met    Target Date 02/20/21      PT LONG TERM GOAL #4   Title Pt will demonstrate safe and appropriate AMB on unlevel, uneven, and soft surfaces to show independent and safe ability to access beach area ad lib.    Baseline low confidence, concerns of safety at eval; 7/18: pt felt fairly confident amb. ad lib over uneven and soft surface. Reports overal improvement with ability to ambulate on beach. Only shows two instances of decreased stability and step strategy to regain balance; 7/25: pt demos ability to amb. on uneven, unlevel  surfaces without LOB, however, reports difficulty in dim lighting    Time 8    Period Weeks    Status Achieved      PT LONG TERM GOAL #5   Title Pt will ascend and descend 5 steps without UE assistance in order to improve ability to enter and exit her daughter's house.    Baseline Pt utilized UE ascending and descending steps.    Time 8    Period Weeks    Status New    Target Date 04/30/21      Additional Long Term Goals   Additional Long Term Goals Yes      PT LONG TERM GOAL #6   Title Pt will improve DGI by 3 points or more in order to indicate ipmroved balance and decreased risk of falls    Baseline DGI 19    Time 8    Period Weeks    Status New  Target Date 04/30/21                   Plan - 03/05/21 1717     Clinical Impression Statement Patient presents to therapy for a progress note following 29 physical therapy visits.  Patient has made significant improvements with her balance but does continue to note occasional loss of balance particularly at night when managing her dog on uneven surfaces.  Patient also reports increase difficulty with squatting activities leading to cleaning up after her dog reports frequent imbalance with these activities.  Patient has made significant progress with her ability to ambulate backwards indicating improved dynamic stability and balance.  Patient does however continue to demonstrate decreased ability to maintain single-leg stance on bilateral lower extremities.  Patient also demonstrates risk of falls due to her dynamic gait index score.  In addition to this patient has difficulty with navigating stairs without utilizing rails which poses a safety risk when she enters and exits her daughter's house.  Patient will continue to benefit from skilled physical therapy intervention in order to improve her balance, reduce her risk and frequency of falls, improve her overall quality of life and safety. Patient's condition has the potential to  improve in response to therapy. Maximum improvement is yet to be obtained. The anticipated improvement is attainable and reasonable in a generally predictable time.      Personal Factors and Comorbidities Past/Current Experience;Time since onset of injury/illness/exacerbation    Examination-Activity Limitations Locomotion Level;Stairs;Squat    Examination-Participation Restrictions Cleaning;Community Activity;Yard Work;Other    Stability/Clinical Decision Making Stable/Uncomplicated    Rehab Potential Excellent    PT Frequency 2x / week    PT Duration 8 weeks    PT Treatment/Interventions Balance training;Functional mobility training;Therapeutic activities;Therapeutic exercise;Neuromuscular re-education;Patient/family education;Gait training;Stair training;Ultrasound;Canalith Repostioning;Electrical Stimulation;DME Instruction    PT Next Visit Plan balance on incline, decline, and banked surfaces; assess AMB in grass, ankle inv/ev measurement, balance on compliant surfaces, dynamic balance, balance activities that require increased use of vestibular cues, tasks with EC or in dimly lit areas, continue POC    PT Home Exercise Plan Access Code: [email or web address]; no updates on this date; 7/18: reissued handout of SLS, tandem stance and slow marches for HEP with instruction to perform at support surface with chair behind her and to not perform if feeling increasingly unsteady. Pt verbalized understanding.  Updated tandem stance to progression from standard tandem stance to tandem stance with lateral and vertical head turns; no updates    Consulted and Agree with Plan of Care Patient             Patient will benefit from skilled therapeutic intervention in order to improve the following deficits and impairments:  Decreased strength, Difficulty walking, Decreased balance, Decreased coordination, Decreased activity tolerance, Decreased knowledge of use of DME, Postural dysfunction, Improper body  mechanics  Visit Diagnosis: Unsteadiness on feet  Other abnormalities of gait and mobility  Abnormality of gait and mobility  Difficulty in walking, not elsewhere classified     Problem List Patient Active Problem List   Diagnosis Date Noted   Cataract cortical, senile 08/03/2017   Chronic colitis 08/03/2017   Medicare annual wellness visit, initial 12/15/2016   Benign essential hypertension 11/04/2016   OA (osteoarthritis) of knee 02/12/2016   Hyperlipidemia, mixed 10/24/2015   Clostridium difficile colitis 01/02/2014   Right bundle branch block 10/18/2013    Particia Lather, PT 03/05/2021, 5:26 PM  [place]  [place], [place], [address] Phone: [phone]   Fax:  [phone]  Name: Kiara [name] MRN: [id number] Date of Birth: June 24, 1934

## 2021-03-10 ENCOUNTER — Ambulatory Visit: Payer: Medicare PPO

## 2021-03-12 ENCOUNTER — Ambulatory Visit: Payer: Medicare PPO

## 2021-03-12 NOTE — Addendum Note (Signed)
Addended by: Rivka Annikah B on: 03/12/2021 08:59 AM   Modules accepted: Orders

## 2021-03-18 ENCOUNTER — Other Ambulatory Visit: Payer: Self-pay

## 2021-03-18 ENCOUNTER — Ambulatory Visit: Payer: Medicare PPO | Admitting: Physical Therapy

## 2021-03-18 DIAGNOSIS — R269 Unspecified abnormalities of gait and mobility: Secondary | ICD-10-CM

## 2021-03-18 DIAGNOSIS — R2681 Unsteadiness on feet: Secondary | ICD-10-CM | POA: Diagnosis not present

## 2021-03-18 DIAGNOSIS — R2689 Other abnormalities of gait and mobility: Secondary | ICD-10-CM

## 2021-03-18 DIAGNOSIS — R262 Difficulty in walking, not elsewhere classified: Secondary | ICD-10-CM

## 2021-03-18 NOTE — Therapy (Signed)
[place] MAIN [facility] SERVICES [address] [place], [place], [address] Phone: [phone]   Fax:  [phone]  Physical Therapy Treatment  Patient Details  Name: Kiara [name] MRN: [id number] Date of Birth: 1934/09/12 Referring Provider (PT): Emily Filbert MD (PCP)   Encounter Date: 03/18/2021   PT End of Session - 03/18/21 1249     Visit Number 31    Number of Visits 32    Authorization Type [employer] Choice PPO: authorized for 16 visits 09/09/20-11/27/20    Authorization Time Period Cert 09/24/29-07/29/74; Cert 1/60/73-10/06/60, cert/ auth [id number]/94-85/[id number]    Progress Note Due on Visit 40    PT Start Time 1147    PT Stop Time 1230    PT Time Calculation (min) 43 min    Equipment Utilized During Treatment Gait belt    Activity Tolerance Patient tolerated treatment well    Behavior During Therapy WFL for tasks assessed/performed             Past Medical History:  Diagnosis Date   Arthritis    osteoarthritis -right knee.   C. difficile colitis    past,no recent issues   Colitis    Diverticulitis    Fall    "tripped"-10 days ago-scrapped right knee and lateral right brow area-" healing"   GERD (gastroesophageal reflux disease)    occ.   Seasonal allergies     Past Surgical History:  Procedure Laterality Date   APPENDECTOMY     CATARACT EXTRACTION, BILATERAL     EYE SURGERY     ? right eye retina repair.   HEMORRHOID SURGERY     KNEE ARTHROSCOPY Right 11/26/2014   Procedure: ARTHROSCOPY KNEE, Tear medical horn and anterior horn. Lateral mesicus tear, grade 3 medial;  Surgeon: Dereck Leep, MD;  Location: [facility] ORS;  Service: Orthopedics;  Laterality: Right;   MOLE REMOVAL     benign   TONSILLECTOMY     TOTAL KNEE ARTHROPLASTY Right 02/12/2016   Procedure: RIGHT TOTAL KNEE ARTHROPLASTY;  Surgeon: Gaynelle Arabian, MD;  Location: [facility];  Service: Orthopedics;  Laterality: Right;    There were no vitals filed for this  visit.   Subjective Assessment - 03/18/21 1245     Subjective Pt reports no falls or lob since last session. reports she feels ready for discharge with hep.    Pertinent History Kiara [name] is an 34yoF who comes to [place] referred by PCP for imbalance in walking. 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 Pt also had an acute onset transient episode of AMB 1 month prior wherein she could only AMB in a circle, but not in a straight line. This occurred while out walking a dog with friend, ultimately sustained a fall onto Rt hand and Rt hip, no injury sustained. Pt goes to her beach house each month and recently has felt less safe AMB on soft sand, also no longer attempts to access water alone due to imbalance.    How long can you sit comfortably? not related to this episode    How long can you stand comfortably? not related to this episode    How long can you walk comfortably? not related to this episode    Patient Stated Goals Feel more confident in walking at the beach and be able to safely access the ocean.    Currently in Pain? No/denies  Treatment provided this session    Neuro Re- Ed:   Tandem balance 2 x 30 sec: mod sway noted and utilization of hip and ankle strategies   SLS x 30 sec ea LE: UE utilization required to prevent LOB   Staggered stance EC: unable to maintain balance without frequent UE support x 30 sec   Rhomberg EC: min sway and occassional UE use required. 2 x 30 sec   HR/toe raise, 2 x 10, min LOB noted with heel rocking, instructed to utilize UE as needed   Kore Balance to improve hip and ankle stretegy utilization,  Golf balance game x 9 holes  - good ankle strategy utilization  -min UE use         Pt educated throughout session about proper posture and technique with exercises. Improved exercise technique, movement at target joints, use of target  muscles after min to mod verbal, visual, tactile cues.                           PT Education - 03/18/21 1248     Education Details HEP update    Person(s) Educated Patient    Methods Explanation;Handout    Comprehension Verbalized understanding;Returned demonstration              PT Short Term Goals - 03/05/21 1318       PT SHORT TERM GOAL #1   Title After 4 weeks pt will demonstrate SLS balance >30sec bilat.    Baseline Eval: ~3 sec bilat; 7/19: 48 L, 24 sec R, 9/29: 12 sec R 4 sec L,12/7: 19 on L, 24 on R    Time 4    Period Weeks    Status Partially Met    Target Date 04/02/21      PT SHORT TERM GOAL #2   Title Pt to demonstrate backwards walking gait speed >0.31ms to reveal improved sagittal plane motor control in bilat ankles.    Baseline 6/13: 0.67 m/s, 7/18: 0.7 m/s, 12/26/20: .51 m/s 12/7: .964m    Time 4    Period Weeks    Status Achieved    Target Date 11/11/20      PT SHORT TERM GOAL #3   Title Pt to demonstrate ability to AMB in thick grass at modI level >2009fith LRAD and without LOB.    Baseline difficulty with uneven surfaces, avoids at eval: 7/18: deferred to next appointment; 7/25: pt ambulates over 200 ft in thick grass w/o LOB and no AD    Time 4    Period Weeks    Status Achieved    Target Date 11/11/20               PT Long Term Goals - 03/05/21 1322       PT LONG TERM GOAL #1   Title Pt will score 81 or greater on FOTO survey to demonstrate a sustained level of activity tolerance.    Baseline 7/18: unable to assess this session d/t server being down; 7/25: 82, 9/29:82.5 10/10: 78    Time 8    Period Weeks    Status Achieved      PT LONG TERM GOAL #2   Title Pt will demonstrate SLS balance >45 seconds shod to shoe improved ankle/trunk motorcontrol.    Baseline ~3 sec at eval; 7/18: L 48 sec, R: 24 sec 12/7: L 19 s, R 25 sec    Time 8  Period Weeks    Status Partially Met    Target Date 02/20/21       PT LONG TERM GOAL #3   Title Pt to demonstrate backward walking gait speed ~ 0.76ms or greater to show improved facility in ankle motor control in gait.    Baseline 6/13: 0.67 m/s; 7/18 0.7 m/s12/7- .962m    Time 8    Period Weeks    Status Partially Met    Target Date 02/20/21      PT LONG TERM GOAL #4   Title Pt will demonstrate safe and appropriate AMB on unlevel, uneven, and soft surfaces to show independent and safe ability to access beach area ad lib.    Baseline low confidence, concerns of safety at eval; 7/18: pt felt fairly confident amb. ad lib over uneven and soft surface. Reports overal improvement with ability to ambulate on beach. Only shows two instances of decreased stability and step strategy to regain balance; 7/25: pt demos ability to amb. on uneven, unlevel surfaces without LOB, however, reports difficulty in dim lighting    Time 8    Period Weeks    Status Achieved      PT LONG TERM GOAL #5   Title Pt will ascend and descend 5 steps without UE assistance in order to improve ability to enter and exit her daughter's house.    Baseline Pt utilized UE ascending and descending steps.    Time 8    Period Weeks    Status New    Target Date 04/30/21      Additional Long Term Goals   Additional Long Term Goals Yes      PT LONG TERM GOAL #6   Title Pt will improve DGI by 3 points or more in order to indicate ipmroved balance and decreased risk of falls    Baseline DGI 19    Time 8    Period Weeks    Status New    Target Date 04/30/21                   Plan - 03/18/21 1249     Clinical Impression Statement Pt presents for final PT session to prepare for d/c with HEP. Pt has not had any recent falls and she only feels happy with her current level of function and progress. Pt reviewed all of her HEP and was progresed with several new exercises. Pt will now be discharged from formal physical therapy at this time and was instructed to continue with HEP and  contact clinic if she has any issues in the future.    Personal Factors and Comorbidities Past/Current Experience;Time since onset of injury/illness/exacerbation    Examination-Activity Limitations Locomotion Level;Stairs;Squat    Examination-Participation Restrictions Cleaning;Community Activity;Yard Work;Other    Stability/Clinical Decision Making Stable/Uncomplicated    Rehab Potential Excellent    PT Frequency 2x / week    PT Duration 8 weeks    PT Treatment/Interventions Balance training;Functional mobility training;Therapeutic activities;Therapeutic exercise;Neuromuscular re-education;Patient/family education;Gait training;Stair training;Ultrasound;Canalith Repostioning;Electrical Stimulation;DME Instruction    PT Next Visit Plan balance on incline, decline, and banked surfaces; assess AMB in grass, ankle inv/ev measurement, balance on compliant surfaces, dynamic balance, balance activities that require increased use of vestibular cues, tasks with EC or in dimly lit areas, continue POC    PT Home Exercise Plan Access Code: [email or web address] updates on this date; 7/18: reissued handout of SLS, tandem stance and slow marches for HEP with instruction to perform at support  surface with chair behind her and to not perform if feeling increasingly unsteady. Pt verbalized understanding.  Updated tandem stance to progression from standard tandem stance to tandem stance with lateral and vertical head turns; no updates    Consulted and Agree with Plan of Care Patient             Patient will benefit from skilled therapeutic intervention in order to improve the following deficits and impairments:  Decreased strength, Difficulty walking, Decreased balance, Decreased coordination, Decreased activity tolerance, Decreased knowledge of use of DME, Postural dysfunction, Improper body mechanics  Visit Diagnosis: Unsteadiness on feet  Other abnormalities of gait and mobility  Abnormality of gait and  mobility  Difficulty in walking, not elsewhere classified     Problem List Patient Active Problem List   Diagnosis Date Noted   Cataract cortical, senile 08/03/2017   Chronic colitis 08/03/2017   Medicare annual wellness visit, initial 12/15/2016   Benign essential hypertension 11/04/2016   OA (osteoarthritis) of knee 02/12/2016   Hyperlipidemia, mixed 10/24/2015   Clostridium difficile colitis 01/02/2014   Right bundle branch block 10/18/2013    Particia Lather, PT 03/18/2021, 1:01 PM  [place] MAIN [facility] SERVICES [address] [place], [place], [address] Phone: [phone]   Fax:  [phone]  Name: CHANIA KOCHANSKI MRN: [id number] Date of Birth: 15-Apr-1934

## 2021-03-20 ENCOUNTER — Ambulatory Visit: Payer: Medicare PPO | Admitting: Physical Therapy

## 2021-03-26 ENCOUNTER — Ambulatory Visit: Payer: Medicare PPO | Admitting: Physical Therapy

## 2021-03-28 ENCOUNTER — Ambulatory Visit: Payer: Medicare PPO | Admitting: Physical Therapy

## 2021-06-04 ENCOUNTER — Other Ambulatory Visit: Payer: Self-pay | Admitting: Internal Medicine

## 2021-06-04 DIAGNOSIS — R1084 Generalized abdominal pain: Secondary | ICD-10-CM

## 2021-06-04 DIAGNOSIS — R634 Abnormal weight loss: Secondary | ICD-10-CM

## 2021-06-23 ENCOUNTER — Other Ambulatory Visit: Payer: Self-pay | Admitting: Internal Medicine

## 2021-06-23 DIAGNOSIS — Z1231 Encounter for screening mammogram for malignant neoplasm of breast: Secondary | ICD-10-CM

## 2021-06-26 ENCOUNTER — Other Ambulatory Visit: Payer: Self-pay

## 2021-06-26 ENCOUNTER — Ambulatory Visit
Admission: RE | Admit: 2021-06-26 | Discharge: 2021-06-26 | Disposition: A | Discharge status: Home / Self Care | Payer: Medicare PPO | Source: Ambulatory Visit | Attending: Internal Medicine | Admitting: Internal Medicine

## 2021-06-26 DIAGNOSIS — R634 Abnormal weight loss: Secondary | ICD-10-CM | POA: Diagnosis present

## 2021-06-26 DIAGNOSIS — R1084 Generalized abdominal pain: Secondary | ICD-10-CM | POA: Diagnosis not present

## 2021-06-26 LAB — POCT I-STAT CREATININE: Creatinine, Ser: 0.8 mg/dL (ref 0.44–1.00)

## 2021-06-26 MED ORDER — IOHEXOL 300 MG/ML  SOLN
85.0000 mL | Freq: Once | INTRAMUSCULAR | Status: AC | PRN
  Administered 2021-06-26: 85 mL via INTRAVENOUS

## 2021-07-15 ENCOUNTER — Other Ambulatory Visit (HOSPITAL_COMMUNITY): Payer: Self-pay | Admitting: Student

## 2021-07-15 ENCOUNTER — Other Ambulatory Visit: Payer: Self-pay | Admitting: Student

## 2021-07-15 DIAGNOSIS — R4189 Other symptoms and signs involving cognitive functions and awareness: Secondary | ICD-10-CM

## 2021-08-01 ENCOUNTER — Ambulatory Visit (HOSPITAL_COMMUNITY)
Admission: RE | Admit: 2021-08-01 | Discharge: 2021-08-01 | Disposition: A | Discharge status: Home / Self Care | Payer: Medicare PPO | Source: Ambulatory Visit | Attending: Student | Admitting: Student

## 2021-08-01 DIAGNOSIS — R4189 Other symptoms and signs involving cognitive functions and awareness: Secondary | ICD-10-CM | POA: Insufficient documentation

## 2021-08-19 ENCOUNTER — Ambulatory Visit
Admission: RE | Admit: 2021-08-19 | Discharge: 2021-08-19 | Disposition: A | Discharge status: Home / Self Care | Payer: Medicare PPO | Source: Ambulatory Visit | Attending: Internal Medicine | Admitting: Internal Medicine

## 2021-08-19 DIAGNOSIS — Z1231 Encounter for screening mammogram for malignant neoplasm of breast: Secondary | ICD-10-CM | POA: Diagnosis present

## 2021-12-02 ENCOUNTER — Other Ambulatory Visit: Payer: Self-pay | Admitting: Internal Medicine

## 2021-12-02 ENCOUNTER — Ambulatory Visit
Admission: RE | Admit: 2021-12-02 | Discharge: 2021-12-02 | Disposition: A | Discharge status: Home / Self Care | Payer: Medicare PPO | Source: Ambulatory Visit | Attending: Internal Medicine | Admitting: Internal Medicine

## 2021-12-02 DIAGNOSIS — R1084 Generalized abdominal pain: Secondary | ICD-10-CM | POA: Diagnosis not present

## 2021-12-02 LAB — POCT I-STAT CREATININE: Creatinine, Ser: 0.8 mg/dL (ref 0.44–1.00)

## 2021-12-02 MED ORDER — IOHEXOL 300 MG/ML  SOLN
100.0000 mL | Freq: Once | INTRAMUSCULAR | Status: AC | PRN
  Administered 2021-12-02: 100 mL via INTRAVENOUS

## 2022-02-02 DIAGNOSIS — G3184 Mild cognitive impairment, so stated: Secondary | ICD-10-CM | POA: Insufficient documentation

## 2022-05-27 ENCOUNTER — Ambulatory Visit: Payer: Medicare PPO | Admitting: Podiatry

## 2022-06-02 ENCOUNTER — Encounter: Payer: Self-pay | Admitting: Podiatry

## 2022-06-02 ENCOUNTER — Ambulatory Visit: Payer: Medicare PPO | Admitting: Podiatry

## 2022-06-02 DIAGNOSIS — D2371 Other benign neoplasm of skin of right lower limb, including hip: Secondary | ICD-10-CM | POA: Diagnosis not present

## 2022-06-02 DIAGNOSIS — M7751 Other enthesopathy of right foot: Secondary | ICD-10-CM

## 2022-06-02 MED ORDER — DEXAMETHASONE SODIUM PHOSPHATE 120 MG/30ML IJ SOLN
2.0000 mg | Freq: Once | INTRAMUSCULAR | Status: AC
  Administered 2022-06-02: 2 mg via INTRA_ARTICULAR

## 2022-06-03 NOTE — Progress Notes (Signed)
Subjective:  Patient ID: Kiara [name], female    DOB: 12/17/1934,  MRN: [id number] HPI Chief Complaint  Patient presents with   Foot Pain    Sub 5th MPJ right - small, callused area x several months, getting more tender to walk, no treatment   New Patient (Initial Visit)    Est pt 2019    88 y.o. female presents with the above complaint.   ROS: Denies fever chills nausea vomit muscle aches pains calf pain back pain chest pain shortness of breath.  Past Medical History:  Diagnosis Date   Arthritis    osteoarthritis -right knee.   C. difficile colitis    past,no recent issues   Colitis    Diverticulitis    Fall    "tripped"-10 days ago-scrapped right knee and lateral right brow area-" healing"   GERD (gastroesophageal reflux disease)    occ.   Seasonal allergies    Past Surgical History:  Procedure Laterality Date   APPENDECTOMY     CATARACT EXTRACTION, BILATERAL     EYE SURGERY     ? right eye retina repair.   HEMORRHOID SURGERY     KNEE ARTHROSCOPY Right 11/26/2014   Procedure: ARTHROSCOPY KNEE, Tear medical horn and anterior horn. Lateral mesicus tear, grade 3 medial;  Surgeon: Dereck Leep, MD;  Location: [facility] ORS;  Service: Orthopedics;  Laterality: Right;   MOLE REMOVAL     benign   TONSILLECTOMY     TOTAL KNEE ARTHROPLASTY Right 02/12/2016   Procedure: RIGHT TOTAL KNEE ARTHROPLASTY;  Surgeon: Gaynelle Arabian, MD;  Location: [facility];  Service: Orthopedics;  Laterality: Right;    Current Outpatient Medications:    donepezil (ARICEPT) 5 MG tablet, Take 5 mg by mouth at bedtime., Disp: , Rfl:    Multiple Vitamins-Minerals (PRESERVISION AREDS PO), Take by mouth., Disp: , Rfl:    Probiotic Product (PROBIOTIC DAILY PO), Take 1 tablet by mouth daily. , Disp: , Rfl:   Allergies  Allergen Reactions   Bacitracin    Ciprofloxacin Other (See Comments)    diarrhea   Review of Systems Objective:  There were no vitals filed for this visit.  General: Well developed,  nourished, in no acute distress, alert and oriented x3   Dermatological: Skin is warm, dry and supple bilateral. Nails x 10 are well maintained; remaining integument appears unremarkable at this time. There are no open sores, no preulcerative lesions, no rash or signs of infection present.  Benign skin lesion such as a porokeratotic lesion beneath the fifth metatarsal head of the right foot.  Also demonstrates a subcutaneous bursitis with fluctuance and tenderness on palpation of the area.  Vascular: Dorsalis Pedis artery and Posterior Tibial artery pedal pulses are 2/4 bilateral with immedate capillary fill time. Pedal hair growth present. No varicosities and no lower extremity edema present bilateral.   Neruologic: Grossly intact via light touch bilateral. Vibratory intact via tuning fork bilateral. Protective threshold with Semmes Wienstein monofilament intact to all pedal sites bilateral. Patellar and Achilles deep tendon reflexes 2+ bilateral. No Babinski or clonus noted bilateral.   Musculoskeletal: No gross boney pedal deformities bilateral. No pain, crepitus, or limitation noted with foot and ankle range of motion bilateral. Muscular strength 5/5 in all groups tested bilateral.  Gait: Unassisted, Nonantalgic.    Radiographs:  None taken  Assessment & Plan:   Assessment: Bursitis with benign skin lesion subfifth metatarsal head right foot  Plan: Injected the area today with dexamethasone and local anesthetic a  total of 2.0 mg of dexamethasone and local anesthetic was injected.  Tolerated procedure well.  Placed padding we will follow-up with her on an as-needed basis.     Dakwon Wenberg T. [place], [place]

## 2022-08-14 ENCOUNTER — Other Ambulatory Visit: Payer: Self-pay | Admitting: Internal Medicine

## 2022-08-14 DIAGNOSIS — Z1231 Encounter for screening mammogram for malignant neoplasm of breast: Secondary | ICD-10-CM

## 2022-09-01 ENCOUNTER — Ambulatory Visit
Admission: RE | Admit: 2022-09-01 | Discharge: 2022-09-01 | Disposition: A | Discharge status: Home / Self Care | Payer: Medicare PPO | Source: Ambulatory Visit | Attending: Internal Medicine | Admitting: Internal Medicine

## 2022-09-01 DIAGNOSIS — Z1231 Encounter for screening mammogram for malignant neoplasm of breast: Secondary | ICD-10-CM

## 2023-01-13 ENCOUNTER — Other Ambulatory Visit: Payer: Self-pay | Admitting: Internal Medicine

## 2023-01-13 DIAGNOSIS — M4807 Spinal stenosis, lumbosacral region: Secondary | ICD-10-CM

## 2023-01-14 ENCOUNTER — Ambulatory Visit
Admission: RE | Admit: 2023-01-14 | Discharge: 2023-01-14 | Disposition: A | Discharge status: Home / Self Care | Payer: Medicare PPO | Source: Ambulatory Visit | Attending: Internal Medicine | Admitting: Internal Medicine

## 2023-01-14 DIAGNOSIS — M4807 Spinal stenosis, lumbosacral region: Secondary | ICD-10-CM | POA: Insufficient documentation

## 2023-07-29 ENCOUNTER — Other Ambulatory Visit: Payer: Self-pay | Admitting: Internal Medicine

## 2023-07-29 DIAGNOSIS — Z1231 Encounter for screening mammogram for malignant neoplasm of breast: Secondary | ICD-10-CM

## 2023-09-06 ENCOUNTER — Ambulatory Visit
Admission: RE | Admit: 2023-09-06 | Discharge: 2023-09-06 | Disposition: A | Discharge status: Home / Self Care | Source: Ambulatory Visit | Attending: Internal Medicine | Admitting: Internal Medicine

## 2023-09-06 DIAGNOSIS — Z1231 Encounter for screening mammogram for malignant neoplasm of breast: Secondary | ICD-10-CM | POA: Insufficient documentation

## 2023-11-15 ENCOUNTER — Emergency Department

## 2023-11-15 ENCOUNTER — Other Ambulatory Visit: Payer: Self-pay

## 2023-11-15 ENCOUNTER — Emergency Department
Admission: EM | Admit: 2023-11-15 | Discharge: 2023-11-15 | Disposition: A | Discharge status: Home / Self Care | Attending: Emergency Medicine | Admitting: Emergency Medicine

## 2023-11-15 DIAGNOSIS — W1839XA Other fall on same level, initial encounter: Secondary | ICD-10-CM | POA: Diagnosis not present

## 2023-11-15 DIAGNOSIS — R0789 Other chest pain: Secondary | ICD-10-CM | POA: Insufficient documentation

## 2023-11-15 DIAGNOSIS — S6992XA Unspecified injury of left wrist, hand and finger(s), initial encounter: Secondary | ICD-10-CM | POA: Diagnosis present

## 2023-11-15 DIAGNOSIS — S61213A Laceration without foreign body of left middle finger without damage to nail, initial encounter: Secondary | ICD-10-CM | POA: Diagnosis not present

## 2023-11-15 DIAGNOSIS — I10 Essential (primary) hypertension: Secondary | ICD-10-CM | POA: Diagnosis not present

## 2023-11-15 DIAGNOSIS — Z23 Encounter for immunization: Secondary | ICD-10-CM | POA: Insufficient documentation

## 2023-11-15 DIAGNOSIS — W19XXXA Unspecified fall, initial encounter: Secondary | ICD-10-CM

## 2023-11-15 MED ORDER — LIDOCAINE HCL (PF) 1 % IJ SOLN
5.0000 mL | Freq: Once | INTRAMUSCULAR | Status: AC
  Administered 2023-11-15: 5 mL
  Filled 2023-11-15: qty 5

## 2023-11-15 MED ORDER — TETANUS-DIPHTH-ACELL PERTUSSIS 5-2.5-18.5 LF-MCG/0.5 IM SUSY
0.5000 mL | PREFILLED_SYRINGE | Freq: Once | INTRAMUSCULAR | Status: AC
  Administered 2023-11-15: 0.5 mL via INTRAMUSCULAR
  Filled 2023-11-15: qty 0.5

## 2023-11-15 MED ORDER — ACETAMINOPHEN 325 MG PO TABS
650.0000 mg | ORAL_TABLET | Freq: Once | ORAL | Status: AC
  Administered 2023-11-15: 650 mg via ORAL
  Filled 2023-11-15: qty 2

## 2023-11-15 NOTE — ED Provider Notes (Signed)
 [facility] Emergency Department Provider Note     Event Date/Time   First MD Initiated Contact with Patient 11/15/23 1800     (approximate)   History   Fall   HPI  Kiara [name] is a 89 y.o. right-handed female with a history of OA, HTN, HLD, and GERD, presents to the ED for evaluation of a mechanical fall.  Patient presents primarily complaining of a laceration to her left middle finger.  She notes normal composite fist.  She is unclear of her tetanus status.  She reports some mild chest wall discomfort, but denies any significant chest pain or shortness of breath.  No other injury reported at this time but denies any head injury, LOC, or blood thinner use.  Physical Exam   Triage Vital Signs: ED Triage Vitals [11/15/23 1658]  Encounter Vitals Group     BP (!) 166/93     Girls Systolic BP Percentile      Girls Diastolic BP Percentile      Boys Systolic BP Percentile      Boys Diastolic BP Percentile      Pulse Rate 72     Resp 17     Temp 98.1 F (36.7 C)     Temp Source Oral     SpO2 98 %     Weight 133 lb (60.3 kg)     Height 5' 1 (1.549 m)     Head Circumference      Peak Flow      Pain Score 1     Pain Loc      Pain Education      Exclude from Growth Chart     Most recent vital signs: Vitals:   11/15/23 1658  BP: (!) 166/93  Pulse: 72  Resp: 17  Temp: 98.1 F (36.7 C)  SpO2: 98%    General Awake, no distress. NAD HEENT NCAT. PERRL. EOMI. No rhinorrhea. Mucous membranes are moist.  CV:  Good peripheral perfusion. RRR RESP:  Normal effort. CTA.  No chest wall deformity or dyskinetic movement noted.  Mildly tender to palpation to the left sternal border. ABD:  No distention.  MSK:  AROM of all extremities.  Left hand with a linear laceration across the left middle finger at the PIP.  Normal composite fist on the left.  Exploration of the wound, shows exposed extensor tendon without evidence of rupture.   ED Results /  Procedures / Treatments   Labs (all labs ordered are listed, but only abnormal results are displayed) Labs Reviewed - No data to display   EKG    RADIOLOGY  I personally viewed and evaluated these images as part of my medical decision making, as well as reviewing the written report by the radiologist.  ED Provider Interpretation: No acute findings  DG Chest 2 View Result Date: 11/15/2023 CLINICAL DATA:  Sternal chest wall tenderness status post fall EXAM: CHEST - 2 VIEW COMPARISON:  CT chest 09/13/2020 FINDINGS: Normal cardiomediastinal silhouette. Aortic atherosclerotic calcification. No focal consolidation, pleural effusion, or pneumothorax. No displaced rib fractures. No evidence of displaced sternal fracture on lateral view. IMPRESSION: No acute cardiopulmonary process or displaced fracture. Electronically Signed   By: Norman Gatlin M.D.   On: 11/15/2023 20:00   DG Hand Complete Left Result Date: 11/15/2023 CLINICAL DATA:  Laceration third digit, fell EXAM: LEFT HAND - COMPLETE 3+ VIEW COMPARISON:  None Available. FINDINGS: Frontal, oblique, and lateral views of the left hand are  obtained. Jewelry obscures portions of the fourth proximal phalanx. No acute fracture, subluxation, or dislocation. There is multifocal osteoarthritis, with significant joint space narrowing and osteophyte formation at the first carpometacarpal joint, and mild diffuse interphalangeal joint space narrowing. Soft tissue swelling of the third digit consistent with history of laceration. There is no fracture or radiopaque foreign body. IMPRESSION: 1. Soft tissue swelling third digit. No fracture or radiopaque foreign body. 2. Multifocal osteoarthritis. Electronically Signed   By: Ozell Daring M.D.   On: 11/15/2023 18:53     PROCEDURES:  Critical Care performed: No  .Laceration Repair  Date/Time: 11/15/2023 7:11 PM  Performed by: Loyd Candida LULLA Aldona, PA-C Authorized by: Loyd Candida LULLA Aldona, PA-C    Consent:    Consent obtained:  Verbal   Consent given by:  Patient   Risks, benefits, and alternatives were discussed: yes     Risks discussed:  Pain, poor wound healing and tendon damage Universal protocol:    Test results available: yes     Imaging studies available: yes     Site/side marked: yes     Patient identity confirmed:  Verbally with patient Anesthesia:    Anesthesia method:  Local infiltration   Local anesthetic:  Lidocaine  1% w/o epi Laceration details:    Location:  Finger   Finger location:  L long finger   Length (cm):  1   Depth (mm):  5 Pre-procedure details:    Preparation:  Patient was prepped and draped in usual sterile fashion Exploration:    Limited defect created (wound extended): no     Hemostasis achieved with:  Direct pressure   Contaminated: no   Treatment:    Area cleansed with:  Povidone-iodine and saline   Amount of cleaning:  Standard   Irrigation solution:  Sterile saline   Irrigation volume:  20   Irrigation method:  Syringe   Visualized foreign bodies/material removed: yes   Skin repair:    Repair method:  Sutures   Suture size:  5-0   Suture material:  Nylon   Suture technique:  Simple interrupted   Number of sutures:  3 Approximation:    Approximation:  Close Repair type:    Repair type:  Simple Post-procedure details:    Dressing:  Non-adherent dressing   Procedure completion:  Tolerated well, no immediate complications Comments:     RUE Skin tear repaired with Dermabond    MEDICATIONS ORDERED IN ED: Medications  acetaminophen  (TYLENOL ) tablet 650 mg (650 mg Oral Given 11/15/23 1843)  Tdap (BOOSTRIX) injection 0.5 mL (0.5 mLs Intramuscular Given 11/15/23 1844)  lidocaine  (PF) (XYLOCAINE ) 1 % injection 5 mL (5 mLs Infiltration Given by Other 11/15/23 1909)     IMPRESSION / MDM / ASSESSMENT AND PLAN / ED COURSE  I reviewed the triage vital signs and the nursing notes.                              Differential diagnosis  includes, but is not limited to, abrasion, laceration, contusion, fracture, dislocation, tendon rupture, myalgias  Patient's presentation is most consistent with acute complicated illness / injury requiring diagnostic workup.  Patient's diagnosis is consistent with mechanical fall resulting in a left middle finger laceration and chest wall discomfort.  Wound repair was performed to the left middle finger with underlying visualization of the extensor tendon without evidence of rupture or derangement.  Remaining labs reassuring.  Patient with a skin tear  to the RUE that was cleansed and repaired using Dermabond.  Chest x-ray interpreted by me, shows no acute intrathoracic process no evidence of any acute displaced rib or sternal fracture.  Patient will be discharged home with instructions to take OTC Tylenol  as needed. Patient is to follow up with her PCP in 7 to 10 days for suture removal as discussed, as needed or otherwise directed. Patient is given ED precautions to return to the ED for any worsening or new symptoms.   FINAL CLINICAL IMPRESSION(S) / ED DIAGNOSES   Final diagnoses:  Fall, initial encounter  Laceration of left middle finger without foreign body without damage to nail, initial encounter  Anterior chest wall pain     Rx / DC Orders   ED Discharge Orders     None        Note:  This document was prepared using Dragon voice recognition software and may include unintentional dictation errors.    Loyd Candida LULLA Aldona, PA-C 11/15/23 2346    Dorothyann Drivers, MD 11/16/23 2200

## 2023-11-15 NOTE — Discharge Instructions (Signed)
 Keep the wound clean, dry, and covered.  Your x-rays are negative for any acute fracture or dislocation.  See your primary provider for suture removal in 7 to 10 days.  Apply ice and/or moist heat any sore muscles.  Take OTC Tylenol  Motrin as needed for pain.

## 2023-11-15 NOTE — ED Triage Notes (Signed)
 Pt sts that she fell while getting her mail. Pt sts that she is having pain on her left third finger. Bleeding controlled at this time.

## 2024-01-18 ENCOUNTER — Encounter: Payer: Self-pay | Admitting: Radiation Oncology

## 2024-01-18 ENCOUNTER — Ambulatory Visit
Admission: RE | Admit: 2024-01-18 | Discharge: 2024-01-18 | Disposition: A | Discharge status: Home / Self Care | Source: Ambulatory Visit | Attending: Radiation Oncology | Admitting: Radiation Oncology

## 2024-01-18 VITALS — BP 160/70 | HR 77 | Resp 16 | Ht 64.0 in | Wt 129.3 lb

## 2024-01-18 DIAGNOSIS — M1712 Unilateral primary osteoarthritis, left knee: Secondary | ICD-10-CM | POA: Insufficient documentation

## 2024-01-18 DIAGNOSIS — Z803 Family history of malignant neoplasm of breast: Secondary | ICD-10-CM | POA: Diagnosis not present

## 2024-01-18 DIAGNOSIS — Z79899 Other long term (current) drug therapy: Secondary | ICD-10-CM | POA: Insufficient documentation

## 2024-01-18 DIAGNOSIS — Z87891 Personal history of nicotine dependence: Secondary | ICD-10-CM | POA: Diagnosis not present

## 2024-01-18 DIAGNOSIS — K219 Gastro-esophageal reflux disease without esophagitis: Secondary | ICD-10-CM | POA: Diagnosis not present

## 2024-01-18 DIAGNOSIS — Z8619 Personal history of other infectious and parasitic diseases: Secondary | ICD-10-CM | POA: Insufficient documentation

## 2024-01-18 DIAGNOSIS — Z791 Long term (current) use of non-steroidal anti-inflammatories (NSAID): Secondary | ICD-10-CM | POA: Diagnosis not present

## 2024-01-18 NOTE — Consult Note (Signed)
 NEW PATIENT EVALUATION  Name: Kiara [name]  MRN: [id number]  Date:   01/18/2024     DOB: 12-26-1934   This 89 y.o. female patient presents to the clinic for initial evaluation of osteoarthritis of her left knee.  REFERRING PHYSICIAN: Cleotilde Oneil FALCON, MD  CHIEF COMPLAINT:  Chief Complaint  Patient presents with   Osteoarthritis    Consult    DIAGNOSIS: The encounter diagnosis was Unilateral primary osteoarthritis, left knee.   PREVIOUS INVESTIGATIONS:  Clinical notes reviewed  HPI: Patient is a 89 year old female with history of total right knee joint replacement back in 2017.  She has had problems with back pain and left knee pain on and off for several years.  She describes her pain now on ambulation and sometimes up to a 10.  She does take Celebrex for her pain.  She ambulates with the assistance of a cane although she is not using her cane today.  I been asked to evaluate her for possible low-dose radiation therapy for left knee osteoarthritis.  PLANNED TREATMENT REGIMEN: Low-dose XRT to left knee  PAST MEDICAL HISTORY:  has a past medical history of Arthritis, C. difficile colitis, Colitis, Diverticulitis, Fall, GERD (gastroesophageal reflux disease), and Seasonal allergies.    PAST SURGICAL HISTORY:  Past Surgical History:  Procedure Laterality Date   APPENDECTOMY     CATARACT EXTRACTION, BILATERAL     EYE SURGERY     ? right eye retina repair.   HEMORRHOID SURGERY     KNEE ARTHROSCOPY Right 11/26/2014   Procedure: ARTHROSCOPY KNEE, Tear medical horn and anterior horn. Lateral mesicus tear, grade 3 medial;  Surgeon: Lynwood SHAUNNA Hue, MD;  Location: [facility];  Service: Orthopedics;  Laterality: Right;   MOLE REMOVAL     benign   TONSILLECTOMY     TOTAL KNEE ARTHROPLASTY Right 02/12/2016   Procedure: RIGHT TOTAL KNEE ARTHROPLASTY;  Surgeon: Dempsey Moan, MD;  Location: [facility];  Service: Orthopedics;  Laterality: Right;    FAMILY HISTORY: family history includes Bone  cancer in her paternal uncle; Breast cancer in her paternal aunt.  SOCIAL HISTORY:  reports that she quit smoking about 25 years ago. Her smoking use included cigarettes. She has never used smokeless tobacco. She reports current alcohol use of about 2.0 standard drinks of alcohol per week. She reports that she does not use drugs.  ALLERGIES: Bacitracin and Ciprofloxacin  MEDICATIONS:  Current Outpatient Medications  Medication Sig Dispense Refill   celecoxib (CELEBREX) 100 MG capsule Take 100 mg by mouth 2 (two) times daily.     pantoprazole (PROTONIX) 40 MG tablet Take 40 mg by mouth daily.     donepezil (ARICEPT) 5 MG tablet Take 5 mg by mouth at bedtime.     Multiple Vitamins-Minerals (PRESERVISION AREDS PO) Take by mouth.     Probiotic Product (PROBIOTIC DAILY PO) Take 1 tablet by mouth daily.      No current facility-administered medications for this encounter.    ECOG PERFORMANCE STATUS:  1 - Symptomatic but completely ambulatory  REVIEW OF SYSTEMS: Patient denies any weight loss, fatigue, weakness, fever, chills or night sweats. Patient denies any loss of vision, blurred vision. Patient denies any ringing  of the ears or hearing loss. No irregular heartbeat. Patient denies heart murmur or history of fainting. Patient denies any chest pain or pain radiating to her upper extremities. Patient denies any shortness of breath, difficulty breathing at night, cough or hemoptysis. Patient denies any swelling in the lower legs.  Patient denies any nausea vomiting, vomiting of blood, or coffee ground material in the vomitus. Patient denies any stomach pain. Patient states has had normal bowel movements no significant constipation or diarrhea. Patient denies any dysuria, hematuria or significant nocturia. Patient denies any problems walking, swelling in the joints or loss of balance. Patient denies any skin changes, loss of hair or loss of weight. Patient denies any excessive worrying or anxiety or  significant depression. Patient denies any problems with insomnia. Patient denies excessive thirst, polyuria, polydipsia. Patient denies any swollen glands, patient denies easy bruising or easy bleeding. Patient denies any recent infections, allergies or URI. Patient s visual fields have not changed significantly in recent time.   PHYSICAL EXAM: BP (!) 160/70   Pulse 77   Resp 16   Ht 5' 4 (1.626 m)   Wt 129 lb 4.8 oz (58.7 kg)   BMI 22.19 kg/m  Range of motion of both knees does not cause significant pain.  Range of motion of her hips does not cause significant pain motor and sensory levels are equal and symmetric in the lower extremities.  Well-developed well-nourished patient in NAD. HEENT reveals PERLA, EOMI, discs not visualized.  Oral cavity is clear. No oral mucosal lesions are identified. Neck is clear without evidence of cervical or supraclavicular adenopathy. Lungs are clear to A&P. Cardiac examination is essentially unremarkable with regular rate and rhythm without murmur rub or thrill. Abdomen is benign with no organomegaly or masses noted. Motor sensory and DTR levels are equal and symmetric in the upper and lower extremities. Cranial nerves II through XII are grossly intact. Proprioception is intact. No peripheral adenopathy or edema is identified. No motor or sensory levels are noted. Crude visual fields are within normal range.  LABORATORY DATA: No current labs for review    RADIOLOGY RESULTS: No current films for review   IMPRESSION: Osteoarthritis of the left knee and 89 year old female for low-dose XRT  PLAN: At this time I have recommended low-dose radiation therapy to her left knee.  Would plan on delivering 3 Gray in 6 fractions over 3 weeks.  Risks and benefits of treatment including extremely low side effect profile with case reports of absolutely no side effects or incidence of secondary malignancies secondary to low-dose radiation for osteoarthritis.  I have set her up  for simulation for next week.  Patient and daughter both comprehend my recommendations well.  I would like to take this opportunity to thank you for allowing me to participate in the care of your patient.SABRA Marcey Penton, MD

## 2024-01-23 NOTE — Therapy (Signed)
 OUTPATIENT PHYSICAL THERAPY KNEE/BALANCE EVALUATION  Patient Name: Kiara [name] MRN: [id number] DOB:1934/07/19, 89 y.o., female Today's Date: 01/24/2024  END OF SESSION:  PT End of Session - 01/24/24 0824     Visit Number 1    Number of Visits 17    Date for Recertification  03/20/24    PT Start Time 0825    PT Stop Time 0906    PT Time Calculation (min) 41 min    Activity Tolerance Patient tolerated treatment well    Behavior During Therapy [facility] for tasks assessed/performed          Past Medical History:  Diagnosis Date   Arthritis    osteoarthritis -right knee.   C. difficile colitis    past,no recent issues   Colitis    Diverticulitis    Fall    tripped-10 days ago-scrapped right knee and lateral right brow area- healing   GERD (gastroesophageal reflux disease)    occ.   Seasonal allergies    Past Surgical History:  Procedure Laterality Date   APPENDECTOMY     CATARACT EXTRACTION, BILATERAL     EYE SURGERY     ? right eye retina repair.   HEMORRHOID SURGERY     KNEE ARTHROSCOPY Right 11/26/2014   Procedure: ARTHROSCOPY KNEE, Tear medical horn and anterior horn. Lateral mesicus tear, grade 3 medial;  Surgeon: Lynwood SHAUNNA Hue, MD;  Location: [facility];  Service: Orthopedics;  Laterality: Right;   MOLE REMOVAL     benign   TONSILLECTOMY     TOTAL KNEE ARTHROPLASTY Right 02/12/2016   Procedure: RIGHT TOTAL KNEE ARTHROPLASTY;  Surgeon: Dempsey Moan, MD;  Location: [facility];  Service: Orthopedics;  Laterality: Right;   Patient Active Problem List   Diagnosis Date Noted   Mild cognitive impairment 02/02/2022   TIA (transient ischemic attack) 08/08/2020   Degenerative lumbar spinal stenosis 06/29/2019   Pulmonary nodules/lesions, multiple 02/08/2019   Exudative age-related macular degeneration of left eye with active choroidal neovascularization (HCC) 01/20/2019   Idiopathic peripheral neuropathy 01/20/2019   Radial styloid tenosynovitis 03/28/2018   Pain in  right knee 02/23/2018   History of Clostridium difficile colitis 12/16/2017   Irritable bowel syndrome without diarrhea 12/16/2017   Cataract cortical, senile 08/03/2017   Chronic colitis 08/03/2017   Medicare annual wellness visit, initial 12/15/2016   Benign essential hypertension 11/04/2016   OA (osteoarthritis) of knee 02/12/2016   Hyperlipidemia, mixed 10/24/2015   Clostridium difficile colitis 01/02/2014   Right bundle branch block 10/18/2013    PCP: Cleotilde Oneil FALCON, MD  REFERRING PROVIDER: Marchia Drivers, MD  REFERRING DIAG: M17.0 (ICD-10-CM) - Primary osteoarthritis of both knees   RATIONALE FOR EVALUATION AND TREATMENT: Rehabilitation  THERAPY DIAG: Unsteadiness on feet  Muscle weakness (generalized)  Chronic pain of both knees  ONSET DATE: Chronic     FOLLOW-UP APPT SCHEDULED WITH REFERRING PROVIDER: No    SUBJECTIVE:  SUBJECTIVE STATEMENT:    Patient is an 89 year old female presenting with unsteadiness and R/L knee pain.   PERTINENT HISTORY:   Kiara [name] is an 89 year old female presenting to OPPT with a chief concern of balance deficits and bilateral knee pain. She has a history of TIA, falls and lumbar spinal stenosis and R TKA. Patient received corticosteroid injection the L knee. History of balance deficits primarily due to baseline peripheral neuropathy in bilateral feet contributing to difficulty walking. She reports that her balance has worsened within the last month. Patient currently walking with hurry cane in the community. She reports that she drifts to one side (L > R) while walking. Patient reports that she will have directed radiation towards the L knee performed by her oncologist.   R Hand Dominant.   Imaging: No   PAIN:   Pain Intensity: Present: 0/10,  Best: 0/10, Worst: 9/10 Pain location: bilateral knees  Pain quality: intermittent and sharp  Radiating pain: No  Swelling: No  Numbness/Tingling: Yes Focal weakness or buckling: No Aggravating factors: Walking Relieving factors: Sitting   PRECAUTIONS: Fall  WEIGHT BEARING RESTRICTIONS: No  FALLS: Has patient fallen in last 6 months? Yes. Number of falls 1  Living Environment Lives with: lives alone Lives in: House/apartment Stairs: No Has following equipment at home: Vannie - 4 wheeled and hurry cane  Prior level of function: Independent  Occupational demands: Retired  Presenter, Broadcasting: Taking care of her dog, walking   Patient Goals: I would like to be able to walk without assistance    OBJECTIVE:   PATIENT SURVEYS:  LEFS: 48/80 (80/80 = no disability) ABC: 74/100% - Moderate confidence    Cognition Patient is oriented to person, place, and time.  Recent memory is intact.  Accompanied by daughter: Kiara [name]   Gross Musculoskeletal Assessment Tremor: None Bulk: Normal Tone: Normal  GAIT: Distance walked: 42m  Assistive device utilized: hurry cane Level of assistance: Modified independence Comments: R hand hurry cane, reciprocal gait   Posture:  AROM AROM (Normal range in degrees) AROM  Hip Right Left  Flexion (125)    Extension (15)    Abduction (40)    Adduction     Internal Rotation (45)    External Rotation (45)        Knee    Flexion (135)    Extension (0)        Ankle    Dorsiflexion (20)    Plantarflexion (50)    Inversion (35)    Eversion (15    (* = pain; Blank rows = not tested)  LE MMT: MMT (out of 5) Right Left  Hip flexion 4- 4-  Hip extension    Hip abduction    Hip adduction    Hip internal rotation    Hip external rotation    Knee flexion 4- 4-  Knee extension 4 4  Ankle dorsiflexion 5 5  Ankle plantarflexion 5 5  Ankle inversion    Ankle eversion    (* = pain; Blank rows = not tested)  Sensation Grossly intact to  light touch bilateral LEs as determined by testing dermatomes L2-S2. Proprioception, and hot/cold testing deferred on this date.  Reflexes R/L Knee Jerk (L3/4): 2+/1+  Ankle Jerk (S1/2): 2+/1+  Muscle Length Not tested  Palpation Location LEFT  RIGHT           Quadriceps 0 0  Medial Hamstrings 0 0  Lateral Hamstrings 0 0  Lateral Hamstring tendon  Medial Hamstring tendon    Quadriceps tendon 0 0  Patella    Patellar Tendon    Tibial Tuberosity    Medial joint line    Lateral joint line    MCL    LCL    Adductor Tubercle    Pes Anserine tendon    Infrapatellar fat pad    Fibular head    Popliteal fossa    (Blank rows = not tested) Graded on 0-4 scale (0 = no pain, 1 = pain, 2 = pain with wincing/grimacing/flinching, 3 = pain with withdrawal, 4 = unwilling to allow palpation), (Blank rows = not tested)  Passive Accessory Motion Deferred  Functional Test:  : 19.77 5TSTS: 15.88s  TODAY'S TREATMENT: DATE: 01/24/24  Gait Training (8 min billed):  91m in the gym    - PT educated patient on proper use of SPC and stepping strategy in order to optimize safety in household. Good return demonstration with increased repetitions. Verbal cues for LLE to progress in tandem with R SPC.   PATIENT EDUCATION:  Education details: HEP, Prognosis, POC  Person educated: Patient and Child(ren) Education method: Explanation, Demonstration, and Handouts Education comprehension: verbalized understanding and returned demonstration   HOME EXERCISE PROGRAM:  Access Code: [email or web address] URL: [email or web address] Date: 01/24/2024 Prepared by: Lonni Pall  Exercises - Standing March with Counter Support  - 1 x daily - 3-4 x weekly - 2-3 sets - 10-12 reps - Feet Together Balance at [employer] Eyes Closed  - 1 x daily - 7 x weekly - 3 sets - 30 hold - Sit to Stand with Armchair  - 1 x daily - 3-4 x weekly - 2-3 sets - 8-10 reps  ASSESSMENT:  CLINICAL  IMPRESSION: Patient is a 89 y.o. female who was seen today for physical therapy evaluation and treatment for balance deficits and knee pain. Sensation in LE intact except for sole of the feet. Patient's knee pain only exacerbated with prolonged standing and walking throughout the community. PT education provided on proper gait training with SPC using reciprocal step through pattern in order to optimize safety. She self reported a 74%/100% on the ABC indicated moderate balance confidence with functional activities. Additionally she self reported 48/80 on LEFS indicated moderate to significant functional disability due to LE pain. Patient presents with deficits in LE strength and balance affecting full participation with prolonged walking, recreational activities at the beach and family. Patient will benefit from skilled PT in order to address current deficits and maximize return to PLOF.   OBJECTIVE IMPAIRMENTS: Abnormal gait, decreased activity tolerance, decreased balance, decreased endurance, difficulty walking, decreased ROM, decreased strength, impaired sensation, and pain.   ACTIVITY LIMITATIONS: carrying, lifting, standing, squatting, and stairs  PARTICIPATION LIMITATIONS: shopping, community activity, and yard work  PERSONAL FACTORS: Age, Past/current experiences, Profession, Sex, Time since onset of injury/illness/exacerbation, and 1-2 comorbidities: HTN, Hx of TIA are also affecting patient's functional outcome.   REHAB POTENTIAL: Fair    CLINICAL DECISION MAKING: Evolving/moderate complexity  EVALUATION COMPLEXITY: Moderate   GOALS: Goals reviewed with patient? Yes  SHORT TERM GOALS: Target date: 03/06/2024  Pt will be independent with HEP to improve strength and decrease knee pain to improve pain-free function at home and work. Baseline: 01/24/2024: Initial HEP provided  Goal status: INITIAL   LONG TERM GOALS: Target date: 04/18/2023  Pt will improve ABC by at least 13% in  order to demonstrate clinically significant improvement in balance confidence. Baseline: 01/24/2024: ABC score: 1190 / 1600 = 74.4 %  Goal status: INITIAL  2.  Pt will decrease worst knee pain by at least 3 points on the NPRS in order to demonstrate clinically significant reduction in knee pain. Baseline: 01/24/2024: Goal status: INITIAL  3.  Pt will increase LEFS score by at least 9 points in order demonstrate clinically significant reduction in knee pain/disability.       Baseline: 01/24/2024: 48/80 (80/80 = no disability) Goal status: INITIAL  4.  Pt will increase strength of knee flexion/extension by at least 1/2 MMT grade in order to demonstrate improvement in strength and function  Baseline: 01/24/2024: R/L: 4- knee Flexion, R/L 4/5 Knee Extension Goal status: INITIAL  5.  Pt will increase by at least 0.13 m/s in order to demonstrate clinically significant improvement in community ambulation.  Baseline: 01/24/2024:19.77s Goal status: INITIAL  6.  Pt will decrease 5TSTS by at least 3 seconds in order to demonstrate clinically significant improvement in LE strength and functional ability.  Baseline: 01/24/2024: 15.88s Goal status: INITIAL  PLAN:  PT FREQUENCY: 1-2x/week  PT DURATION: 12 weeks  PLANNED INTERVENTIONS: 97110-Therapeutic exercises, 97530- Therapeutic activity, 97112- Neuromuscular re-education, 97535- Self Care, [id number]- Manual therapy, [phone]- Gait training, Patient/Family education, Balance training, Stair training, Cryotherapy, and Moist heat  PLAN FOR NEXT SESSION: Investment Banker, Operational, Location Manager, LE strengthening  Lonni Pall PT, DPT Physical Therapist- [place]  01/24/2024, 3:32 PM

## 2024-01-24 ENCOUNTER — Ambulatory Visit
Admission: RE | Admit: 2024-01-24 | Discharge: 2024-01-24 | Disposition: A | Discharge status: Home / Self Care | Source: Ambulatory Visit | Attending: Radiation Oncology | Admitting: Radiation Oncology

## 2024-01-24 ENCOUNTER — Ambulatory Visit: Attending: Orthopedic Surgery

## 2024-01-24 ENCOUNTER — Other Ambulatory Visit: Payer: Self-pay | Admitting: Family Medicine

## 2024-01-24 DIAGNOSIS — R2681 Unsteadiness on feet: Secondary | ICD-10-CM | POA: Insufficient documentation

## 2024-01-24 DIAGNOSIS — M6281 Muscle weakness (generalized): Secondary | ICD-10-CM | POA: Insufficient documentation

## 2024-01-24 DIAGNOSIS — M25562 Pain in left knee: Secondary | ICD-10-CM | POA: Insufficient documentation

## 2024-01-24 DIAGNOSIS — M1712 Unilateral primary osteoarthritis, left knee: Secondary | ICD-10-CM | POA: Diagnosis present

## 2024-01-24 DIAGNOSIS — M25561 Pain in right knee: Secondary | ICD-10-CM | POA: Insufficient documentation

## 2024-01-24 DIAGNOSIS — G8929 Other chronic pain: Secondary | ICD-10-CM | POA: Insufficient documentation

## 2024-01-24 DIAGNOSIS — G459 Transient cerebral ischemic attack, unspecified: Secondary | ICD-10-CM

## 2024-01-25 ENCOUNTER — Ambulatory Visit: Admission: RE | Admit: 2024-01-25 | Source: Ambulatory Visit

## 2024-01-25 ENCOUNTER — Ambulatory Visit

## 2024-01-25 ENCOUNTER — Ambulatory Visit
Admission: RE | Admit: 2024-01-25 | Discharge: 2024-01-25 | Disposition: A | Discharge status: Home / Self Care | Source: Ambulatory Visit | Attending: Family Medicine | Admitting: Family Medicine

## 2024-01-25 DIAGNOSIS — G459 Transient cerebral ischemic attack, unspecified: Secondary | ICD-10-CM | POA: Diagnosis present

## 2024-01-25 DIAGNOSIS — M1712 Unilateral primary osteoarthritis, left knee: Secondary | ICD-10-CM | POA: Diagnosis not present

## 2024-01-26 ENCOUNTER — Ambulatory Visit

## 2024-01-31 ENCOUNTER — Ambulatory Visit: Attending: Orthopedic Surgery

## 2024-01-31 DIAGNOSIS — G8929 Other chronic pain: Secondary | ICD-10-CM | POA: Diagnosis present

## 2024-01-31 DIAGNOSIS — M25561 Pain in right knee: Secondary | ICD-10-CM | POA: Diagnosis present

## 2024-01-31 DIAGNOSIS — M6281 Muscle weakness (generalized): Secondary | ICD-10-CM | POA: Insufficient documentation

## 2024-01-31 DIAGNOSIS — M25562 Pain in left knee: Secondary | ICD-10-CM | POA: Diagnosis present

## 2024-01-31 DIAGNOSIS — R2681 Unsteadiness on feet: Secondary | ICD-10-CM | POA: Insufficient documentation

## 2024-01-31 NOTE — Therapy (Signed)
 OUTPATIENT PHYSICAL THERAPY KNEE/BALANCE TREATMENT  Patient Name: Kiara [name] MRN: [id number] DOB:12/12/1934, 89 y.o., female Today's Date: 01/31/2024  END OF SESSION:  PT End of Session - 01/31/24 1338     Visit Number 2    Number of Visits 17    Date for Recertification  03/20/24    PT Start Time 1300    PT Stop Time 1340    PT Time Calculation (min) 40 min    Activity Tolerance Patient tolerated treatment well    Behavior During Therapy [facility] for tasks assessed/performed           Past Medical History:  Diagnosis Date   Arthritis    osteoarthritis -right knee.   C. difficile colitis    past,no recent issues   Colitis    Diverticulitis    Fall    tripped-10 days ago-scrapped right knee and lateral right brow area- healing   GERD (gastroesophageal reflux disease)    occ.   Seasonal allergies    Past Surgical History:  Procedure Laterality Date   APPENDECTOMY     CATARACT EXTRACTION, BILATERAL     EYE SURGERY     ? right eye retina repair.   HEMORRHOID SURGERY     KNEE ARTHROSCOPY Right 11/26/2014   Procedure: ARTHROSCOPY KNEE, Tear medical horn and anterior horn. Lateral mesicus tear, grade 3 medial;  Surgeon: Lynwood SHAUNNA Hue, MD;  Location: [facility] ORS;  Service: Orthopedics;  Laterality: Right;   MOLE REMOVAL     benign   TONSILLECTOMY     TOTAL KNEE ARTHROPLASTY Right 02/12/2016   Procedure: RIGHT TOTAL KNEE ARTHROPLASTY;  Surgeon: Dempsey Moan, MD;  Location: [facility];  Service: Orthopedics;  Laterality: Right;   Patient Active Problem List   Diagnosis Date Noted   Mild cognitive impairment 02/02/2022   TIA (transient ischemic attack) 08/08/2020   Degenerative lumbar spinal stenosis 06/29/2019   Pulmonary nodules/lesions, multiple 02/08/2019   Exudative age-related macular degeneration of left eye with active choroidal neovascularization (HCC) 01/20/2019   Idiopathic peripheral neuropathy 01/20/2019   Radial styloid tenosynovitis 03/28/2018   Pain in  right knee 02/23/2018   History of Clostridium difficile colitis 12/16/2017   Irritable bowel syndrome without diarrhea 12/16/2017   Cataract cortical, senile 08/03/2017   Chronic colitis 08/03/2017   Medicare annual wellness visit, initial 12/15/2016   Benign essential hypertension 11/04/2016   OA (osteoarthritis) of knee 02/12/2016   Hyperlipidemia, mixed 10/24/2015   Clostridium difficile colitis 01/02/2014   Right bundle branch block 10/18/2013    PCP: Cleotilde Oneil FALCON, MD  REFERRING PROVIDER: Marchia Drivers, MD  REFERRING DIAG: M17.0 (ICD-10-CM) - Primary osteoarthritis of both knees   RATIONALE FOR EVALUATION AND TREATMENT: Rehabilitation  THERAPY DIAG: Unsteadiness on feet  Muscle weakness (generalized)  Chronic pain of both knees  ONSET DATE: Chronic     FOLLOW-UP APPT SCHEDULED WITH REFERRING PROVIDER: No    SUBJECTIVE:  SUBJECTIVE STATEMENT:    Patient is an 89 year old female presenting with unsteadiness and R/L knee pain.   PERTINENT HISTORY:   DEZAREE TRACEY is an 89 year old female presenting to OPPT with a chief concern of balance deficits and bilateral knee pain. She has a history of TIA, falls and lumbar spinal stenosis and R TKA. Patient received corticosteroid injection the L knee. History of balance deficits primarily due to baseline peripheral neuropathy in bilateral feet contributing to difficulty walking. She reports that her balance has worsened within the last month. Patient currently walking with hurry cane in the community. She reports that she drifts to one side (L > R) while walking. Patient reports that she will have directed radiation towards the L knee performed by her oncologist.   R Hand Dominant.   Imaging: No   PAIN:   Pain Intensity: Present: 0/10,  Best: 0/10, Worst: 9/10 Pain location: bilateral knees  Pain quality: intermittent and sharp  Radiating pain: No  Swelling: No  Numbness/Tingling: Yes Focal weakness or buckling: No Aggravating factors: Walking Relieving factors: Sitting   PRECAUTIONS: Fall  WEIGHT BEARING RESTRICTIONS: No  FALLS: Has patient fallen in last 6 months? Yes. Number of falls 1  Living Environment Lives with: lives alone Lives in: House/apartment Stairs: No Has following equipment at home: Vannie - 4 wheeled and hurry cane  Prior level of function: Independent  Occupational demands: Retired  Presenter, Broadcasting: Taking care of her dog, walking   Patient Goals: I would like to be able to walk without assistance    OBJECTIVE:   PATIENT SURVEYS:  LEFS: 48/80 (80/80 = no disability) ABC: 74/100% - Moderate confidence    Cognition Patient is oriented to person, place, and time.  Recent memory is intact.  Accompanied by daughter: Kiara [name]   Gross Musculoskeletal Assessment Tremor: None Bulk: Normal Tone: Normal  GAIT: Distance walked: 45m  Assistive device utilized: hurry cane Level of assistance: Modified independence Comments: R hand hurry cane, reciprocal gait   Posture:  AROM AROM (Normal range in degrees) AROM  Hip Right Left  Flexion (125)    Extension (15)    Abduction (40)    Adduction     Internal Rotation (45)    External Rotation (45)        Knee    Flexion (135)    Extension (0)        Ankle    Dorsiflexion (20)    Plantarflexion (50)    Inversion (35)    Eversion (15    (* = pain; Blank rows = not tested)  LE MMT: MMT (out of 5) Right Left  Hip flexion 4- 4-  Hip extension    Hip abduction    Hip adduction    Hip internal rotation    Hip external rotation    Knee flexion 4- 4-  Knee extension 4 4  Ankle dorsiflexion 5 5  Ankle plantarflexion 5 5  Ankle inversion    Ankle eversion    (* = pain; Blank rows = not tested)  Sensation Grossly intact to  light touch bilateral LEs as determined by testing dermatomes L2-S2. Proprioception, and hot/cold testing deferred on this date.  Reflexes R/L Knee Jerk (L3/4): 2+/1+  Ankle Jerk (S1/2): 2+/1+  Muscle Length Not tested  Palpation Location LEFT  RIGHT           Quadriceps 0 0  Medial Hamstrings 0 0  Lateral Hamstrings 0 0  Lateral Hamstring tendon  Medial Hamstring tendon    Quadriceps tendon 0 0  Patella    Patellar Tendon    Tibial Tuberosity    Medial joint line    Lateral joint line    MCL    LCL    Adductor Tubercle    Pes Anserine tendon    Infrapatellar fat pad    Fibular head    Popliteal fossa    (Blank rows = not tested) Graded on 0-4 scale (0 = no pain, 1 = pain, 2 = pain with wincing/grimacing/flinching, 3 = pain with withdrawal, 4 = unwilling to allow palpation), (Blank rows = not tested)  Passive Accessory Motion Deferred  Functional Test:  : 19.77 5TSTS: 15.88s  TODAY'S TREATMENT: DATE: 01/31/24  Precaution per MD 10/31: BP should be under 150/90 and above 100/60 in resting position. Systolic of 180-190 with sx prompt emergency care.   Subjective: Patient reports that she felt good this weekend. She was able to walk 1.5 hours with her hurry cane with kids during recent holiday. Patient reports that she has shakiness prior to the session. No questions or concerns at start of session.   Vitals (Seated, Left Brachial):   Blood Pressure: 149/53  HR: 89 bpm   O2: 100%  Therapeutic Activity:   Resisted Sit to Stand - Arms Crossed    2 x 10 - Red TB around knees    1 x 10 - RTB around knees - Feet on Balance Pad    Resisted Marching - 3# AW donned    1 x 10 - RUE Support    3 x 10 - alternating, HH over cubicle   - one instance of LOB, stepping strategy in order to maintain balance     Hurdle Clearance to simulate curb navigation - with hurry cane   2 x 5 small hurdles - 2 instances of minor LOB    2 x 5 small hurdles - 1 instances of  minor LOB, needed PT assistance to maintain balance    2 x 5 small hurdles - 1 instance    Resisted Lateral Stepping   2 x 12' Red TB around thighs    4 x 12' Red TB around ankles    Gait Training:  600' in the gym    - Progressed from Hurry cane to no AD    - Improved step length without AD, no major LOB noted    Pre Intervention - 149/53   Post intervention - 157/51   Stairway Navigation     3 x 4 steps    - able to navigate stairs with hurry cane in RUE  PATIENT EDUCATION:  Education details: HEP, Prognosis, POC  Person educated: Patient and Child(ren) Education method: Explanation, Demonstration, and Handouts Education comprehension: verbalized understanding and returned demonstration  HOME EXERCISE PROGRAM:  Access Code: [email or web address] URL: [email or web address] Date: 01/24/2024 Prepared by: Lonni Pall  Exercises - Standing March with Counter Support  - 1 x daily - 3-4 x weekly - 2-3 sets - 10-12 reps - Feet Together Balance at [employer] Eyes Closed  - 1 x daily - 7 x weekly - 3 sets - 30 hold - Sit to Stand with Armchair  - 1 x daily - 3-4 x weekly - 2-3 sets - 8-10 reps  ASSESSMENT:  CLINICAL IMPRESSION: Patient returned to OPPT for treatment of balance deficits and knee pain. Vital Signs stable throughout session and patient provided appropriate rest breaks between interventions. PT session focused on proper knee  strengthening and balance exercises in order to improve functional mobility. Several instances with LOB requiring UE use on wall or cubicle to maintain balance during activities including SLS . Good tolerance to all interventions. End of session with BP reading of 135/57 no symptoms reported. Patient presents with deficits in LE strength and balance affecting full participation with prolonged walking, recreational activities at the beach and family. Patient will benefit from skilled PT in order to address current deficits and maximize return to  PLOF.   OBJECTIVE IMPAIRMENTS: Abnormal gait, decreased activity tolerance, decreased balance, decreased endurance, difficulty walking, decreased ROM, decreased strength, impaired sensation, and pain.   ACTIVITY LIMITATIONS: carrying, lifting, standing, squatting, and stairs  PARTICIPATION LIMITATIONS: shopping, community activity, and yard work  PERSONAL FACTORS: Age, Past/current experiences, Profession, Sex, Time since onset of injury/illness/exacerbation, and 1-2 comorbidities: HTN, Hx of TIA are also affecting patient's functional outcome.   REHAB POTENTIAL: Fair    CLINICAL DECISION MAKING: Evolving/moderate complexity  EVALUATION COMPLEXITY: Moderate   GOALS: Goals reviewed with patient? Yes  SHORT TERM GOALS: Target date: 03/06/2024  Pt will be independent with HEP to improve strength and decrease knee pain to improve pain-free function at home and work. Baseline: 01/24/2024: Initial HEP provided  Goal status: INITIAL   LONG TERM GOALS: Target date: 04/18/2023  Pt will improve ABC by at least 13% in order to demonstrate clinically significant improvement in balance confidence. Baseline: 01/24/2024: ABC score: 1190 / 1600 = 74.4 % Goal status: INITIAL  2.  Pt will decrease worst knee pain by at least 3 points on the NPRS in order to demonstrate clinically significant reduction in knee pain. Baseline: 01/24/2024: Goal status: INITIAL  3.  Pt will increase LEFS score by at least 9 points in order demonstrate clinically significant reduction in knee pain/disability.       Baseline: 01/24/2024: 48/80 (80/80 = no disability) Goal status: INITIAL  4.  Pt will increase strength of knee flexion/extension by at least 1/2 MMT grade in order to demonstrate improvement in strength and function  Baseline: 01/24/2024: R/L: 4- knee Flexion, R/L 4/5 Knee Extension Goal status: INITIAL  5.  Pt will increase by at least 0.13 m/s in order to demonstrate clinically significant  improvement in community ambulation.  Baseline: 01/24/2024:19.77s Goal status: INITIAL  6.  Pt will decrease 5TSTS by at least 3 seconds in order to demonstrate clinically significant improvement in LE strength and functional ability.  Baseline: 01/24/2024: 15.88s Goal status: INITIAL  PLAN:  PT FREQUENCY: 1-2x/week  PT DURATION: 12 weeks  PLANNED INTERVENTIONS: 97110-Therapeutic exercises, 97530- Therapeutic activity, 97112- Neuromuscular re-education, 97535- Self Care, [id number]- Manual therapy, [phone]- Gait training, Patient/Family education, Balance training, Stair training, Cryotherapy, and Moist heat  PLAN FOR NEXT SESSION: Investment Banker, Operational, Location Manager, LE strengthening  Lonni Pall PT, DPT Physical Therapist- [place]  01/31/2024, 1:46 PM

## 2024-02-02 ENCOUNTER — Other Ambulatory Visit: Payer: Self-pay

## 2024-02-02 ENCOUNTER — Ambulatory Visit

## 2024-02-02 ENCOUNTER — Ambulatory Visit
Admission: RE | Admit: 2024-02-02 | Discharge: 2024-02-02 | Disposition: A | Discharge status: Home / Self Care | Source: Ambulatory Visit | Attending: Radiation Oncology | Admitting: Radiation Oncology

## 2024-02-02 DIAGNOSIS — M1712 Unilateral primary osteoarthritis, left knee: Secondary | ICD-10-CM | POA: Insufficient documentation

## 2024-02-02 DIAGNOSIS — Z510 Encounter for antineoplastic radiation therapy: Secondary | ICD-10-CM | POA: Insufficient documentation

## 2024-02-02 DIAGNOSIS — M6281 Muscle weakness (generalized): Secondary | ICD-10-CM

## 2024-02-02 DIAGNOSIS — G8929 Other chronic pain: Secondary | ICD-10-CM

## 2024-02-02 DIAGNOSIS — R2681 Unsteadiness on feet: Secondary | ICD-10-CM | POA: Diagnosis not present

## 2024-02-02 LAB — RAD ONC ARIA SESSION SUMMARY
Course Elapsed Days: 0
Plan Fractions Treated to Date: 1
Plan Prescribed Dose Per Fraction: 0.5 Gy
Plan Total Fractions Prescribed: 6
Plan Total Prescribed Dose: 3 Gy
Reference Point Dosage Given to Date: 0.5 Gy
Reference Point Session Dosage Given: 0.5 Gy
Session Number: 1

## 2024-02-02 NOTE — Therapy (Signed)
 OUTPATIENT PHYSICAL THERAPY KNEE/BALANCE TREATMENT  Patient Name: Kiara [name] MRN: [id number] DOB:Apr 16, 1934, 89 y.o., female Today's Date: 02/02/2024  END OF SESSION:  PT End of Session - 02/02/24 1112     Visit Number 3    Number of Visits 17    Date for Recertification  03/20/24    Authorization Type 10/27 - 12/31  Authorization #[id number] auth 18 visits    Authorization Time Period 10/27- 12/31    Authorization - Visit Number 3    Authorization - Number of Visits 18    Progress Note Due on Visit 10    PT Start Time 1115    PT Stop Time 1155    PT Time Calculation (min) 40 min    Activity Tolerance Patient tolerated treatment well    Behavior During Therapy [facility] for tasks assessed/performed            Past Medical History:  Diagnosis Date   Arthritis    osteoarthritis -right knee.   C. difficile colitis    past,no recent issues   Colitis    Diverticulitis    Fall    tripped-10 days ago-scrapped right knee and lateral right brow area- healing   GERD (gastroesophageal reflux disease)    occ.   Seasonal allergies    Past Surgical History:  Procedure Laterality Date   APPENDECTOMY     CATARACT EXTRACTION, BILATERAL     EYE SURGERY     ? right eye retina repair.   HEMORRHOID SURGERY     KNEE ARTHROSCOPY Right 11/26/2014   Procedure: ARTHROSCOPY KNEE, Tear medical horn and anterior horn. Lateral mesicus tear, grade 3 medial;  Surgeon: Kiara SHAUNNA Hue, MD;  Location: [facility];  Service: Orthopedics;  Laterality: Right;   MOLE REMOVAL     benign   TONSILLECTOMY     TOTAL KNEE ARTHROPLASTY Right 02/12/2016   Procedure: RIGHT TOTAL KNEE ARTHROPLASTY;  Surgeon: Kiara Moan, MD;  Location: [facility];  Service: Orthopedics;  Laterality: Right;   Patient Active Problem List   Diagnosis Date Noted   Mild cognitive impairment 02/02/2022   TIA (transient ischemic attack) 08/08/2020   Degenerative lumbar spinal stenosis 06/29/2019   Pulmonary nodules/lesions,  multiple 02/08/2019   Exudative age-related macular degeneration of left eye with active choroidal neovascularization (HCC) 01/20/2019   Idiopathic peripheral neuropathy 01/20/2019   Radial styloid tenosynovitis 03/28/2018   Pain in right knee 02/23/2018   History of Clostridium difficile colitis 12/16/2017   Irritable bowel syndrome without diarrhea 12/16/2017   Cataract cortical, senile 08/03/2017   Chronic colitis 08/03/2017   Medicare annual wellness visit, initial 12/15/2016   Benign essential hypertension 11/04/2016   OA (osteoarthritis) of knee 02/12/2016   Hyperlipidemia, mixed 10/24/2015   Clostridium difficile colitis 01/02/2014   Right bundle branch block 10/18/2013    PCP: Kiara Oneil FALCON, MD  REFERRING PROVIDER: Marchia Drivers, MD  REFERRING DIAG: M17.0 (ICD-10-CM) - Primary osteoarthritis of both knees   RATIONALE FOR EVALUATION AND TREATMENT: Rehabilitation  THERAPY DIAG: Unsteadiness on feet  Muscle weakness (generalized)  Chronic pain of both knees  ONSET DATE: Chronic     FOLLOW-UP APPT SCHEDULED WITH REFERRING PROVIDER: No    SUBJECTIVE:  SUBJECTIVE STATEMENT:    Patient is an 89 year old female presenting with unsteadiness and R/L knee pain.   PERTINENT HISTORY:   Kiara BURGENER is an 89 year old female presenting to OPPT with a chief concern of balance deficits and bilateral knee pain. She has a history of TIA, falls and lumbar spinal stenosis and R TKA. Patient received corticosteroid injection the L knee. History of balance deficits primarily due to baseline peripheral neuropathy in bilateral feet contributing to difficulty walking. She reports that her balance has worsened within the last month. Patient currently walking with hurry cane in the community. She reports  that she drifts to one side (L > R) while walking. Patient reports that she will have directed radiation towards the L knee performed by her oncologist.   R Hand Dominant.   Imaging: No   PAIN:   Pain Intensity: Present: 0/10, Best: 0/10, Worst: 9/10 Pain location: bilateral knees  Pain quality: intermittent and sharp  Radiating pain: No  Swelling: No  Numbness/Tingling: Yes Focal weakness or buckling: No Aggravating factors: Walking Relieving factors: Sitting   PRECAUTIONS: Fall  WEIGHT BEARING RESTRICTIONS: No  FALLS: Has patient fallen in last 6 months? Yes. Number of falls 1  Living Environment Lives with: lives alone Lives in: House/apartment Stairs: No Has following equipment at home: Vannie - 4 wheeled and hurry cane  Prior level of function: Independent  Occupational demands: Retired  Presenter, Broadcasting: Taking care of her dog, walking   Patient Goals: I would like to be able to walk without assistance    OBJECTIVE:   PATIENT SURVEYS:  LEFS: 48/80 (80/80 = no disability) ABC: 74/100% - Moderate confidence    Cognition Patient is oriented to person, place, and time.  Recent memory is intact.  Accompanied by daughter: Kiara [name]   Gross Musculoskeletal Assessment Tremor: None Bulk: Normal Tone: Normal  GAIT: Distance walked: 84m  Assistive device utilized: hurry cane Level of assistance: Modified independence Comments: R hand hurry cane, reciprocal gait   Posture:  AROM AROM (Normal range in degrees) AROM  Hip Right Left  Flexion (125)    Extension (15)    Abduction (40)    Adduction     Internal Rotation (45)    External Rotation (45)        Knee    Flexion (135)    Extension (0)        Ankle    Dorsiflexion (20)    Plantarflexion (50)    Inversion (35)    Eversion (15    (* = pain; Blank rows = not tested)  LE MMT: MMT (out of 5) Right Left  Hip flexion 4- 4-  Hip extension    Hip abduction    Hip adduction    Hip internal rotation     Hip external rotation    Knee flexion 4- 4-  Knee extension 4 4  Ankle dorsiflexion 5 5  Ankle plantarflexion 5 5  Ankle inversion    Ankle eversion    (* = pain; Blank rows = not tested)  Sensation Grossly intact to light touch bilateral LEs as determined by testing dermatomes L2-S2. Proprioception, and hot/cold testing deferred on this date.  Reflexes R/L Knee Jerk (L3/4): 2+/1+  Ankle Jerk (S1/2): 2+/1+  Muscle Length Not tested  Palpation Location LEFT  RIGHT           Quadriceps 0 0  Medial Hamstrings 0 0  Lateral Hamstrings 0 0  Lateral Hamstring tendon  Medial Hamstring tendon    Quadriceps tendon 0 0  Patella    Patellar Tendon    Tibial Tuberosity    Medial joint line    Lateral joint line    MCL    LCL    Adductor Tubercle    Pes Anserine tendon    Infrapatellar fat pad    Fibular head    Popliteal fossa    (Blank rows = not tested) Graded on 0-4 scale (0 = no pain, 1 = pain, 2 = pain with wincing/grimacing/flinching, 3 = pain with withdrawal, 4 = unwilling to allow palpation), (Blank rows = not tested)  Passive Accessory Motion Deferred  Functional Test:  : 19.77 5TSTS: 15.88s  TODAY'S TREATMENT: DATE: 02/02/24  Precaution per MD 10/31: BP should be under 150/90 and above 100/60 in resting position. Systolic of 180-190 with sx prompt emergency care.   Subjective: Patient reports that she feels good today. Able to walk throughout house hurry cane. No further questions or concerns.  Vitals (Seated, Left Brachial):   Blood Pressure: 152/58  HR: 75  O2: 100  Therapeutic Activity:   NuStep L2-1 x 5 min x UE/LE (Seat 8) x > 80 SPM for LE endurance and strength for increased walking capacity; PT manually adjusted resistance throughout bout.   Resisted Sit to Stand - 2 Kg Med Mercer - From NuStep Chair   3 x 10      Hurdle Clearance to simulate curb navigation - with hurry cane   4 x 5 small hurdles - one instance of minor LOB with L  lateral sway, good stepping reaction to maintainbalance       Neuromuscular Re-education( with intent to improve dynamic balance and proprioception during walking):  Resisted Walking around the clinic in order to improve SLS balance, joint proprioception and stepping clearance.   640ft - 3# AW Donned - without hurry cane.  - intermittent periods of a left lateral drift  Weaving through cones - 1.5 ft apart   6 trials - 6x cones - 15' total  Tandem Walking in between cubicle and wall   3 x 12' - intermittent use of UE support - CGA  - 5 consecutive steps without physical assistance   Standing Balance on Blue Balance pad   2 x 30s - Eyes Open - no UE support - Feet Together   2 x 15 -  alternating LE marching - Eyes open - no UE support      PATIENT EDUCATION:  Education details: HEP, Prognosis, POC  Person educated: Patient and Child(ren) Education method: Explanation, Demonstration, and Handouts Education comprehension: verbalized understanding and returned demonstration  HOME EXERCISE PROGRAM:  Access Code: [email or web address] URL: [email or web address] Date: 01/24/2024 Prepared by: Lonni Pall  Exercises - Standing March with Counter Support  - 1 x daily - 3-4 x weekly - 2-3 sets - 10-12 reps - Feet Together Balance at [employer] Eyes Closed  - 1 x daily - 7 x weekly - 3 sets - 30 hold - Sit to Stand with Armchair  - 1 x daily - 3-4 x weekly - 2-3 sets - 8-10 reps  ASSESSMENT:  CLINICAL IMPRESSION: Patient returned to OPPT for treatment of balance deficits and knee pain. Vital Signs stable throughout session and patient provided appropriate rest breaks between interventions. Good carryover with hurdle clearance from previous session. Patient with improved balance throughout session; walked throughout clinic without use of AD. End of session with BP reading of 137/63 folllowing PT interventions.  Patient presents with deficits in LE strength and balance affecting full  participation with prolonged walking, recreational activities at the beach and family. Patient will benefit from skilled PT in order to address current deficits and maximize return to PLOF.   OBJECTIVE IMPAIRMENTS: Abnormal gait, decreased activity tolerance, decreased balance, decreased endurance, difficulty walking, decreased ROM, decreased strength, impaired sensation, and pain.   ACTIVITY LIMITATIONS: carrying, lifting, standing, squatting, and stairs  PARTICIPATION LIMITATIONS: shopping, community activity, and yard work  PERSONAL FACTORS: Age, Past/current experiences, Profession, Sex, Time since onset of injury/illness/exacerbation, and 1-2 comorbidities: HTN, Hx of TIA are also affecting patient's functional outcome.   REHAB POTENTIAL: Fair    CLINICAL DECISION MAKING: Evolving/moderate complexity  EVALUATION COMPLEXITY: Moderate   GOALS: Goals reviewed with patient? Yes  SHORT TERM GOALS: Target date: 03/06/2024  Pt will be independent with HEP to improve strength and decrease knee pain to improve pain-free function at home and work. Baseline: 01/24/2024: Initial HEP provided  Goal status: INITIAL   LONG TERM GOALS: Target date: 04/18/2023  Pt will improve ABC by at least 13% in order to demonstrate clinically significant improvement in balance confidence. Baseline: 01/24/2024: ABC score: 1190 / 1600 = 74.4 % Goal status: INITIAL  2.  Pt will decrease worst knee pain by at least 3 points on the NPRS in order to demonstrate clinically significant reduction in knee pain. Baseline: 01/24/2024: 09/10 NPS Goal status: INITIAL  3.  Pt will increase LEFS score by at least 9 points in order demonstrate clinically significant reduction in knee pain/disability.       Baseline: 01/24/2024: 48/80 (80/80 = no disability) Goal status: INITIAL  4.  Pt will increase strength of knee flexion/extension by at least 1/2 MMT grade in order to demonstrate improvement in strength and  function  Baseline: 01/24/2024: R/L: 4- knee Flexion, R/L 4/5 Knee Extension Goal status: INITIAL  5.  Pt will increase by at least 0.13 m/s in order to demonstrate clinically significant improvement in community ambulation.  Baseline: 01/24/2024:19.77s Goal status: INITIAL  6.  Pt will decrease 5TSTS by at least 3 seconds in order to demonstrate clinically significant improvement in LE strength and functional ability.  Baseline: 01/24/2024: 15.88s Goal status: INITIAL  PLAN:  PT FREQUENCY: 1-2x/week  PT DURATION: 12 weeks  PLANNED INTERVENTIONS: 97110-Therapeutic exercises, 97530- Therapeutic activity, 97112- Neuromuscular re-education, 97535- Self Care, [id number]- Manual therapy, [phone]- Gait training, Patient/Family education, Balance training, Stair training, Cryotherapy, and Moist heat  PLAN FOR NEXT SESSION: Investment Banker, Operational, Location Manager, LE strengthening  Lonni Pall PT, DPT Physical Therapist- [place]  02/02/2024, 11:19 AM

## 2024-02-04 ENCOUNTER — Ambulatory Visit
Admission: RE | Admit: 2024-02-04 | Discharge: 2024-02-04 | Disposition: A | Discharge status: Home / Self Care | Source: Ambulatory Visit | Attending: Radiation Oncology | Admitting: Radiation Oncology

## 2024-02-04 ENCOUNTER — Other Ambulatory Visit: Payer: Self-pay

## 2024-02-04 DIAGNOSIS — Z510 Encounter for antineoplastic radiation therapy: Secondary | ICD-10-CM | POA: Diagnosis not present

## 2024-02-04 LAB — RAD ONC ARIA SESSION SUMMARY
Course Elapsed Days: 2
Plan Fractions Treated to Date: 2
Plan Prescribed Dose Per Fraction: 0.5 Gy
Plan Total Fractions Prescribed: 6
Plan Total Prescribed Dose: 3 Gy
Reference Point Dosage Given to Date: 1 Gy
Reference Point Session Dosage Given: 0.5 Gy
Session Number: 2

## 2024-02-07 ENCOUNTER — Other Ambulatory Visit: Payer: Self-pay

## 2024-02-07 ENCOUNTER — Ambulatory Visit

## 2024-02-07 ENCOUNTER — Ambulatory Visit
Admission: RE | Admit: 2024-02-07 | Discharge: 2024-02-07 | Disposition: A | Discharge status: Home / Self Care | Source: Ambulatory Visit | Attending: Radiation Oncology | Admitting: Radiation Oncology

## 2024-02-07 DIAGNOSIS — Z510 Encounter for antineoplastic radiation therapy: Secondary | ICD-10-CM | POA: Diagnosis not present

## 2024-02-07 LAB — RAD ONC ARIA SESSION SUMMARY
Course Elapsed Days: 5
Plan Fractions Treated to Date: 3
Plan Prescribed Dose Per Fraction: 0.5 Gy
Plan Total Fractions Prescribed: 6
Plan Total Prescribed Dose: 3 Gy
Reference Point Dosage Given to Date: 1.5 Gy
Reference Point Session Dosage Given: 0.5 Gy
Session Number: 3

## 2024-02-08 ENCOUNTER — Ambulatory Visit

## 2024-02-09 ENCOUNTER — Ambulatory Visit

## 2024-02-09 ENCOUNTER — Other Ambulatory Visit: Payer: Self-pay

## 2024-02-09 ENCOUNTER — Ambulatory Visit
Admission: RE | Admit: 2024-02-09 | Discharge: 2024-02-09 | Disposition: A | Discharge status: Home / Self Care | Source: Ambulatory Visit | Attending: Radiation Oncology | Admitting: Radiation Oncology

## 2024-02-09 DIAGNOSIS — Z510 Encounter for antineoplastic radiation therapy: Secondary | ICD-10-CM | POA: Diagnosis not present

## 2024-02-09 DIAGNOSIS — R2681 Unsteadiness on feet: Secondary | ICD-10-CM | POA: Diagnosis not present

## 2024-02-09 DIAGNOSIS — M6281 Muscle weakness (generalized): Secondary | ICD-10-CM

## 2024-02-09 DIAGNOSIS — G8929 Other chronic pain: Secondary | ICD-10-CM

## 2024-02-09 LAB — RAD ONC ARIA SESSION SUMMARY
Course Elapsed Days: 7
Plan Fractions Treated to Date: 4
Plan Prescribed Dose Per Fraction: 0.5 Gy
Plan Total Fractions Prescribed: 6
Plan Total Prescribed Dose: 3 Gy
Reference Point Dosage Given to Date: 2 Gy
Reference Point Session Dosage Given: 0.5 Gy
Session Number: 4

## 2024-02-09 NOTE — Therapy (Signed)
 OUTPATIENT PHYSICAL THERAPY KNEE/BALANCE TREATMENT  Patient Name: Kiara [name] MRN: [id number] DOB:1934/10/25, 89 y.o., female Today's Date: 02/09/2024  END OF SESSION:  PT End of Session - 02/09/24 1606     Visit Number 4    Number of Visits 17    Date for Recertification  03/20/24    Authorization Type 10/27 - 12/31  Authorization #[id number] auth 18 visits    Authorization Time Period 10/27- 12/31    Authorization - Visit Number 4    Authorization - Number of Visits 18    Progress Note Due on Visit 10    PT Start Time 1600    PT Stop Time 1640    PT Time Calculation (min) 40 min    Activity Tolerance Patient tolerated treatment well    Behavior During Therapy WFL for tasks assessed/performed             Past Medical History:  Diagnosis Date   Arthritis    osteoarthritis -right knee.   C. difficile colitis    past,no recent issues   Colitis    Diverticulitis    Fall    tripped-10 days ago-scrapped right knee and lateral right brow area- healing   GERD (gastroesophageal reflux disease)    occ.   Seasonal allergies    Past Surgical History:  Procedure Laterality Date   APPENDECTOMY     CATARACT EXTRACTION, BILATERAL     EYE SURGERY     ? right eye retina repair.   HEMORRHOID SURGERY     KNEE ARTHROSCOPY Right 11/26/2014   Procedure: ARTHROSCOPY KNEE, Tear medical horn and anterior horn. Lateral mesicus tear, grade 3 medial;  Surgeon: Lynwood SHAUNNA Hue, MD;  Location: [facility];  Service: Orthopedics;  Laterality: Right;   MOLE REMOVAL     benign   TONSILLECTOMY     TOTAL KNEE ARTHROPLASTY Right 02/12/2016   Procedure: RIGHT TOTAL KNEE ARTHROPLASTY;  Surgeon: Dempsey Moan, MD;  Location: [facility];  Service: Orthopedics;  Laterality: Right;   Patient Active Problem List   Diagnosis Date Noted   Mild cognitive impairment 02/02/2022   TIA (transient ischemic attack) 08/08/2020   Degenerative lumbar spinal stenosis 06/29/2019   Pulmonary nodules/lesions,  multiple 02/08/2019   Exudative age-related macular degeneration of left eye with active choroidal neovascularization (HCC) 01/20/2019   Idiopathic peripheral neuropathy 01/20/2019   Radial styloid tenosynovitis 03/28/2018   Pain in right knee 02/23/2018   History of Clostridium difficile colitis 12/16/2017   Irritable bowel syndrome without diarrhea 12/16/2017   Cataract cortical, senile 08/03/2017   Chronic colitis 08/03/2017   Medicare annual wellness visit, initial 12/15/2016   Benign essential hypertension 11/04/2016   OA (osteoarthritis) of knee 02/12/2016   Hyperlipidemia, mixed 10/24/2015   Clostridium difficile colitis 01/02/2014   Right bundle branch block 10/18/2013    PCP: Cleotilde Oneil FALCON, MD  REFERRING PROVIDER: Marchia Drivers, MD  REFERRING DIAG: M17.0 (ICD-10-CM) - Primary osteoarthritis of both knees   RATIONALE FOR EVALUATION AND TREATMENT: Rehabilitation  THERAPY DIAG: Unsteadiness on feet  Muscle weakness (generalized)  Chronic pain of both knees  ONSET DATE: Chronic     FOLLOW-UP APPT SCHEDULED WITH REFERRING PROVIDER: No    SUBJECTIVE:  SUBJECTIVE STATEMENT:    Patient is an 89 year old female presenting with unsteadiness and R/L knee pain.   PERTINENT HISTORY:   Kiara [name] is an 89 year old female presenting to OPPT with a chief concern of balance deficits and bilateral knee pain. She has a history of TIA, falls and lumbar spinal stenosis and R TKA. Patient received corticosteroid injection the L knee. History of balance deficits primarily due to baseline peripheral neuropathy in bilateral feet contributing to difficulty walking. She reports that her balance has worsened within the last month. Patient currently walking with hurry cane in the community. She reports  that she drifts to one side (L > R) while walking. Patient reports that she will have directed radiation towards the L knee performed by her oncologist.   R Hand Dominant.   Imaging: No   PAIN:   Pain Intensity: Present: 0/10, Best: 0/10, Worst: 9/10 Pain location: bilateral knees  Pain quality: intermittent and sharp  Radiating pain: No  Swelling: No  Numbness/Tingling: Yes Focal weakness or buckling: No Aggravating factors: Walking Relieving factors: Sitting   PRECAUTIONS: Fall  WEIGHT BEARING RESTRICTIONS: No  FALLS: Has patient fallen in last 6 months? Yes. Number of falls 1  Living Environment Lives with: lives alone Lives in: House/apartment Stairs: No Has following equipment at home: Vannie - 4 wheeled and hurry cane  Prior level of function: Independent  Occupational demands: Retired  Presenter, Broadcasting: Taking care of her dog, walking   Patient Goals: I would like to be able to walk without assistance    OBJECTIVE:   PATIENT SURVEYS:  LEFS: 48/80 (80/80 = no disability) ABC: 74/100% - Moderate confidence    Cognition Patient is oriented to person, place, and time.  Recent memory is intact.  Accompanied by daughter: [name]   Gross Musculoskeletal Assessment Tremor: None Bulk: Normal Tone: Normal  GAIT: Distance walked: 85m  Assistive device utilized: hurry cane Level of assistance: Modified independence Comments: R hand hurry cane, reciprocal gait   Posture:  AROM AROM (Normal range in degrees) AROM  Hip Right Left  Flexion (125)    Extension (15)    Abduction (40)    Adduction     Internal Rotation (45)    External Rotation (45)        Knee    Flexion (135)    Extension (0)        Ankle    Dorsiflexion (20)    Plantarflexion (50)    Inversion (35)    Eversion (15    (* = pain; Blank rows = not tested)  LE MMT: MMT (out of 5) Right Left  Hip flexion 4- 4-  Hip extension    Hip abduction    Hip adduction    Hip internal rotation     Hip external rotation    Knee flexion 4- 4-  Knee extension 4 4  Ankle dorsiflexion 5 5  Ankle plantarflexion 5 5  Ankle inversion    Ankle eversion    (* = pain; Blank rows = not tested)  Sensation Grossly intact to light touch bilateral LEs as determined by testing dermatomes L2-S2. Proprioception, and hot/cold testing deferred on this date.  Reflexes R/L Knee Jerk (L3/4): 2+/1+  Ankle Jerk (S1/2): 2+/1+  Muscle Length Not tested  Palpation Location LEFT  RIGHT           Quadriceps 0 0  Medial Hamstrings 0 0  Lateral Hamstrings 0 0  Lateral Hamstring tendon  Medial Hamstring tendon    Quadriceps tendon 0 0  Patella    Patellar Tendon    Tibial Tuberosity    Medial joint line    Lateral joint line    MCL    LCL    Adductor Tubercle    Pes Anserine tendon    Infrapatellar fat pad    Fibular head    Popliteal fossa    (Blank rows = not tested) Graded on 0-4 scale (0 = no pain, 1 = pain, 2 = pain with wincing/grimacing/flinching, 3 = pain with withdrawal, 4 = unwilling to allow palpation), (Blank rows = not tested)  Passive Accessory Motion Deferred  Functional Test:  : 19.77 5TSTS: 15.88s  TODAY'S TREATMENT: DATE: 02/09/24  Precaution per MD 10/31: BP should be under 150/90 and above 100/60 in resting position. Systolic of 180-190 with sx prompt emergency care.   Subjective: Patient reports some pain in the L knee. She reports that she had the low dose radiation applied in the L knee. She had a lot of medical appointments prior to PT session. No further questions or concerns.  Vitals (Seated, Left Brachial):   Blood Pressure: 123/54  HR: 88  O2: 100  Therapeutic Activity:   NuStep L2-1 x 5 min x UE/LE (Seat 8) x > 80 SPM for LE endurance and strength for increased walking capacity; PT manually adjusted resistance throughout bout.   Resisted Sit to Stand - 2 Kg Med [place]cer - From NuStep Chair   3 x 10      Sled Push (Seated rest break)    2 x  10 m (70#)   2 x 10 m     Hurdle Clearance to simulate curb navigation - with hurry cane   4 x 5 small hurdles - one instance of minor LOB with L lateral sway, good stepping reaction to maintain balance       Neuromuscular Re-education( with intent to improve dynamic balance and proprioception during walking):  Outdoor walking with various tasks: forward/retro/side step Walking in the grass, curb navigation  8 min - CGA - no hurry cane    - 4 instances of minor LOB with stepping strategy to maintain balance.   Weaving through cones - 1.5 ft apart   6 trials - 6x cones - 15' total  Tandem Walking in between cubicle and wall   3 x 12' - intermittent use of UE support - CGA    - Able to achieve 12 steps    PATIENT EDUCATION:  Education details: Exercise Technique  Person educated: Patient and Child(ren) Education method: Explanation, Demonstration, and Handouts Education comprehension: verbalized understanding and returned demonstration  HOME EXERCISE PROGRAM:  Access Code: [email or web address] URL: [email or web address] Date: 01/24/2024 Prepared by: Lonni Pall  Exercises - Standing March with Counter Support  - 1 x daily - 3-4 x weekly - 2-3 sets - 10-12 reps - Feet Together Balance at [employer] Eyes Closed  - 1 x daily - 7 x weekly - 3 sets - 30 hold - Sit to Stand with Armchair  - 1 x daily - 3-4 x weekly - 2-3 sets - 8-10 reps  ASSESSMENT:  CLINICAL IMPRESSION: Patient returned to OPPT for treatment of balance deficits and knee pain. Vital Signs stable throughout session and patient provided appropriate rest breaks between interventions. Patient able to perform increase number of tandem stepping without major LOB. Intermittent periods throughout the session where PT provided physical assistance in order to maintain balance. Patient with  improved balance throughout session; walked throughout clinic without use of AD. End of session with BP reading of 115/58 folllowing  PT interventions.  Patient presents with deficits in LE strength and balance affecting full participation with prolonged walking, recreational activities at the beach and family. Patient will benefit from skilled PT in order to address current deficits and maximize return to PLOF.   OBJECTIVE IMPAIRMENTS: Abnormal gait, decreased activity tolerance, decreased balance, decreased endurance, difficulty walking, decreased ROM, decreased strength, impaired sensation, and pain.   ACTIVITY LIMITATIONS: carrying, lifting, standing, squatting, and stairs  PARTICIPATION LIMITATIONS: shopping, community activity, and yard work  PERSONAL FACTORS: Age, Past/current experiences, Profession, Sex, Time since onset of injury/illness/exacerbation, and 1-2 comorbidities: HTN, Hx of TIA are also affecting patient's functional outcome.   REHAB POTENTIAL: Fair    CLINICAL DECISION MAKING: Evolving/moderate complexity  EVALUATION COMPLEXITY: Moderate   GOALS: Goals reviewed with patient? Yes  SHORT TERM GOALS: Target date: 03/06/2024  Pt will be independent with HEP to improve strength and decrease knee pain to improve pain-free function at home and work. Baseline: 01/24/2024: Initial HEP provided  Goal status: INITIAL   LONG TERM GOALS: Target date: 04/18/2023  Pt will improve ABC by at least 13% in order to demonstrate clinically significant improvement in balance confidence. Baseline: 01/24/2024: ABC score: 1190 / 1600 = 74.4 % Goal status: INITIAL  2.  Pt will decrease worst knee pain by at least 3 points on the NPRS in order to demonstrate clinically significant reduction in knee pain. Baseline: 01/24/2024: 09/10 NPS Goal status: INITIAL  3.  Pt will increase LEFS score by at least 9 points in order demonstrate clinically significant reduction in knee pain/disability.       Baseline: 01/24/2024: 48/80 (80/80 = no disability) Goal status: INITIAL  4.  Pt will increase strength of knee  flexion/extension by at least 1/2 MMT grade in order to demonstrate improvement in strength and function  Baseline: 01/24/2024: R/L: 4- knee Flexion, R/L 4/5 Knee Extension Goal status: INITIAL  5.  Pt will increase by at least 0.13 m/s in order to demonstrate clinically significant improvement in community ambulation.  Baseline: 01/24/2024:19.77s Goal status: INITIAL  6.  Pt will decrease 5TSTS by at least 3 seconds in order to demonstrate clinically significant improvement in LE strength and functional ability.  Baseline: 01/24/2024: 15.88s Goal status: INITIAL  PLAN:  PT FREQUENCY: 1-2x/week  PT DURATION: 12 weeks  PLANNED INTERVENTIONS: 97110-Therapeutic exercises, 97530- Therapeutic activity, 97112- Neuromuscular re-education, 97535- Self Care, [id number]- Manual therapy, [phone]- Gait training, Patient/Family education, Balance training, Stair training, Cryotherapy, and Moist heat  PLAN FOR NEXT SESSION: Investment Banker, Operational, Location Manager, LE strengthening  Lonni Pall PT, DPT Physical Therapist- [place]  02/09/2024, 4:07 PM

## 2024-02-10 ENCOUNTER — Ambulatory Visit

## 2024-02-14 ENCOUNTER — Ambulatory Visit

## 2024-02-14 ENCOUNTER — Other Ambulatory Visit: Payer: Self-pay

## 2024-02-14 ENCOUNTER — Ambulatory Visit
Admission: RE | Admit: 2024-02-14 | Discharge: 2024-02-14 | Disposition: A | Discharge status: Home / Self Care | Source: Ambulatory Visit | Attending: Radiation Oncology | Admitting: Radiation Oncology

## 2024-02-14 DIAGNOSIS — Z510 Encounter for antineoplastic radiation therapy: Secondary | ICD-10-CM | POA: Diagnosis not present

## 2024-02-14 DIAGNOSIS — R2681 Unsteadiness on feet: Secondary | ICD-10-CM

## 2024-02-14 DIAGNOSIS — G8929 Other chronic pain: Secondary | ICD-10-CM

## 2024-02-14 DIAGNOSIS — M6281 Muscle weakness (generalized): Secondary | ICD-10-CM

## 2024-02-14 LAB — RAD ONC ARIA SESSION SUMMARY
Course Elapsed Days: 12
Plan Fractions Treated to Date: 5
Plan Prescribed Dose Per Fraction: 0.5 Gy
Plan Total Fractions Prescribed: 6
Plan Total Prescribed Dose: 3 Gy
Reference Point Dosage Given to Date: 2.5 Gy
Reference Point Session Dosage Given: 0.5 Gy
Session Number: 5

## 2024-02-14 NOTE — Therapy (Signed)
 OUTPATIENT PHYSICAL THERAPY KNEE/BALANCE TREATMENT  Patient Name: Kiara [name] MRN: [id number] DOB:12-24-1934, 89 y.o., female Today's Date: 02/14/2024  END OF SESSION:  PT End of Session - 02/14/24 1434     Visit Number 5    Number of Visits 17    Date for Recertification  03/20/24    Authorization Type 10/27 - 12/31  Authorization #[id number] auth 18 visits    Authorization Time Period 10/27- 12/31    Authorization - Visit Number 5    Authorization - Number of Visits 18    Progress Note Due on Visit 10    PT Start Time 1432    PT Stop Time 1510    PT Time Calculation (min) 38 min    Activity Tolerance Patient tolerated treatment well    Behavior During Therapy WFL for tasks assessed/performed             Past Medical History:  Diagnosis Date   Arthritis    osteoarthritis -right knee.   C. difficile colitis    past,no recent issues   Colitis    Diverticulitis    Fall    tripped-10 days ago-scrapped right knee and lateral right brow area- healing   GERD (gastroesophageal reflux disease)    occ.   Seasonal allergies    Past Surgical History:  Procedure Laterality Date   APPENDECTOMY     CATARACT EXTRACTION, BILATERAL     EYE SURGERY     ? right eye retina repair.   HEMORRHOID SURGERY     KNEE ARTHROSCOPY Right 11/26/2014   Procedure: ARTHROSCOPY KNEE, Tear medical horn and anterior horn. Lateral mesicus tear, grade 3 medial;  Surgeon: Lynwood SHAUNNA Hue, MD;  Location: [facility];  Service: Orthopedics;  Laterality: Right;   MOLE REMOVAL     benign   TONSILLECTOMY     TOTAL KNEE ARTHROPLASTY Right 02/12/2016   Procedure: RIGHT TOTAL KNEE ARTHROPLASTY;  Surgeon: Dempsey Moan, MD;  Location: [facility];  Service: Orthopedics;  Laterality: Right;   Patient Active Problem List   Diagnosis Date Noted   Mild cognitive impairment 02/02/2022   TIA (transient ischemic attack) 08/08/2020   Degenerative lumbar spinal stenosis 06/29/2019   Pulmonary nodules/lesions,  multiple 02/08/2019   Exudative age-related macular degeneration of left eye with active choroidal neovascularization (HCC) 01/20/2019   Idiopathic peripheral neuropathy 01/20/2019   Radial styloid tenosynovitis 03/28/2018   Pain in right knee 02/23/2018   History of Clostridium difficile colitis 12/16/2017   Irritable bowel syndrome without diarrhea 12/16/2017   Cataract cortical, senile 08/03/2017   Chronic colitis 08/03/2017   Medicare annual wellness visit, initial 12/15/2016   Benign essential hypertension 11/04/2016   OA (osteoarthritis) of knee 02/12/2016   Hyperlipidemia, mixed 10/24/2015   Clostridium difficile colitis 01/02/2014   Right bundle branch block 10/18/2013    PCP: Cleotilde Oneil FALCON, MD  REFERRING PROVIDER: Marchia Drivers, MD  REFERRING DIAG: M17.0 (ICD-10-CM) - Primary osteoarthritis of both knees   RATIONALE FOR EVALUATION AND TREATMENT: Rehabilitation  THERAPY DIAG: Unsteadiness on feet  Muscle weakness (generalized)  Chronic pain of both knees  ONSET DATE: Chronic     FOLLOW-UP APPT SCHEDULED WITH REFERRING PROVIDER: No    SUBJECTIVE:  SUBJECTIVE STATEMENT:    Patient is an 89 year old female presenting with unsteadiness and R/L knee pain.   PERTINENT HISTORY:   Kiara [name] is an 89 year old female presenting to OPPT with a chief concern of balance deficits and bilateral knee pain. She has a history of TIA, falls and lumbar spinal stenosis and R TKA. Patient received corticosteroid injection the L knee. History of balance deficits primarily due to baseline peripheral neuropathy in bilateral feet contributing to difficulty walking. She reports that her balance has worsened within the last month. Patient currently walking with hurry cane in the community. She reports  that she drifts to one side (L > R) while walking. Patient reports that she will have directed radiation towards the L knee performed by her oncologist.   R Hand Dominant.   Imaging: No   PAIN:   Pain Intensity: Present: 0/10, Best: 0/10, Worst: 9/10 Pain location: bilateral knees  Pain quality: intermittent and sharp  Radiating pain: No  Swelling: No  Numbness/Tingling: Yes Focal weakness or buckling: No Aggravating factors: Walking Relieving factors: Sitting   PRECAUTIONS: Fall  WEIGHT BEARING RESTRICTIONS: No  FALLS: Has patient fallen in last 6 months? Yes. Number of falls 1  Living Environment Lives with: lives alone Lives in: House/apartment Stairs: No Has following equipment at home: Vannie - 4 wheeled and hurry cane  Prior level of function: Independent  Occupational demands: Retired  Presenter, Broadcasting: Taking care of her dog, walking   Patient Goals: I would like to be able to walk without assistance    OBJECTIVE:   PATIENT SURVEYS:  LEFS: 48/80 (80/80 = no disability) ABC: 74/100% - Moderate confidence    Cognition Patient is oriented to person, place, and time.  Recent memory is intact.  Accompanied by daughter: [name]   Gross Musculoskeletal Assessment Tremor: None Bulk: Normal Tone: Normal  GAIT: Distance walked: 54m  Assistive device utilized: hurry cane Level of assistance: Modified independence Comments: R hand hurry cane, reciprocal gait   Posture:  AROM AROM (Normal range in degrees) AROM  Hip Right Left  Flexion (125)    Extension (15)    Abduction (40)    Adduction     Internal Rotation (45)    External Rotation (45)        Knee    Flexion (135)    Extension (0)        Ankle    Dorsiflexion (20)    Plantarflexion (50)    Inversion (35)    Eversion (15    (* = pain; Blank rows = not tested)  LE MMT: MMT (out of 5) Right Left  Hip flexion 4- 4-  Hip extension    Hip abduction    Hip adduction    Hip internal rotation     Hip external rotation    Knee flexion 4- 4-  Knee extension 4 4  Ankle dorsiflexion 5 5  Ankle plantarflexion 5 5  Ankle inversion    Ankle eversion    (* = pain; Blank rows = not tested)  Sensation Grossly intact to light touch bilateral LEs as determined by testing dermatomes L2-S2. Proprioception, and hot/cold testing deferred on this date.  Reflexes R/L Knee Jerk (L3/4): 2+/1+  Ankle Jerk (S1/2): 2+/1+  Muscle Length Not tested  Palpation Location LEFT  RIGHT           Quadriceps 0 0  Medial Hamstrings 0 0  Lateral Hamstrings 0 0  Lateral Hamstring tendon  Medial Hamstring tendon    Quadriceps tendon 0 0  Patella    Patellar Tendon    Tibial Tuberosity    Medial joint line    Lateral joint line    MCL    LCL    Adductor Tubercle    Pes Anserine tendon    Infrapatellar fat pad    Fibular head    Popliteal fossa    (Blank rows = not tested) Graded on 0-4 scale (0 = no pain, 1 = pain, 2 = pain with wincing/grimacing/flinching, 3 = pain with withdrawal, 4 = unwilling to allow palpation), (Blank rows = not tested)  Passive Accessory Motion Deferred  Functional Test:  : 19.77 5TSTS: 15.88s  TODAY'S TREATMENT: DATE: 02/14/24  Precaution per MD 10/31: BP should be under 150/90 and above 100/60 in resting position. Systolic of 180-190 with sx prompt emergency care.   Subjective: Patient reports minimal pain in the L knee following low dose radiation therapy.   Vitals (Seated, Left Brachial):   Blood Pressure: 123/55  HR: 72  O2: 100  Therapeutic Activity:   NuStep L6-2 x 6 min x UE/LE (Seat 8) x > 80 SPM for LE endurance and strength for increased walking capacity; PT manually adjusted resistance throughout bout.   Sit to Stand from Arm Chair  3 x 10 - no UE support     Forward Step Up - BUE Support on Rail    R/L: 2 x 10 reps   Neuromuscular Re-education( with intent to improve dynamic balance and proprioception during walking):  Uneven  Surface Training using large mat and random hedgehog balls underneath   8 min - hurry cane - SBA, a couple instances of minor LOB but able to maintain balance, good use of ankle and hip strategies to maintain balance.   Tandem Walking in between cubicle and wall   3 x 12' - intermittent use of UE support - CGA    - Able to achieve 7 consecutive steps  Side Stepping on balance beam   4x - a couple instances of UE use on cubicle to maintain balane   Retro Stepping with hurry cane   4 x 12' - wider bos and good ability to perform consecutive steps without LOB.  PATIENT EDUCATION:  Education details: Exercise Technique  Person educated: Patient and Child(ren) Education method: Explanation, Demonstration, and Handouts Education comprehension: verbalized understanding and returned demonstration  HOME EXERCISE PROGRAM:  Access Code: [email or web address] URL: [email or web address] Date: 01/24/2024 Prepared by: Lonni Pall  Exercises - Standing March with Counter Support  - 1 x daily - 3-4 x weekly - 2-3 sets - 10-12 reps - Feet Together Balance at [employer] Eyes Closed  - 1 x daily - 7 x weekly - 3 sets - 30 hold - Sit to Stand with Armchair  - 1 x daily - 3-4 x weekly - 2-3 sets - 8-10 reps  ASSESSMENT:  CLINICAL IMPRESSION: Patient returned to OPPT for treatment of balance deficits and knee pain. Vital Signs stable throughout session and patient provided appropriate rest breaks between interventions. Patient able to navigate compliant surfaces without major LOB using hurry cane. Challenged patient's dynamic balance in various tasks such as retro stepping, side stepping and cross over stepping (for narrow BoS). She continues to demonstrate improvements to her balance since the start of POC. However, Patient presents with deficits in LE strength and balance affecting full participation with prolonged walking, recreational activities at the beach and family. Patient will benefit from  skilled PT in order to address current deficits and maximize return to PLOF.    OBJECTIVE IMPAIRMENTS: Abnormal gait, decreased activity tolerance, decreased balance, decreased endurance, difficulty walking, decreased ROM, decreased strength, impaired sensation, and pain.   ACTIVITY LIMITATIONS: carrying, lifting, standing, squatting, and stairs  PARTICIPATION LIMITATIONS: shopping, community activity, and yard work  PERSONAL FACTORS: Age, Past/current experiences, Profession, Sex, Time since onset of injury/illness/exacerbation, and 1-2 comorbidities: HTN, Hx of TIA are also affecting patient's functional outcome.   REHAB POTENTIAL: Fair    CLINICAL DECISION MAKING: Evolving/moderate complexity  EVALUATION COMPLEXITY: Moderate   GOALS: Goals reviewed with patient? Yes  SHORT TERM GOALS: Target date: 03/06/2024  Pt will be independent with HEP to improve strength and decrease knee pain to improve pain-free function at home and work. Baseline: 01/24/2024: Initial HEP provided  Goal status: INITIAL   LONG TERM GOALS: Target date: 04/18/2023  Pt will improve ABC by at least 13% in order to demonstrate clinically significant improvement in balance confidence. Baseline: 01/24/2024: ABC score: 1190 / 1600 = 74.4 % Goal status: INITIAL  2.  Pt will decrease worst knee pain by at least 3 points on the NPRS in order to demonstrate clinically significant reduction in knee pain. Baseline: 01/24/2024: 09/10 NPS Goal status: INITIAL  3.  Pt will increase LEFS score by at least 9 points in order demonstrate clinically significant reduction in knee pain/disability.       Baseline: 01/24/2024: 48/80 (80/80 = no disability) Goal status: INITIAL  4.  Pt will increase strength of knee flexion/extension by at least 1/2 MMT grade in order to demonstrate improvement in strength and function  Baseline: 01/24/2024: R/L: 4- knee Flexion, R/L 4/5 Knee Extension Goal status: INITIAL  5.  Pt will  increase by at least 0.13 m/s in order to demonstrate clinically significant improvement in community ambulation.  Baseline: 01/24/2024:19.77s without AD  Goal status: INITIAL  6.  Pt will decrease 5TSTS by at least 3 seconds in order to demonstrate clinically significant improvement in LE strength and functional ability.  Baseline: 01/24/2024: 15.88s Goal status: INITIAL  PLAN:  PT FREQUENCY: 1-2x/week  PT DURATION: 12 weeks  PLANNED INTERVENTIONS: 97110-Therapeutic exercises, 97530- Therapeutic activity, 97112- Neuromuscular re-education, 97535- Self Care, [id number]- Manual therapy, [phone]- Gait training, Patient/Family education, Balance training, Stair training, Cryotherapy, and Moist heat  PLAN FOR NEXT SESSION: Investment Banker, Operational, Location Manager, LE strengthening  Lonni Pall PT, DPT Physical Therapist- [place]  02/14/2024, 6:38 PM

## 2024-02-15 ENCOUNTER — Ambulatory Visit

## 2024-02-16 ENCOUNTER — Ambulatory Visit
Admission: RE | Admit: 2024-02-16 | Discharge: 2024-02-16 | Disposition: A | Discharge status: Home / Self Care | Source: Ambulatory Visit | Attending: Radiation Oncology | Admitting: Radiation Oncology

## 2024-02-16 ENCOUNTER — Ambulatory Visit

## 2024-02-16 ENCOUNTER — Other Ambulatory Visit: Payer: Self-pay

## 2024-02-16 DIAGNOSIS — Z510 Encounter for antineoplastic radiation therapy: Secondary | ICD-10-CM | POA: Diagnosis not present

## 2024-02-16 LAB — RAD ONC ARIA SESSION SUMMARY
Course Elapsed Days: 14
Plan Fractions Treated to Date: 6
Plan Prescribed Dose Per Fraction: 0.5 Gy
Plan Total Fractions Prescribed: 6
Plan Total Prescribed Dose: 3 Gy
Reference Point Dosage Given to Date: 3 Gy
Reference Point Session Dosage Given: 0.5 Gy
Session Number: 6

## 2024-02-17 ENCOUNTER — Ambulatory Visit

## 2024-02-17 DIAGNOSIS — R2681 Unsteadiness on feet: Secondary | ICD-10-CM | POA: Diagnosis not present

## 2024-02-17 DIAGNOSIS — M6281 Muscle weakness (generalized): Secondary | ICD-10-CM

## 2024-02-17 DIAGNOSIS — G8929 Other chronic pain: Secondary | ICD-10-CM

## 2024-02-17 NOTE — Therapy (Signed)
 OUTPATIENT PHYSICAL THERAPY KNEE/BALANCE TREATMENT  Patient Name: Kiara [name] MRN: [id number] DOB:[birth year], 89 y.o., female Today's Date: 02/17/2024  END OF SESSION:  PT End of Session - 02/17/24 1304     Visit Number 6    Number of Visits 17    Date for Recertification  03/20/24    Authorization Type 10/27 - 12/31  Authorization #[id number] auth 18 visits    Authorization Time Period 10/27- 12/31    Authorization - Visit Number 6    Authorization - Number of Visits 18    Progress Note Due on Visit 10    PT Start Time 1304    PT Stop Time 1345    PT Time Calculation (min) 41 min    Activity Tolerance Patient tolerated treatment well    Behavior During Therapy WFL for tasks assessed/performed              Past Medical History:  Diagnosis Date   Arthritis    osteoarthritis -right knee.   C. difficile colitis    past,no recent issues   Colitis    Diverticulitis    Fall    tripped-10 days ago-scrapped right knee and lateral right brow area- healing   GERD (gastroesophageal reflux disease)    occ.   Seasonal allergies    Past Surgical History:  Procedure Laterality Date   APPENDECTOMY     CATARACT EXTRACTION, BILATERAL     EYE SURGERY     ? right eye retina repair.   HEMORRHOID SURGERY     KNEE ARTHROSCOPY Right 11/26/2014   Procedure: ARTHROSCOPY KNEE, Tear medical horn and anterior horn. Lateral mesicus tear, grade 3 medial;  Surgeon: Kiara SHAUNNA Hue, MD;  Location: [facility];  Service: Orthopedics;  Laterality: Right;   MOLE REMOVAL     benign   TONSILLECTOMY     TOTAL KNEE ARTHROPLASTY Right 02/12/2016   Procedure: RIGHT TOTAL KNEE ARTHROPLASTY;  Surgeon: Kiara Moan, MD;  Location: [facility];  Service: Orthopedics;  Laterality: Right;   Patient Active Problem List   Diagnosis Date Noted   Mild cognitive impairment 02/02/2022   TIA (transient ischemic attack) 08/08/2020   Degenerative lumbar spinal stenosis 06/29/2019   Pulmonary nodules/lesions,  multiple 02/08/2019   Exudative age-related macular degeneration of left eye with active choroidal neovascularization (HCC) 01/20/2019   Idiopathic peripheral neuropathy 01/20/2019   Radial styloid tenosynovitis 03/28/2018   Pain in right knee 02/23/2018   History of Clostridium difficile colitis 12/16/2017   Irritable bowel syndrome without diarrhea 12/16/2017   Cataract cortical, senile 08/03/2017   Chronic colitis 08/03/2017   Medicare annual wellness visit, initial 12/15/2016   Benign essential hypertension 11/04/2016   OA (osteoarthritis) of knee 02/12/2016   Hyperlipidemia, mixed 10/24/2015   Clostridium difficile colitis 01/02/2014   Right bundle branch block 10/18/2013    PCP: Kiara Oneil FALCON, MD  REFERRING PROVIDER: Marchia Drivers, MD  REFERRING DIAG: M17.0 (ICD-10-CM) - Primary osteoarthritis of both knees   RATIONALE FOR EVALUATION AND TREATMENT: Rehabilitation  THERAPY DIAG: Unsteadiness on feet  Muscle weakness (generalized)  Chronic pain of both knees  ONSET DATE: Chronic     FOLLOW-UP APPT SCHEDULED WITH REFERRING PROVIDER: No    SUBJECTIVE:  SUBJECTIVE STATEMENT:    Patient is an 89 year old female presenting with unsteadiness and R/L knee pain.   PERTINENT HISTORY:   Kiara [name] is an 89 year old female presenting to OPPT with a chief concern of balance deficits and bilateral knee pain. She has a history of TIA, falls and lumbar spinal stenosis and R TKA. Patient received corticosteroid injection the L knee. History of balance deficits primarily due to baseline peripheral neuropathy in bilateral feet contributing to difficulty walking. She reports that her balance has worsened within the last month. Patient currently walking with hurry cane in the community. She reports  that she drifts to one side (L > R) while walking. Patient reports that she will have directed radiation towards the L knee performed by her oncologist.   R Hand Dominant.   Imaging: No   PAIN:   Pain Intensity: Present: 0/10, Best: 0/10, Worst: 9/10 Pain location: bilateral knees  Pain quality: intermittent and sharp  Radiating pain: No  Swelling: No  Numbness/Tingling: Yes Focal weakness or buckling: No Aggravating factors: Walking Relieving factors: Sitting   PRECAUTIONS: Fall  WEIGHT BEARING RESTRICTIONS: No  FALLS: Has patient fallen in last 6 months? Yes. Number of falls 1  Living Environment Lives with: lives alone Lives in: House/apartment Stairs: No Has following equipment at home: Vannie - 4 wheeled and hurry cane  Prior level of function: Independent  Occupational demands: Retired  Presenter, Broadcasting: Taking care of her dog, walking   Patient Goals: I would like to be able to walk without assistance    OBJECTIVE:   PATIENT SURVEYS:  LEFS: 48/80 (80/80 = no disability) ABC: 74/100% - Moderate confidence    Cognition Patient is oriented to person, place, and time.  Recent memory is intact.  Accompanied by daughter: Kiara [name]   Gross Musculoskeletal Assessment Tremor: None Bulk: Normal Tone: Normal  GAIT: Distance walked: 63m  Assistive device utilized: hurry cane Level of assistance: Modified independence Comments: R hand hurry cane, reciprocal gait   Posture:  AROM AROM (Normal range in degrees) AROM  Hip Right Left  Flexion (125)    Extension (15)    Abduction (40)    Adduction     Internal Rotation (45)    External Rotation (45)        Knee    Flexion (135)    Extension (0)        Ankle    Dorsiflexion (20)    Plantarflexion (50)    Inversion (35)    Eversion (15    (* = pain; Blank rows = not tested)  LE MMT: MMT (out of 5) Right Left  Hip flexion 4- 4-  Hip extension    Hip abduction    Hip adduction    Hip internal rotation     Hip external rotation    Knee flexion 4- 4-  Knee extension 4 4  Ankle dorsiflexion 5 5  Ankle plantarflexion 5 5  Ankle inversion    Ankle eversion    (* = pain; Blank rows = not tested)  Sensation Grossly intact to light touch bilateral LEs as determined by testing dermatomes L2-S2. Proprioception, and hot/cold testing deferred on this date.  Reflexes R/L Knee Jerk (L3/4): 2+/1+  Ankle Jerk (S1/2): 2+/1+  Muscle Length Not tested  Palpation Location LEFT  RIGHT           Quadriceps 0 0  Medial Hamstrings 0 0  Lateral Hamstrings 0 0  Lateral Hamstring tendon  Medial Hamstring tendon    Quadriceps tendon 0 0  Patella    Patellar Tendon    Tibial Tuberosity    Medial joint line    Lateral joint line    MCL    LCL    Adductor Tubercle    Pes Anserine tendon    Infrapatellar fat pad    Fibular head    Popliteal fossa    (Blank rows = not tested) Graded on 0-4 scale (0 = no pain, 1 = pain, 2 = pain with wincing/grimacing/flinching, 3 = pain with withdrawal, 4 = unwilling to allow palpation), (Blank rows = not tested)  Passive Accessory Motion Deferred  Functional Test:  : 19.77 5TSTS: 15.88s  TODAY'S TREATMENT: DATE: 02/17/24  Precaution per MD 10/31: BP should be under 150/90 and above 100/60 in resting position. Systolic of 180-190 with sx prompt emergency care.   Subjective: Patient reports 7/10 in L knee with movement. Patient concerned that low dose radiation didn't help with knee pain. No further questions or concerns.   Vitals (Seated, Left Brachial):   Blood Pressure: 122/55  HR: 72  O2: 100  Therapeutic Activity:   NuStep L6-2 x 6 min x UE/LE (Seat 8) x > 90 SPM for LE endurance and strength for increased walking capacity; PT manually adjusted resistance throughout bout.    Sit To Stand - with OH reach using 2 Kg Med ball   3 x 10  - Dual Task pt to call out veggie or fruit with specific cone color   Neuromuscular Re-education( with  intent to improve dynamic balance and proprioception during walking):  Activity Description: Hurdle Clearing (Sideways and Forward 2x each dir) Activity Setting:  Focus Number of Pods:  5 Cycles/Sets:  4 Duration (Time or Hit Count):  10 Hits  **Three instances of minor LOB with prolonged SLS on L leg, patient using stepping strategy to maintain balance. Better performance in sidestepping with use of AD  Activity Description: SLS Balance (2 Pods on 1st step, 2 on the floor, box pattern)  Activity Setting:  Focus Number of Pods:  4 Cycles/Sets:  3 Duration (Time or Hit Count):  10 hits  Activity Description: Bending & Squatting, UE deactivation (4 pods on the floor in square) Activity Setting:  Focus Number of Pods:  4 Cycles/Sets:  3 Duration (Time or Hit Count):  10  The Blaze Pod Focus setting was selected to refine precision and concentration, isolating specific muscle groups or movements to enhance overall coordination and targeted muscle engagement.  Tall Marching - SBA for safety  1 x 22m - one instance of prolonged SLS on RLE but able to maintain balance  1 x 82m - 4# AW donned    PATIENT EDUCATION:  Education details: Exercise Technique  Person educated: Patient and Child(ren) Education method: Explanation, Demonstration, and Handouts Education comprehension: verbalized understanding and returned demonstration  HOME EXERCISE PROGRAM:  Access Code: [email or web address] URL: [email or web address] Date: 01/24/2024 Prepared by: Lonni Pall  Exercises - Standing March with Counter Support  - 1 x daily - 3-4 x weekly - 2-3 sets - 10-12 reps - Feet Together Balance at [employer] Eyes Closed  - 1 x daily - 7 x weekly - 3 sets - 30 hold - Sit to Stand with Armchair  - 1 x daily - 3-4 x weekly - 2-3 sets - 8-10 reps  ASSESSMENT:  CLINICAL IMPRESSION: Patient returned to OPPT for treatment of balance deficits and knee pain. Vital Signs stable throughout  session and  patient provided appropriate rest breaks between interventions. PT challenged single leg stance with dual tasking using blazepods. Three instances of minor LOB with side stepping over hurdles but patient able to maintain balance using stepping strategy. Good demonstration with bending task but repetitions limited due to pain in the L knee.  She continues to demonstrate improvements to her dynamic balance without dependency on hurry cane since the start of POC. However, Patient presents with deficits in LE strength and balance in various environments uneven surfaces. This affects her full participation with prolonged walking, recreational activities at the beach and family. Patient will benefit from skilled PT in order to address current deficits and maximize return to PLOF.    OBJECTIVE IMPAIRMENTS: Abnormal gait, decreased activity tolerance, decreased balance, decreased endurance, difficulty walking, decreased ROM, decreased strength, impaired sensation, and pain.   ACTIVITY LIMITATIONS: carrying, lifting, standing, squatting, and stairs  PARTICIPATION LIMITATIONS: shopping, community activity, and yard work  PERSONAL FACTORS: Age, Past/current experiences, Profession, Sex, Time since onset of injury/illness/exacerbation, and 1-2 comorbidities: HTN, Hx of TIA are also affecting patient's functional outcome.   REHAB POTENTIAL: Fair    CLINICAL DECISION MAKING: Evolving/moderate complexity  EVALUATION COMPLEXITY: Moderate   GOALS: Goals reviewed with patient? Yes  SHORT TERM GOALS: Target date: 03/06/2024  Pt will be independent with HEP to improve strength and decrease knee pain to improve pain-free function at home and work. Baseline: 01/24/2024: Initial HEP provided  Goal status: INITIAL   LONG TERM GOALS: Target date: 04/18/2023  Pt will improve ABC by at least 13% in order to demonstrate clinically significant improvement in balance confidence. Baseline: 01/24/2024: ABC score:  1190 / 1600 = 74.4 % Goal status: INITIAL  2.  Pt will decrease worst knee pain by at least 3 points on the NPRS in order to demonstrate clinically significant reduction in knee pain. Baseline: 01/24/2024: 09/10 NPS Goal status: INITIAL  3.  Pt will increase LEFS score by at least 9 points in order demonstrate clinically significant reduction in knee pain/disability.       Baseline: 01/24/2024: 48/80 (80/80 = no disability) Goal status: INITIAL  4.  Pt will increase strength of knee flexion/extension by at least 1/2 MMT grade in order to demonstrate improvement in strength and function  Baseline: 01/24/2024: R/L: 4- knee Flexion, R/L 4/5 Knee Extension Goal status: INITIAL  5.  Pt will increase by at least 0.13 m/s in order to demonstrate clinically significant improvement in community ambulation.  Baseline: 01/24/2024:19.77s without AD  Goal status: INITIAL  6.  Pt will decrease 5TSTS by at least 3 seconds in order to demonstrate clinically significant improvement in LE strength and functional ability.  Baseline: 01/24/2024: 15.88s Goal status: INITIAL  PLAN:  PT FREQUENCY: 1-2x/week  PT DURATION: 12 weeks  PLANNED INTERVENTIONS: 97110-Therapeutic exercises, 97530- Therapeutic activity, 97112- Neuromuscular re-education, 97535- Self Care, [id number]- Manual therapy, [phone]- Gait training, Patient/Family education, Balance training, Stair training, Cryotherapy, and Moist heat  PLAN FOR NEXT SESSION: Investment Banker, Operational, Location Manager, LE strengthening  Lonni Pall PT, DPT Physical Therapist- [place]  02/17/2024, 1:22 PM

## 2024-02-18 NOTE — Radiation Completion Notes (Signed)
 Patient Name: Kiara [name], Kiara [name] MRN: [id number] Date of Birth: [birth year] Referring Physician: ONEIL PINAL, M.D. Date of Service: 2024-02-18 Radiation Oncologist: Marcey Penton, M.D. [facility] [place]                             RADIATION ONCOLOGY END OF TREATMENT NOTE     Diagnosis: M17.12 Unilateral primary osteoarthritis, left knee Intent: Curative     HPI: Patient is a 89 year old female with history of total right knee joint replacement back in 2017.  She has had problems with back pain and left knee pain on and off for several years.  She describes her pain now on ambulation and sometimes up to a 10.  She does take Celebrex for her pain.  She ambulates with the assistance of a cane although she is not using her cane today.  I been asked to evaluate her for possible low-dose radiation therapy for left knee osteoarthritis.      ==========DELIVERED PLANS==========  First Treatment Date: 2024-02-02 Last Treatment Date: 2024-02-16   Plan Name: Ext_L_Knee Site: Knee, Left Technique: Isodose Plan Mode: Photon Dose Per Fraction: 0.5 Gy Prescribed Dose (Delivered / Prescribed): 3 Gy / 3 Gy Prescribed Fxs (Delivered / Prescribed): 6 / 6     ==========ON TREATMENT VISIT DATES========== 2024-02-14     ==========UPCOMING VISITS========== 03/29/2024 [facility] Krista Lonni PARAS, PT  03/27/2024 [facility] Go, Lonni PARAS, PT  03/06/2024 [facility] ONCOLOGY FOLLOW UP 30 Penton Marcey, MD  03/06/2024 [facility] Go, Lonni PARAS, PT  03/02/2024 [facility] Krista Lonni PARAS, PT  02/29/2024 [facility] Krista Lonni PARAS, PT  02/23/2024 [facility] Go, Lonni PARAS, PT  02/21/2024 [facility] Go, Lonni PARAS, PT        ==========APPENDIX - ON  TREATMENT VISIT NOTES==========   See weekly On Treatment Notes in Epic for details in the Media tab (listed as Progress notes on the On Treatment Visit Dates listed above).

## 2024-02-21 ENCOUNTER — Ambulatory Visit

## 2024-02-21 DIAGNOSIS — R2681 Unsteadiness on feet: Secondary | ICD-10-CM | POA: Diagnosis not present

## 2024-02-21 DIAGNOSIS — G8929 Other chronic pain: Secondary | ICD-10-CM

## 2024-02-21 DIAGNOSIS — M6281 Muscle weakness (generalized): Secondary | ICD-10-CM

## 2024-02-21 NOTE — Therapy (Signed)
 OUTPATIENT PHYSICAL THERAPY KNEE/BALANCE TREATMENT  Patient Name: Kiara [name] MRN: [id number] DOB:[birth year], 89 y.o., female Today's Date: 02/21/2024  END OF SESSION:  PT End of Session - 02/21/24 1522     Visit Number 7    Number of Visits 17    Date for Recertification  03/20/24    Authorization Type 10/27 - 12/31  Authorization #[id number] auth 18 visits    Authorization Time Period 10/27- 12/31    Authorization - Visit Number 7    Authorization - Number of Visits 18    Progress Note Due on Visit 10    PT Start Time 1519    PT Stop Time 1600    PT Time Calculation (min) 41 min    Activity Tolerance Patient tolerated treatment well    Behavior During Therapy WFL for tasks assessed/performed              Past Medical History:  Diagnosis Date   Arthritis    osteoarthritis -right knee.   C. difficile colitis    past,no recent issues   Colitis    Diverticulitis    Fall    tripped-10 days ago-scrapped right knee and lateral right brow area- healing   GERD (gastroesophageal reflux disease)    occ.   Seasonal allergies    Past Surgical History:  Procedure Laterality Date   APPENDECTOMY     CATARACT EXTRACTION, BILATERAL     EYE SURGERY     ? right eye retina repair.   HEMORRHOID SURGERY     KNEE ARTHROSCOPY Right 11/26/2014   Procedure: ARTHROSCOPY KNEE, Tear medical horn and anterior horn. Lateral mesicus tear, grade 3 medial;  Surgeon: Lynwood SHAUNNA Hue, MD;  Location: [facility] ORS;  Service: Orthopedics;  Laterality: Right;   MOLE REMOVAL     benign   TONSILLECTOMY     TOTAL KNEE ARTHROPLASTY Right 02/12/2016   Procedure: RIGHT TOTAL KNEE ARTHROPLASTY;  Surgeon: Dempsey Moan, MD;  Location: [facility];  Service: Orthopedics;  Laterality: Right;   Patient Active Problem List   Diagnosis Date Noted   Mild cognitive impairment 02/02/2022   TIA (transient ischemic attack) 08/08/2020   Degenerative lumbar spinal stenosis 06/29/2019   Pulmonary nodules/lesions,  multiple 02/08/2019   Exudative age-related macular degeneration of left eye with active choroidal neovascularization (HCC) 01/20/2019   Idiopathic peripheral neuropathy 01/20/2019   Radial styloid tenosynovitis 03/28/2018   Pain in right knee 02/23/2018   History of Clostridium difficile colitis 12/16/2017   Irritable bowel syndrome without diarrhea 12/16/2017   Cataract cortical, senile 08/03/2017   Chronic colitis 08/03/2017   Medicare annual wellness visit, initial 12/15/2016   Benign essential hypertension 11/04/2016   OA (osteoarthritis) of knee 02/12/2016   Hyperlipidemia, mixed 10/24/2015   Clostridium difficile colitis 01/02/2014   Right bundle branch block 10/18/2013    PCP: Cleotilde Oneil FALCON, MD  REFERRING PROVIDER: Marchia Drivers, MD  REFERRING DIAG: M17.0 (ICD-10-CM) - Primary osteoarthritis of both knees   RATIONALE FOR EVALUATION AND TREATMENT: Rehabilitation  THERAPY DIAG: Unsteadiness on feet  Muscle weakness (generalized)  Chronic pain of both knees  ONSET DATE: Chronic     FOLLOW-UP APPT SCHEDULED WITH REFERRING PROVIDER: No    SUBJECTIVE:  SUBJECTIVE STATEMENT:    Patient is an 89 year old female presenting with unsteadiness and R/L knee pain.   PERTINENT HISTORY:   Kiara [name] is an 89 year old female presenting to OPPT with a chief concern of balance deficits and bilateral knee pain. She has a history of TIA, falls and lumbar spinal stenosis and R TKA. Patient received corticosteroid injection the L knee. History of balance deficits primarily due to baseline peripheral neuropathy in bilateral feet contributing to difficulty walking. She reports that her balance has worsened within the last month. Patient currently walking with hurry cane in the community. She reports  that she drifts to one side (L > R) while walking. Patient reports that she will have directed radiation towards the L knee performed by her oncologist.   R Hand Dominant.   Imaging: No   PAIN:   Pain Intensity: Present: 0/10, Best: 0/10, Worst: 9/10 Pain location: bilateral knees  Pain quality: intermittent and sharp  Radiating pain: No  Swelling: No  Numbness/Tingling: Yes Focal weakness or buckling: No Aggravating factors: Walking Relieving factors: Sitting   PRECAUTIONS: Fall  WEIGHT BEARING RESTRICTIONS: No  FALLS: Has patient fallen in last 6 months? Yes. Number of falls 1  Living Environment Lives with: lives alone Lives in: House/apartment Stairs: No Has following equipment at home: Vannie - 4 wheeled and hurry cane  Prior level of function: Independent  Occupational demands: Retired  Presenter, Broadcasting: Taking care of her dog, walking   Patient Goals: I would like to be able to walk without assistance    OBJECTIVE:   PATIENT SURVEYS:  LEFS: 48/80 (80/80 = no disability) ABC: 74/100% - Moderate confidence    Cognition Patient is oriented to person, place, and time.  Recent memory is intact.  Accompanied by daughter: Kiara [name]   Gross Musculoskeletal Assessment Tremor: None Bulk: Normal Tone: Normal  GAIT: Distance walked: 6m  Assistive device utilized: hurry cane Level of assistance: Modified independence Comments: R hand hurry cane, reciprocal gait   Posture:  AROM AROM (Normal range in degrees) AROM  Hip Right Left  Flexion (125)    Extension (15)    Abduction (40)    Adduction     Internal Rotation (45)    External Rotation (45)        Knee    Flexion (135)    Extension (0)        Ankle    Dorsiflexion (20)    Plantarflexion (50)    Inversion (35)    Eversion (15    (* = pain; Blank rows = not tested)  LE MMT: MMT (out of 5) Right Left  Hip flexion 4- 4-  Hip extension    Hip abduction    Hip adduction    Hip internal rotation     Hip external rotation    Knee flexion 4- 4-  Knee extension 4 4  Ankle dorsiflexion 5 5  Ankle plantarflexion 5 5  Ankle inversion    Ankle eversion    (* = pain; Blank rows = not tested)  Sensation Grossly intact to light touch bilateral LEs as determined by testing dermatomes L2-S2. Proprioception, and hot/cold testing deferred on this date.  Reflexes R/L Knee Jerk (L3/4): 2+/1+  Ankle Jerk (S1/2): 2+/1+  Muscle Length Not tested  Palpation Location LEFT  RIGHT           Quadriceps 0 0  Medial Hamstrings 0 0  Lateral Hamstrings 0 0  Lateral Hamstring tendon  Medial Hamstring tendon    Quadriceps tendon 0 0  Patella    Patellar Tendon    Tibial Tuberosity    Medial joint line    Lateral joint line    MCL    LCL    Adductor Tubercle    Pes Anserine tendon    Infrapatellar fat pad    Fibular head    Popliteal fossa    (Blank rows = not tested) Graded on 0-4 scale (0 = no pain, 1 = pain, 2 = pain with wincing/grimacing/flinching, 3 = pain with withdrawal, 4 = unwilling to allow palpation), (Blank rows = not tested)  Passive Accessory Motion Deferred  Functional Test:  : 19.77 5TSTS: 15.88s  TODAY'S TREATMENT: DATE: 02/21/24  Precaution per MD 10/31: BP should be under 150/90 and above 100/60 in resting position. Systolic of 180-190 with sx prompt emergency care.   Subjective: Patient reports 0/10 in the L knee at resting. 8/10 in the L knee with walking/weight bearing.  No further questions or concerns.   Vitals (Seated, Left Brachial):   Blood Pressure: 113/60  HR: 72  O2: 100  Therapeutic Activity:   NuStep L6-2 x 6 min x UE/LE (Seat 8) x > 90 SPM for LE endurance and strength for increased walking capacity; PT manually adjusted resistance throughout bout.   Sit to Stand with Med Ball Throw   1 x 10 - 1 Kg med ballBall   1 x 10 - 3 Kg  med [address] and [employer] to wall  2 x 10 - 1 Kg med ball  Sled Push (45#)   4 x 10'    Therapeutic Exercise:  Data Processing Manager Extension     3 x 10 - 10#    Hamstring Curl    3 x 10 - 20#   Standing Hip Abduction against resistance   R/L: 2 x 10 - YTB around lower leg    PATIENT EDUCATION:  Education details: Exercise Technique  Person educated: Patient and Child(ren) Education method: Explanation, Demonstration, and Handouts Education comprehension: verbalized understanding and returned demonstration  HOME EXERCISE PROGRAM:  Access Code: [email or web address] URL: [email or web address] Date: 01/24/2024 Prepared by: Lonni Pall  Exercises - Standing March with Counter Support  - 1 x daily - 3-4 x weekly - 2-3 sets - 10-12 reps - Feet Together Balance at [employer] Eyes Closed  - 1 x daily - 7 x weekly - 3 sets - 30 hold - Sit to Stand with Armchair  - 1 x daily - 3-4 x weekly - 2-3 sets - 8-10 reps  ASSESSMENT:  CLINICAL IMPRESSION: Patient returned to OPPT for treatment of balance deficits and knee pain. Vital Signs stable throughout session and patient provided appropriate rest breaks between interventions. PT focused on increasing LE strength in order to improve knee pain. She walked throughout the clinic without use of AD, she maintained her balance without requiring physical assistance.  Patient presents with deficits in LE strength and balance in various environments uneven surfaces. This affects her full participation with prolonged walking, recreational activities at the beach and family. Patient will benefit from skilled PT in order to address current deficits and maximize return to PLOF.   OBJECTIVE IMPAIRMENTS: Abnormal gait, decreased activity tolerance, decreased balance, decreased endurance, difficulty walking, decreased ROM, decreased strength, impaired sensation, and pain.   ACTIVITY LIMITATIONS: carrying, lifting, standing, squatting, and stairs  PARTICIPATION LIMITATIONS: shopping, community activity, and yard work  PERSONAL  FACTORS: Age, Past/current experiences, Profession, Sex, Time since onset of injury/illness/exacerbation, and 1-2 comorbidities: HTN, Hx of TIA are also affecting patient's functional outcome.   REHAB POTENTIAL: Fair    CLINICAL DECISION MAKING: Evolving/moderate complexity  EVALUATION COMPLEXITY: Moderate   GOALS: Goals reviewed with patient? Yes  SHORT TERM GOALS: Target date: 03/06/2024  Pt will be independent with HEP to improve strength and decrease knee pain to improve pain-free function at home and work. Baseline: 01/24/2024: Initial HEP provided  Goal status: INITIAL   LONG TERM GOALS: Target date: 04/18/2023  Pt will improve ABC by at least 13% in order to demonstrate clinically significant improvement in balance confidence. Baseline: 01/24/2024: ABC score: 1190 / 1600 = 74.4 % Goal status: INITIAL  2.  Pt will decrease worst knee pain by at least 3 points on the NPRS in order to demonstrate clinically significant reduction in knee pain. Baseline: 01/24/2024: 09/10 NPS Goal status: INITIAL  3.  Pt will increase LEFS score by at least 9 points in order demonstrate clinically significant reduction in knee pain/disability.       Baseline: 01/24/2024: 48/80 (80/80 = no disability) Goal status: INITIAL  4.  Pt will increase strength of knee flexion/extension by at least 1/2 MMT grade in order to demonstrate improvement in strength and function  Baseline: 01/24/2024: R/L: 4- knee Flexion, R/L 4/5 Knee Extension Goal status: INITIAL  5.  Pt will increase by at least 0.13 m/s in order to demonstrate clinically significant improvement in community ambulation.  Baseline: 01/24/2024:19.77s without AD  Goal status: INITIAL  6.  Pt will decrease 5TSTS by at least 3 seconds in order to demonstrate clinically significant improvement in LE strength and functional ability.  Baseline: 01/24/2024: 15.88s Goal status: INITIAL  PLAN:  PT FREQUENCY: 1-2x/week  PT DURATION:  12 weeks  PLANNED INTERVENTIONS: 97110-Therapeutic exercises, 97530- Therapeutic activity, 97112- Neuromuscular re-education, 97535- Self Care, [id number]- Manual therapy, [phone]- Gait training, Patient/Family education, Balance training, Stair training, Cryotherapy, and Moist heat  PLAN FOR NEXT SESSION: Investment Banker, Operational, Location Manager, LE strengthening  Lonni Pall PT, DPT Physical Therapist- [place]  02/21/2024, 3:24 PM

## 2024-02-23 ENCOUNTER — Ambulatory Visit

## 2024-02-23 ENCOUNTER — Telehealth: Payer: Self-pay

## 2024-02-23 NOTE — Telephone Encounter (Signed)
 Called patient about missed appt. Patient reported that she mixed up the appt time. Advised patient of following appt and attendance policy. Patient understanding and without further questions or concerns.   Lonni Pall PT, DPT Physical Therapist- [place]

## 2024-02-28 ENCOUNTER — Ambulatory Visit

## 2024-02-28 DIAGNOSIS — R2681 Unsteadiness on feet: Secondary | ICD-10-CM | POA: Insufficient documentation

## 2024-02-28 DIAGNOSIS — M25562 Pain in left knee: Secondary | ICD-10-CM | POA: Insufficient documentation

## 2024-02-28 DIAGNOSIS — G8929 Other chronic pain: Secondary | ICD-10-CM | POA: Insufficient documentation

## 2024-02-28 DIAGNOSIS — M6281 Muscle weakness (generalized): Secondary | ICD-10-CM | POA: Insufficient documentation

## 2024-02-28 DIAGNOSIS — M25561 Pain in right knee: Secondary | ICD-10-CM | POA: Insufficient documentation

## 2024-02-28 NOTE — Therapy (Signed)
 OUTPATIENT PHYSICAL THERAPY KNEE/BALANCE TREATMENT  Patient Name: Kiara [name] MRN: [id number] DOB:Sep 03, 1934, 89 y.o., female Today's Date: 02/28/2024  END OF SESSION:  PT End of Session - 02/28/24 0950     Visit Number 8    Number of Visits 17    Date for Recertification  03/20/24    Authorization Type 10/27 - 12/31  Authorization #[id number] auth 18 visits    Authorization Time Period 10/27- 12/31    Authorization - Visit Number 8    Authorization - Number of Visits 18    Progress Note Due on Visit 10    PT Start Time 0950    PT Stop Time 1030    PT Time Calculation (min) 40 min    Activity Tolerance Patient tolerated treatment well    Behavior During Therapy [facility] for tasks assessed/performed              Past Medical History:  Diagnosis Date   Arthritis    osteoarthritis -right knee.   C. difficile colitis    past,no recent issues   Colitis    Diverticulitis    Fall    tripped-10 days ago-scrapped right knee and lateral right brow area- healing   GERD (gastroesophageal reflux disease)    occ.   Seasonal allergies    Past Surgical History:  Procedure Laterality Date   APPENDECTOMY     CATARACT EXTRACTION, BILATERAL     EYE SURGERY     ? right eye retina repair.   HEMORRHOID SURGERY     KNEE ARTHROSCOPY Right 11/26/2014   Procedure: ARTHROSCOPY KNEE, Tear medical horn and anterior horn. Lateral mesicus tear, grade 3 medial;  Surgeon: Kiara SHAUNNA Hue, MD;  Location: [facility] ORS;  Service: Orthopedics;  Laterality: Right;   MOLE REMOVAL     benign   TONSILLECTOMY     TOTAL KNEE ARTHROPLASTY Right 02/12/2016   Procedure: RIGHT TOTAL KNEE ARTHROPLASTY;  Surgeon: Kiara Moan, MD;  Location: [facility];  Service: Orthopedics;  Laterality: Right;   Patient Active Problem List   Diagnosis Date Noted   Mild cognitive impairment 02/02/2022   TIA (transient ischemic attack) 08/08/2020   Degenerative lumbar spinal stenosis 06/29/2019   Pulmonary nodules/lesions,  multiple 02/08/2019   Exudative age-related macular degeneration of left eye with active choroidal neovascularization (HCC) 01/20/2019   Idiopathic peripheral neuropathy 01/20/2019   Radial styloid tenosynovitis 03/28/2018   Pain in right knee 02/23/2018   History of Clostridium difficile colitis 12/16/2017   Irritable bowel syndrome without diarrhea 12/16/2017   Cataract cortical, senile 08/03/2017   Chronic colitis 08/03/2017   Medicare annual wellness visit, initial 12/15/2016   Benign essential hypertension 11/04/2016   OA (osteoarthritis) of knee 02/12/2016   Hyperlipidemia, mixed 10/24/2015   Clostridium difficile colitis 01/02/2014   Right bundle branch block 10/18/2013    PCP: Kiara Oneil FALCON, MD  REFERRING PROVIDER: Marchia Drivers, MD  REFERRING DIAG: M17.0 (ICD-10-CM) - Primary osteoarthritis of both knees   RATIONALE FOR EVALUATION AND TREATMENT: Rehabilitation  THERAPY DIAG: Unsteadiness on feet  Muscle weakness (generalized)  Chronic pain of both knees  ONSET DATE: Chronic     FOLLOW-UP APPT SCHEDULED WITH REFERRING PROVIDER: No    SUBJECTIVE:  SUBJECTIVE STATEMENT:    Patient is an 89 year old female presenting with unsteadiness and R/L knee pain.   PERTINENT HISTORY:   Kiara [name] is an 89 year old female presenting to OPPT with a chief concern of balance deficits and bilateral knee pain. She has a history of TIA, falls and lumbar spinal stenosis and R TKA. Patient received corticosteroid injection the L knee. History of balance deficits primarily due to baseline peripheral neuropathy in bilateral feet contributing to difficulty walking. She reports that her balance has worsened within the last month. Patient currently walking with hurry cane in the community. She reports  that she drifts to one side (L > R) while walking. Patient reports that she will have directed radiation towards the L knee performed by her oncologist.   R Hand Dominant.   Imaging: No   PAIN:   Pain Intensity: Present: 0/10, Best: 0/10, Worst: 9/10 Pain location: bilateral knees  Pain quality: intermittent and sharp  Radiating pain: No  Swelling: No  Numbness/Tingling: Yes Focal weakness or buckling: No Aggravating factors: Walking Relieving factors: Sitting   PRECAUTIONS: Fall  WEIGHT BEARING RESTRICTIONS: No  FALLS: Has patient fallen in last 6 months? Yes. Number of falls 1  Living Environment Lives with: lives alone Lives in: House/apartment Stairs: No Has following equipment at home: Vannie - 4 wheeled and hurry cane  Prior level of function: Independent  Occupational demands: Retired  Presenter, Broadcasting: Taking care of her dog, walking   Patient Goals: I would like to be able to walk without assistance    OBJECTIVE:   PATIENT SURVEYS:  LEFS: 48/80 (80/80 = no disability) ABC: 74/100% - Moderate confidence    Cognition Patient is oriented to person, place, and time.  Recent memory is intact.  Accompanied by daughter: Kiara [name]   Gross Musculoskeletal Assessment Tremor: None Bulk: Normal Tone: Normal  GAIT: Distance walked: 42m  Assistive device utilized: hurry cane Level of assistance: Modified independence Comments: R hand hurry cane, reciprocal gait   Posture:  AROM AROM (Normal range in degrees) AROM  Hip Right Left  Flexion (125)    Extension (15)    Abduction (40)    Adduction     Internal Rotation (45)    External Rotation (45)        Knee    Flexion (135)    Extension (0)        Ankle    Dorsiflexion (20)    Plantarflexion (50)    Inversion (35)    Eversion (15    (* = pain; Blank rows = not tested)  LE MMT: MMT (out of 5) Right Left  Hip flexion 4- 4-  Hip extension    Hip abduction    Hip adduction    Hip internal rotation     Hip external rotation    Knee flexion 4- 4-  Knee extension 4 4  Ankle dorsiflexion 5 5  Ankle plantarflexion 5 5  Ankle inversion    Ankle eversion    (* = pain; Blank rows = not tested)  Sensation Grossly intact to light touch bilateral LEs as determined by testing dermatomes L2-S2. Proprioception, and hot/cold testing deferred on this date.  Reflexes R/L Knee Jerk (L3/4): 2+/1+  Ankle Jerk (S1/2): 2+/1+  Muscle Length Not tested  Palpation Location LEFT  RIGHT           Quadriceps 0 0  Medial Hamstrings 0 0  Lateral Hamstrings 0 0  Lateral Hamstring tendon  Medial Hamstring tendon    Quadriceps tendon 0 0  Patella    Patellar Tendon    Tibial Tuberosity    Medial joint line    Lateral joint line    MCL    LCL    Adductor Tubercle    Pes Anserine tendon    Infrapatellar fat pad    Fibular head    Popliteal fossa    (Blank rows = not tested) Graded on 0-4 scale (0 = no pain, 1 = pain, 2 = pain with wincing/grimacing/flinching, 3 = pain with withdrawal, 4 = unwilling to allow palpation), (Blank rows = not tested)  Passive Accessory Motion Deferred  Functional Test:  : 19.77 5TSTS: 15.88s  TODAY'S TREATMENT: DATE: 02/28/24  Precaution per MD 10/31: BP should be under 150/90 and above 100/60 in resting position. Systolic of 180-190 with sx prompt emergency care.   Subjective: Patient reports 0/10 in the L knee in resting position. Denies Dizziness, lightheadedness, chest pain. However patient reports that she lost her best friend recently and it has been hard on her mindset.   No further questions or concerns.   Pre Exercise Vitals (Seated, Left Brachial):   Blood Pressure: 162/62  HR: 70  O2: 100  After NuStep:  Vitals (Seated, Left Brachial):   Blood Pressure: 139/64  HR: 77  O2: 100  Post Exercise: Vitals (Seated, Left Brachial):   Blood Pressure: 161/62  HR: 78  O2: 100    Therapeutic Activity:  NuStep L2-1 x 5 min x UE/LE  (Seat 8) x > 70 SPM for LE endurance and strength for increased walking capacity; PT manually adjusted resistance throughout bout.   Forward Step - SUE Support  R/L: 2 x 10 reps  TRX Squat   2 x 10    Hurdle Clearance for Curb Navigating   5 laps x 5 small hurdles (1' apart)  - Single instance of trailing LE hitting hurdle  Therapeutic Exercise:  Omega Cable Machine  Knee Extension    2 x 10 - 10# 2 x 10 - 15#    Hamstring Curl    3 x 10 - 20#   Standing Heel Raises - Bilateral  3 x 15 - No UE support, last two on balance pad    PATIENT EDUCATION:  Education details: Exercise Technique  Person educated: Patient and Child(ren) Education method: Explanation, Demonstration, and Handouts Education comprehension: verbalized understanding and returned demonstration  HOME EXERCISE PROGRAM:  Access Code: [email or web address] URL: [email or web address] Date: 01/24/2024 Prepared by: Lonni Pall  Exercises - Standing March with Counter Support  - 1 x daily - 3-4 x weekly - 2-3 sets - 10-12 reps - Feet Together Balance at [employer] Eyes Closed  - 1 x daily - 7 x weekly - 3 sets - 30 hold - Sit to Stand with Armchair  - 1 x daily - 3-4 x weekly - 2-3 sets - 8-10 reps  ASSESSMENT:  CLINICAL IMPRESSION: Patient returned to OPPT for treatment of balance deficits and knee pain. Vital Signs with higher blood pressure readings that normal (see above) and patient provided appropriate rest breaks between interventions. Patient endorsed thathigh Session with main focus on muscular strengthening around knee and hip joint. Good ability to maintain balance with hurdle clearance activity. Patient presents with deficits in LE strength and balance in various environments uneven surfaces. This affects her full participation with prolonged walking, recreational activities at the beach and family. Patient will benefit from skilled PT in order to  address current deficits and maximize return to  PLOF.   OBJECTIVE IMPAIRMENTS: Abnormal gait, decreased activity tolerance, decreased balance, decreased endurance, difficulty walking, decreased ROM, decreased strength, impaired sensation, and pain.   ACTIVITY LIMITATIONS: carrying, lifting, standing, squatting, and stairs  PARTICIPATION LIMITATIONS: shopping, community activity, and yard work  PERSONAL FACTORS: Age, Past/current experiences, Profession, Sex, Time since onset of injury/illness/exacerbation, and 1-2 comorbidities: HTN, Hx of TIA are also affecting patient's functional outcome.   REHAB POTENTIAL: Fair    CLINICAL DECISION MAKING: Evolving/moderate complexity  EVALUATION COMPLEXITY: Moderate   GOALS: Goals reviewed with patient? Yes  SHORT TERM GOALS: Target date: 03/06/2024  Pt will be independent with HEP to improve strength and decrease knee pain to improve pain-free function at home and work. Baseline: 01/24/2024: Initial HEP provided  Goal status: INITIAL   LONG TERM GOALS: Target date: 04/18/2023  Pt will improve ABC by at least 13% in order to demonstrate clinically significant improvement in balance confidence. Baseline: 01/24/2024: ABC score: 1190 / 1600 = 74.4 % Goal status: INITIAL  2.  Pt will decrease worst knee pain by at least 3 points on the NPRS in order to demonstrate clinically significant reduction in knee pain. Baseline: 01/24/2024: 09/10 NPS; 02/28/2024: 10/10 NPS  Goal status: INITIAL  3.  Pt will increase LEFS score by at least 9 points in order demonstrate clinically significant reduction in knee pain/disability.       Baseline: 01/24/2024: 48/80 (80/80 = no disability) Goal status: INITIAL  4.  Pt will increase strength of knee flexion/extension by at least 1/2 MMT grade in order to demonstrate improvement in strength and function  Baseline: 01/24/2024: R/L: 4- knee Flexion, R/L 4/5 Knee Extension Goal status: INITIAL  5.  Pt will increase by at least 0.13 m/s in order to  demonstrate clinically significant improvement in community ambulation.  Baseline: 01/24/2024: 19.77s without AD  Goal status: INITIAL  6.  Pt will decrease 5TSTS by at least 3 seconds in order to demonstrate clinically significant improvement in LE strength and functional ability.  Baseline: 01/24/2024: 15.88s Goal status: INITIAL  PLAN:  PT FREQUENCY: 1-2x/week  PT DURATION: 12 weeks  PLANNED INTERVENTIONS: 97110-Therapeutic exercises, 97530- Therapeutic activity, 97112- Neuromuscular re-education, 97535- Self Care, 02859- Manual therapy, [phone]- Gait training, Patient/Family education, Balance training, Stair training, Cryotherapy, and Moist heat  PLAN FOR NEXT SESSION: Progressing Gait Training with AD, Balance Training, LE strengthening  Lonni Pall PT, DPT Physical Therapist- [place]  02/28/2024, 9:52 AM

## 2024-02-29 ENCOUNTER — Ambulatory Visit

## 2024-03-02 ENCOUNTER — Ambulatory Visit

## 2024-03-02 DIAGNOSIS — M6281 Muscle weakness (generalized): Secondary | ICD-10-CM

## 2024-03-02 DIAGNOSIS — R2681 Unsteadiness on feet: Secondary | ICD-10-CM | POA: Diagnosis not present

## 2024-03-02 DIAGNOSIS — G8929 Other chronic pain: Secondary | ICD-10-CM

## 2024-03-02 NOTE — Therapy (Signed)
 OUTPATIENT PHYSICAL THERAPY KNEE/BALANCE TREATMENT  Patient Name: Kiara [name] MRN: [id number] DOB:Jun 03, 1934, 89 y.o., female Today's Date: 03/02/2024  END OF SESSION:  PT End of Session - 03/02/24 1434     Visit Number 9    Number of Visits 17    Date for Recertification  03/20/24    Authorization Type 10/27 - 12/31  Authorization #[id number] auth 18 visits    Authorization Time Period 10/27- 12/31    Authorization - Number of Visits 18    Progress Note Due on Visit 10    PT Start Time 1435    PT Stop Time 1515    PT Time Calculation (min) 40 min    Activity Tolerance Patient tolerated treatment well    Behavior During Therapy [facility] for tasks assessed/performed              Past Medical History:  Diagnosis Date   Arthritis    osteoarthritis -right knee.   C. difficile colitis    past,no recent issues   Colitis    Diverticulitis    Fall    tripped-10 days ago-scrapped right knee and lateral right brow area- healing   GERD (gastroesophageal reflux disease)    occ.   Seasonal allergies    Past Surgical History:  Procedure Laterality Date   APPENDECTOMY     CATARACT EXTRACTION, BILATERAL     EYE SURGERY     ? right eye retina repair.   HEMORRHOID SURGERY     KNEE ARTHROSCOPY Right 11/26/2014   Procedure: ARTHROSCOPY KNEE, Tear medical horn and anterior horn. Lateral mesicus tear, grade 3 medial;  Surgeon: Kiara SHAUNNA Hue, MD;  Location: [facility] ORS;  Service: Orthopedics;  Laterality: Right;   MOLE REMOVAL     benign   TONSILLECTOMY     TOTAL KNEE ARTHROPLASTY Right 02/12/2016   Procedure: RIGHT TOTAL KNEE ARTHROPLASTY;  Surgeon: Kiara Moan, MD;  Location: [facility];  Service: Orthopedics;  Laterality: Right;   Patient Active Problem List   Diagnosis Date Noted   Mild cognitive impairment 02/02/2022   TIA (transient ischemic attack) 08/08/2020   Degenerative lumbar spinal stenosis 06/29/2019   Pulmonary nodules/lesions, multiple 02/08/2019   Exudative  age-related macular degeneration of left eye with active choroidal neovascularization (HCC) 01/20/2019   Idiopathic peripheral neuropathy 01/20/2019   Radial styloid tenosynovitis 03/28/2018   Pain in right knee 02/23/2018   History of Clostridium difficile colitis 12/16/2017   Irritable bowel syndrome without diarrhea 12/16/2017   Cataract cortical, senile 08/03/2017   Chronic colitis 08/03/2017   Medicare annual wellness visit, initial 12/15/2016   Benign essential hypertension 11/04/2016   OA (osteoarthritis) of knee 02/12/2016   Hyperlipidemia, mixed 10/24/2015   Clostridium difficile colitis 01/02/2014   Right bundle branch block 10/18/2013    PCP: Kiara Oneil FALCON, MD  REFERRING PROVIDER: Marchia Drivers, MD  REFERRING DIAG: M17.0 (ICD-10-CM) - Primary osteoarthritis of both knees   RATIONALE FOR EVALUATION AND TREATMENT: Rehabilitation  THERAPY DIAG: Unsteadiness on feet  Muscle weakness (generalized)  Chronic pain of both knees  ONSET DATE: Chronic     FOLLOW-UP APPT SCHEDULED WITH REFERRING PROVIDER: No    SUBJECTIVE:  SUBJECTIVE STATEMENT:    Patient is an 89 year old female presenting with unsteadiness and R/L knee pain.   PERTINENT HISTORY:   Kiara [name] is an 89 year old female presenting to OPPT with a chief concern of balance deficits and bilateral knee pain. She has a history of TIA, falls and lumbar spinal stenosis and R TKA. Patient received corticosteroid injection the L knee. History of balance deficits primarily due to baseline peripheral neuropathy in bilateral feet contributing to difficulty walking. She reports that her balance has worsened within the last month. Patient currently walking with hurry cane in the community. She reports that she drifts to one side (L >  R) while walking. Patient reports that she will have directed radiation towards the L knee performed by her oncologist.   R Hand Dominant.   Imaging: No   PAIN:   Pain Intensity: Present: 0/10, Best: 0/10, Worst: 9/10 Pain location: bilateral knees  Pain quality: intermittent and sharp  Radiating pain: No  Swelling: No  Numbness/Tingling: Yes Focal weakness or buckling: No Aggravating factors: Walking Relieving factors: Sitting   PRECAUTIONS: Fall  WEIGHT BEARING RESTRICTIONS: No  FALLS: Has patient fallen in last 6 months? Yes. Number of falls 1  Living Environment Lives with: lives alone Lives in: House/apartment Stairs: No Has following equipment at home: Vannie - 4 wheeled and hurry cane  Prior level of function: Independent  Occupational demands: Retired  Presenter, Broadcasting: Taking care of her dog, walking   Patient Goals: I would like to be able to walk without assistance    OBJECTIVE:   PATIENT SURVEYS:  LEFS: 48/80 (80/80 = no disability) ABC: 74/100% - Moderate confidence    Cognition Patient is oriented to person, place, and time.  Recent memory is intact.  Accompanied by daughter: Kiara [name]   Gross Musculoskeletal Assessment Tremor: None Bulk: Normal Tone: Normal  GAIT: Distance walked: 58m  Assistive device utilized: hurry cane Level of assistance: Modified independence Comments: R hand hurry cane, reciprocal gait   Posture:  AROM AROM (Normal range in degrees) AROM  Hip Right Left  Flexion (125)    Extension (15)    Abduction (40)    Adduction     Internal Rotation (45)    External Rotation (45)        Knee    Flexion (135)    Extension (0)        Ankle    Dorsiflexion (20)    Plantarflexion (50)    Inversion (35)    Eversion (15    (* = pain; Blank rows = not tested)  LE MMT: MMT (out of 5) Right Left  Hip flexion 4- 4-  Hip extension    Hip abduction    Hip adduction    Hip internal rotation    Hip external rotation     Knee flexion 4- 4-  Knee extension 4 4  Ankle dorsiflexion 5 5  Ankle plantarflexion 5 5  Ankle inversion    Ankle eversion    (* = pain; Blank rows = not tested)  Sensation Grossly intact to light touch bilateral LEs as determined by testing dermatomes L2-S2. Proprioception, and hot/cold testing deferred on this date.  Reflexes R/L Knee Jerk (L3/4): 2+/1+  Ankle Jerk (S1/2): 2+/1+  Muscle Length Not tested  Palpation Location LEFT  RIGHT           Quadriceps 0 0  Medial Hamstrings 0 0  Lateral Hamstrings 0 0  Lateral Hamstring tendon  Medial Hamstring tendon    Quadriceps tendon 0 0  Patella    Patellar Tendon    Tibial Tuberosity    Medial joint line    Lateral joint line    MCL    LCL    Adductor Tubercle    Pes Anserine tendon    Infrapatellar fat pad    Fibular head    Popliteal fossa    (Blank rows = not tested) Graded on 0-4 scale (0 = no pain, 1 = pain, 2 = pain with wincing/grimacing/flinching, 3 = pain with withdrawal, 4 = unwilling to allow palpation), (Blank rows = not tested)  Passive Accessory Motion Deferred  Functional Test:  : 19.77 5TSTS: 15.88s  TODAY'S TREATMENT: DATE: 03/02/24  Precaution per MD 10/31: BP should be under 150/90 and above 100/60 in resting position. Systolic of 180-190 with sx prompt emergency care.   Subjective: Patient reports 0/10 in the L knee in resting position. She reports 1-2/10 NPS in the knee with walking. Denies Dizziness, lightheadedness, chest pain. However patient reports that she lost her best friend recently and it has been hard on her mindset.   No further questions or concerns.   Pre Exercise Vitals (Seated, Left Brachial):   Blood Pressure: 123/51  HR: 75  O2: 100   Therapeutic Activity:  NuStep L2-1 x 6 min x UE/LE (Seat 8) x > 70 SPM for LE endurance and strength for increased walking capacity; PT manually adjusted resistance throughout bout.   Sit to Stand from elevated Mat table  (seated rest break in b/t sets)  1 x 10   2 x 10 - with 3 Kg MB   1 x 10 - with 2 Kg MB throw  TRX Squat   3 x 10   - VC for tempo to slow down    Resisted Walking with Tidal Tank  3 x 4m - good ability to maintain balance with external perturbation  Hurdle Clearance   Forward Dir - 5 laps x 5 small hurdles - 3 instances of tripping with trailing foot   - Hand hover on cubicle       PATIENT EDUCATION:  Education details: Exercise Technique  Person educated: Patient Education method: Explanation, Demonstration, and Handouts Education comprehension: verbalized understanding and returned demonstration  HOME EXERCISE PROGRAM:  Access Code: [email or web address] URL: [email or web address] Date: 01/24/2024 Prepared by: Lonni Pall  Exercises - Standing March with Counter Support  - 1 x daily - 3-4 x weekly - 2-3 sets - 10-12 reps - Feet Together Balance at [employer] Eyes Closed  - 1 x daily - 7 x weekly - 3 sets - 30 hold - Sit to Stand with Armchair  - 1 x daily - 3-4 x weekly - 2-3 sets - 8-10 reps  ASSESSMENT:  CLINICAL IMPRESSION: Patient returned to OPPT for treatment of balance deficits and knee pain. Vital Signs with higher blood pressure readings that normal (see above) and patient provided appropriate rest breaks between interventions. PT tested dynamic balance with various functional activities. Good ability to maintain balance while walking with constant external perturbation. A few instances of patient with tripping instance against small hurdles with trailing leg for curb navigation. She is able navigate curb clearance with use of SPC. Patient presents with deficits in LE strength and balance in various environments uneven surfaces. This affects her full participation with prolonged walking, recreational activities at the beach and family. Patient will benefit from skilled PT in order to address current deficits  and maximize return to PLOF.   OBJECTIVE  IMPAIRMENTS: Abnormal gait, decreased activity tolerance, decreased balance, decreased endurance, difficulty walking, decreased ROM, decreased strength, impaired sensation, and pain.   ACTIVITY LIMITATIONS: carrying, lifting, standing, squatting, and stairs  PARTICIPATION LIMITATIONS: shopping, community activity, and yard work  PERSONAL FACTORS: Age, Past/current experiences, Profession, Sex, Time since onset of injury/illness/exacerbation, and 1-2 comorbidities: HTN, Hx of TIA are also affecting patient's functional outcome.   REHAB POTENTIAL: Fair    CLINICAL DECISION MAKING: Evolving/moderate complexity  EVALUATION COMPLEXITY: Moderate   GOALS: Goals reviewed with patient? Yes  SHORT TERM GOALS: Target date: 03/06/2024  Pt will be independent with HEP to improve strength and decrease knee pain to improve pain-free function at home and work. Baseline: 01/24/2024: Initial HEP provided  Goal status: INITIAL   LONG TERM GOALS: Target date: 04/18/2023  Pt will improve ABC by at least 13% in order to demonstrate clinically significant improvement in balance confidence. Baseline: 01/24/2024: ABC score: 1190 / 1600 = 74.4 % Goal status: INITIAL  2.  Pt will decrease worst knee pain by at least 3 points on the NPRS in order to demonstrate clinically significant reduction in knee pain. Baseline: 01/24/2024: 09/10 NPS; 02/28/2024: 10/10 NPS  Goal status: Progressing   3.  Pt will increase LEFS score by at least 9 points in order demonstrate clinically significant reduction in knee pain/disability.       Baseline: 01/24/2024: 48/80 (80/80 = no disability) Goal status: INITIAL  4.  Pt will increase strength of knee flexion/extension by at least 1/2 MMT grade in order to demonstrate improvement in strength and function  Baseline: 01/24/2024: R/L: 4- knee Flexion, R/L 4/5 Knee Extension Goal status: INITIAL  5.  Pt will increase by at least 0.13 m/s in order to demonstrate  clinically significant improvement in community ambulation.  Baseline: 01/24/2024: 19.77s without AD; 03/02/2024: .87 m/s without AD  Goal status: Progressing  6.  Pt will decrease 5TSTS by at least 3 seconds in order to demonstrate clinically significant improvement in LE strength and functional ability.  Baseline: 01/24/2024: 15.88s Goal status: INITIAL  PLAN:  PT FREQUENCY: 1-2x/week  PT DURATION: 12 weeks  PLANNED INTERVENTIONS: 97110-Therapeutic exercises, 97530- Therapeutic activity, 97112- Neuromuscular re-education, 97535- Self Care, [id number]- Manual therapy, [phone]- Gait training, Patient/Family education, Balance training, Stair training, Cryotherapy, and Moist heat  PLAN FOR NEXT SESSION: Progressing Gait Training with AD, Balance Training, LE strengthening  Lonni Pall PT, DPT Physical Therapist- [place]  03/02/2024, 2:38 PM

## 2024-03-06 ENCOUNTER — Ambulatory Visit

## 2024-03-06 ENCOUNTER — Ambulatory Visit
Admission: RE | Admit: 2024-03-06 | Discharge: 2024-03-06 | Disposition: A | Source: Ambulatory Visit | Attending: Radiation Oncology | Admitting: Radiation Oncology

## 2024-03-06 ENCOUNTER — Encounter: Payer: Self-pay | Admitting: Radiation Oncology

## 2024-03-06 VITALS — BP 169/79 | HR 67 | Temp 98.3°F | Resp 16 | Wt 131.0 lb

## 2024-03-06 DIAGNOSIS — M1712 Unilateral primary osteoarthritis, left knee: Secondary | ICD-10-CM

## 2024-03-06 DIAGNOSIS — R2681 Unsteadiness on feet: Secondary | ICD-10-CM | POA: Diagnosis not present

## 2024-03-06 DIAGNOSIS — G8929 Other chronic pain: Secondary | ICD-10-CM

## 2024-03-06 DIAGNOSIS — M6281 Muscle weakness (generalized): Secondary | ICD-10-CM

## 2024-03-06 NOTE — Progress Notes (Signed)
 Radiation Oncology Follow up Note  Name: Kiara [name]   Date:   03/06/2024 MRN:  [id number] DOB: 1934/12/08    This 89 y.o. female presents to the clinic today for 1 month follow-up status post LDR 4 osteoarthritis of the left knee.  REFERRING PROVIDER: Cleotilde Oneil FALCON, MD  HPI: Patient is an 89 year old female now out 1 month having completed LDR for osteoarthritis of the left knee.  Seen today in routine follow-up she is having no side effect she claims she has at least an 80% benefit in pain reduction from the treatments.  She does describe some pain in her lower shin area.  This he states is new although is not related to her knee or fibrous capsule.  Patient does states she has greater mobility in flexion of the left knee.  COMPLICATIONS OF TREATMENT: none  FOLLOW UP COMPLIANCE: keeps appointments   PHYSICAL EXAM:  BP (!) 169/79   Pulse 67   Temp 98.3 F (36.8 C) (Tympanic)   Resp 16   Wt 131 lb (59.4 kg)   BMI 22.49 kg/m  Range of motion of the lower knee is somewhat improved.  Proprioception is intact motor and sensory levels are equal symmetric in lower extremities.  RADIOLOGY RESULTS: No current films for review  PLAN: Present time patient's recent had an 80% reduction in pain in her left knee from LDR.  I am pleased with her overall progress.  She has no side effect profile.  Will see her back in 6 months for follow-up.  Patient knows to call with any concerns.  I would like to take this opportunity to thank you for allowing me to participate in the care of your patient.SABRA Marcey Penton, MD

## 2024-03-06 NOTE — Therapy (Signed)
 OUTPATIENT PHYSICAL THERAPY KNEE/BALANCE TREATMENT/ PROGRESS NOTE Dates of reporting period  01/24/2024 to  03/06/2024    Patient Name: Kiara [name] MRN: [id number] DOB:1934/10/17, 89 y.o., female Today's Date: 03/06/2024  END OF SESSION:  PT End of Session - 03/06/24 1432     Visit Number 10    Number of Visits 17    Date for Recertification  03/20/24    Authorization Type 10/27 - 12/31  Authorization #[id number] auth 18 visits    Authorization Time Period 10/27- 12/31    Authorization - Visit Number 10    Authorization - Number of Visits 18    Progress Note Due on Visit 10    PT Start Time 1431    PT Stop Time 1515    PT Time Calculation (min) 44 min    Activity Tolerance Patient tolerated treatment well    Behavior During Therapy [facility] for tasks assessed/performed              Past Medical History:  Diagnosis Date   Arthritis    osteoarthritis -right knee.   C. difficile colitis    past,no recent issues   Colitis    Diverticulitis    Fall    tripped-10 days ago-scrapped right knee and lateral right brow area- healing   GERD (gastroesophageal reflux disease)    occ.   Seasonal allergies    Past Surgical History:  Procedure Laterality Date   APPENDECTOMY     CATARACT EXTRACTION, BILATERAL     EYE SURGERY     ? right eye retina repair.   HEMORRHOID SURGERY     KNEE ARTHROSCOPY Right 11/26/2014   Procedure: ARTHROSCOPY KNEE, Tear medical horn and anterior horn. Lateral mesicus tear, grade 3 medial;  Surgeon: Kiara SHAUNNA Hue, MD;  Location: [facility];  Service: Orthopedics;  Laterality: Right;   MOLE REMOVAL     benign   TONSILLECTOMY     TOTAL KNEE ARTHROPLASTY Right 02/12/2016   Procedure: RIGHT TOTAL KNEE ARTHROPLASTY;  Surgeon: Kiara Moan, MD;  Location: [facility];  Service: Orthopedics;  Laterality: Right;   Patient Active Problem List   Diagnosis Date Noted   Mild cognitive impairment 02/02/2022   TIA (transient ischemic attack) 08/08/2020    Degenerative lumbar spinal stenosis 06/29/2019   Pulmonary nodules/lesions, multiple 02/08/2019   Exudative age-related macular degeneration of left eye with active choroidal neovascularization (HCC) 01/20/2019   Idiopathic peripheral neuropathy 01/20/2019   Radial styloid tenosynovitis 03/28/2018   Pain in right knee 02/23/2018   History of Clostridium difficile colitis 12/16/2017   Irritable bowel syndrome without diarrhea 12/16/2017   Cataract cortical, senile 08/03/2017   Chronic colitis 08/03/2017   Medicare annual wellness visit, initial 12/15/2016   Benign essential hypertension 11/04/2016   OA (osteoarthritis) of knee 02/12/2016   Hyperlipidemia, mixed 10/24/2015   Clostridium difficile colitis 01/02/2014   Right bundle branch block 10/18/2013    PCP: Kiara Oneil FALCON, MD  REFERRING PROVIDER: Marchia Drivers, MD  REFERRING DIAG: M17.0 (ICD-10-CM) - Primary osteoarthritis of both knees   RATIONALE FOR EVALUATION AND TREATMENT: Rehabilitation  THERAPY DIAG: Unsteadiness on feet  Muscle weakness (generalized)  Chronic pain of both knees  ONSET DATE: Chronic     FOLLOW-UP APPT SCHEDULED WITH REFERRING PROVIDER: No    SUBJECTIVE:  SUBJECTIVE STATEMENT:    Patient is an 89 year old female presenting with unsteadiness and R/L knee pain.   PERTINENT HISTORY:   Kiara [name] is an 89 year old female presenting to OPPT with a chief concern of balance deficits and bilateral knee pain. She has a history of TIA, falls and lumbar spinal stenosis and R TKA. Patient received corticosteroid injection the L knee. History of balance deficits primarily due to baseline peripheral neuropathy in bilateral feet contributing to difficulty walking. She reports that her balance has worsened within the last  month. Patient currently walking with hurry cane in the community. She reports that she drifts to one side (L > R) while walking. Patient reports that she will have directed radiation towards the L knee performed by her oncologist.   R Hand Dominant.   Imaging: No   PAIN:   Pain Intensity: Present: 0/10, Best: 0/10, Worst: 9/10 Pain location: bilateral knees  Pain quality: intermittent and sharp  Radiating pain: No  Swelling: No  Numbness/Tingling: Yes Focal weakness or buckling: No Aggravating factors: Walking Relieving factors: Sitting   PRECAUTIONS: Fall  WEIGHT BEARING RESTRICTIONS: No  FALLS: Has patient fallen in last 6 months? Yes. Number of falls 1  Living Environment Lives with: lives alone Lives in: House/apartment Stairs: No Has following equipment at home: Vannie - 4 wheeled and hurry cane  Prior level of function: Independent  Occupational demands: Retired  Presenter, Broadcasting: Taking care of her dog, walking   Patient Goals: I would like to be able to walk without assistance    OBJECTIVE:   PATIENT SURVEYS:  LEFS: 48/80 (80/80 = no disability) ABC: 74/100% - Moderate confidence    Cognition Patient is oriented to person, place, and time.  Recent memory is intact.  Accompanied by daughter: Kiara [name]   Gross Musculoskeletal Assessment Tremor: None Bulk: Normal Tone: Normal  GAIT: Distance walked: 43m  Assistive device utilized: hurry cane Level of assistance: Modified independence Comments: R hand hurry cane, reciprocal gait   Posture:  AROM AROM (Normal range in degrees) AROM  Hip Right Left  Flexion (125)    Extension (15)    Abduction (40)    Adduction     Internal Rotation (45)    External Rotation (45)        Knee    Flexion (135)    Extension (0)        Ankle    Dorsiflexion (20)    Plantarflexion (50)    Inversion (35)    Eversion (15    (* = pain; Blank rows = not tested)  LE MMT: MMT (out of 5) Right Left  Hip flexion 4- 4-   Hip extension    Hip abduction    Hip adduction    Hip internal rotation    Hip external rotation    Knee flexion 4- 4-  Knee extension 4 4  Ankle dorsiflexion 5 5  Ankle plantarflexion 5 5  Ankle inversion    Ankle eversion    (* = pain; Blank rows = not tested)  Sensation Grossly intact to light touch bilateral LEs as determined by testing dermatomes L2-S2. Proprioception, and hot/cold testing deferred on this date.  Reflexes R/L Knee Jerk (L3/4): 2+/1+  Ankle Jerk (S1/2): 2+/1+  Muscle Length Not tested  Palpation Location LEFT  RIGHT           Quadriceps 0 0  Medial Hamstrings 0 0  Lateral Hamstrings 0 0  Lateral Hamstring tendon  Medial Hamstring tendon    Quadriceps tendon 0 0  Patella    Patellar Tendon    Tibial Tuberosity    Medial joint line    Lateral joint line    MCL    LCL    Adductor Tubercle    Pes Anserine tendon    Infrapatellar fat pad    Fibular head    Popliteal fossa    (Blank rows = not tested) Graded on 0-4 scale (0 = no pain, 1 = pain, 2 = pain with wincing/grimacing/flinching, 3 = pain with withdrawal, 4 = unwilling to allow palpation), (Blank rows = not tested)  Passive Accessory Motion Deferred  Functional Test:  : 19.77 5TSTS: 15.88s  TODAY'S TREATMENT: DATE: 03/06/24  Precaution per MD 10/31: BP should be under 150/90 and above 100/60 in resting position. Systolic of 180-190 with sx prompt emergency care.   Subjective: She reports 1/10 NPS in the knee with walking. Denies Dizziness, lightheadedness, chest pain. No further questions or concerns.   Pre Exercise Vitals (Seated, Left Brachial):   Blood Pressure: 157/64  HR: 75  O2: 100   Therapeutic Activity:  NuStep L2-1 x 6 min x UE/LE (Seat 8) x > 70 SPM for LE endurance and strength for increased walking capacity; PT manually adjusted resistance throughout bout.   Sit to Stand from elevated Mat table (seated rest break in b/t sets)  3 x 10 - 3 Kg med  ball   Resisted Walking with Tidal Tank  3 x 73m - good ability to maintain balance with external perturbation  Forward Step Up - BUE Rail Support   R/L: 2 x 10   Sled Push   61m - 25# added to sled  56m - 50# added to sled  Discussed HEP and added exercises to the program.   Physical Performance Measure:  30s STS: 17 (Age related score - < 8)   The 30s STS test assesses lower body strength and endurance by measuring how many times a person can stand up from and sit down on a chair in 30 seconds  5TSTS: 9.87s  - ([place])   The Five Times Sit to Stand Test measures one aspect of transfer skill. The test provides a method to quantify functional lower extremity strength and/or identify movement strategies a patient uses to complete transitional movements.   80-89 years: 16.7  4.5 seconds (female), 17.2  5.5 seconds (female)   MMT: (see below)   Time Spent explaining procedure and results.    PATIENT EDUCATION:  Education details: Exercise Technique  Person educated: Patient Education method: Explanation, Demonstration, and Handouts Education comprehension: verbalized understanding and returned demonstration  HOME EXERCISE PROGRAM:  Access Code: [email or web address] URL: [email or web address] Date: 03/06/2024 Prepared by: Lonni Pall  Exercises - Standing March with Counter Support  - 1 x daily - 3-4 x weekly - 2-3 sets - 10-12 reps - Feet Together Balance at [employer] Eyes Closed  - 1 x daily - 7 x weekly - 3 sets - 30 hold - Sit to Stand with Arms Crossed  - 1 x daily - 7 x weekly - 3 sets - 10 reps - Semi-Tandem Balance at [employer] Eyes Open  - 1 x daily - 7 x weekly - 5 sets - 10 hold - Tandem Walking with Counter Support  - 1 x daily - 7 x weekly - 3 sets - 10 reps - Mini Squat with Counter Support  - 1 x daily - 3-4 x weekly -  2-3 sets - 10 reps  Access Code: [email or web address] URL: [email or web address] Date: 01/24/2024 Prepared by: Lonni Pall  Exercises - Standing March with Counter Support  - 1 x daily - 3-4 x weekly - 2-3 sets - 10-12 reps - Feet Together Balance at [employer] Eyes Closed  - 1 x daily - 7 x weekly - 3 sets - 30 hold - Sit to Stand with Armchair  - 1 x daily - 3-4 x weekly - 2-3 sets - 8-10 reps  ASSESSMENT:  CLINICAL IMPRESSION: Patient reports to OPPT for 10th appointment warranting progress note. Reassessed progress towards PT goals. Vital Signs with higher blood pressure readings that normal (see above) and patient provided appropriate rest breaks between interventions. Patient has demonstrated improvements in LE strength and functional mobility per 5TSTS and (see below). Per self report on LEFS and ABC patient has improved with functional movements and balance confidence (See goal below) PT updated goals to assess LE muscle endurance with 30s STS and dynamic balance per FGA (to be determined). New goals updated to LTG.   Her balance continues to improve without use of AD however she has an occasional minor lateral sway secondary to knee pain. Her pain in the L knee continues to be persistent however recent prednisone taper has reduced her pain significantly. PT POC remains appropriate. Patient still presents with deficits in LE strength and balance with uneven surfaces. This affects her full participation with prolonged walking, recreational activities at the beach and family. Patient will benefit from skilled PT in order to address current deficits and maximize return to PLOF.   OBJECTIVE IMPAIRMENTS: Abnormal gait, decreased activity tolerance, decreased balance, decreased endurance, difficulty walking, decreased ROM, decreased strength, impaired sensation, and pain.   ACTIVITY LIMITATIONS: carrying, lifting, standing, squatting, and stairs  PARTICIPATION LIMITATIONS: shopping, community activity, and yard work  PERSONAL FACTORS: Age, Past/current experiences, Profession, Sex, Time since onset of  injury/illness/exacerbation, and 1-2 comorbidities: HTN, Hx of TIA are also affecting patient's functional outcome.   REHAB POTENTIAL: Fair    CLINICAL DECISION MAKING: Evolving/moderate complexity  EVALUATION COMPLEXITY: Moderate   GOALS: Goals reviewed with patient? Yes  SHORT TERM GOALS: Target date: 03/06/2024  Pt will be independent with HEP to improve strength and decrease knee pain to improve pain-free function at home and work. Baseline: 01/24/2024: Initial HEP provided; 03/06/2024: Pt endorsed 65% adherence Goal status: Progressing   LONG TERM GOALS: Target date: 04/18/2023  Pt will improve ABC by at least 13% in order to demonstrate clinically significant improvement in balance confidence. Baseline: 01/24/2024: ABC score: 1190 / 1600 = 74.4 %;  03/06/2024: 85.62% Goal status: Progressing  2.  Pt will decrease worst knee pain by at least 3 points on the NPRS in order to demonstrate clinically significant reduction in knee pain. Baseline: 01/24/2024: 09/10 NPS; 02/28/2024: 10/10 NPS  Goal status: Progressing   3.  Pt will increase LEFS score by at least 9 points in order demonstrate clinically significant reduction in knee pain/disability.    Baseline: 01/24/2024: 48/80 (80/80 = no disability); 03/06/2024: 63/80 (80/80 = no disability) Goal status: Goal Met   4.  Pt will increase strength of knee flexion/extension by at least 1/2 MMT grade in order to demonstrate improvement in strength and function  Baseline: 01/24/2024: R/L: 4- knee Flexion, R/L 4/5 Knee Extension; 03/06/2024: R/L: 4+/4+ knee flexion, R/L: 4+/4+ Knee extension Goal status: Progressing  5.  Pt will increase by at least 0.13 m/s in order to demonstrate  clinically significant improvement in community ambulation.  Baseline: 01/24/2024: 19.77s without AD; 03/02/2024: .87 m/s without AD; 03/06/2024: 1.02 m/s Goal status: Goal met  6.  Pt will decrease 5TSTS by at least 3 seconds in order to  demonstrate clinically significant improvement in LE strength and functional ability.  Baseline: 01/24/2024: 15.88s; 03/06/2024: 9.85 m/s Goal status: Goal Met  7.  Pt will increase 30s STS repetitions by 5 reps in order to demonstrate improvements in LE strength and endurance.  Baseline: 03/06/2024: 17 reps  Goal status: Initial   8.  The patient will improve their Functional Gait Assessment (FGA) score from to >=22/30 to reduce fall risk and improve community mobility. Baseline: 03/06/2024: TBD  Goal status: Initial   PLAN:  PT FREQUENCY: 1-2x/week  PT DURATION: 12 weeks  PLANNED INTERVENTIONS: 97110-Therapeutic exercises, 97530- Therapeutic activity, 97112- Neuromuscular re-education, 97535- Self Care, [id number]- Manual therapy, [phone]- Gait training, Patient/Family education, Balance training, Stair training, Cryotherapy, and Moist heat  PLAN FOR NEXT SESSION: Progressing Gait Training with AD, Balance Training, LE strengthening  Lonni Pall PT, DPT Physical Therapist- [place]  03/06/2024, 2:35 PM

## 2024-03-08 ENCOUNTER — Ambulatory Visit

## 2024-03-08 DIAGNOSIS — R2681 Unsteadiness on feet: Secondary | ICD-10-CM

## 2024-03-08 DIAGNOSIS — M6281 Muscle weakness (generalized): Secondary | ICD-10-CM

## 2024-03-08 DIAGNOSIS — G8929 Other chronic pain: Secondary | ICD-10-CM

## 2024-03-08 NOTE — Therapy (Signed)
 OUTPATIENT PHYSICAL THERAPY KNEE/BALANCE TREATMENT   Patient Name: Kiara [name] MRN: [id number] DOB:[birth year], 89 y.o., female Today's Date: 03/08/2024  END OF SESSION:  PT End of Session - 03/08/24 1608     Visit Number 11    Number of Visits 17    Date for Recertification  03/20/24    Authorization Type 10/27 - 12/31  Authorization #[id number] auth 18 visits    Authorization Time Period 10/27- 12/31    Authorization - Visit Number 11    Authorization - Number of Visits 18    Progress Note Due on Visit 10    PT Start Time 1608    PT Stop Time 1645    PT Time Calculation (min) 37 min    Activity Tolerance Patient tolerated treatment well    Behavior During Therapy WFL for tasks assessed/performed              Past Medical History:  Diagnosis Date   Arthritis    osteoarthritis -right knee.   C. difficile colitis    past,no recent issues   Colitis    Diverticulitis    Fall    tripped-10 days ago-scrapped right knee and lateral right brow area- healing   GERD (gastroesophageal reflux disease)    occ.   Seasonal allergies    Past Surgical History:  Procedure Laterality Date   APPENDECTOMY     CATARACT EXTRACTION, BILATERAL     EYE SURGERY     ? right eye retina repair.   HEMORRHOID SURGERY     KNEE ARTHROSCOPY Right 11/26/2014   Procedure: ARTHROSCOPY KNEE, Tear medical horn and anterior horn. Lateral mesicus tear, grade 3 medial;  Surgeon: Lynwood SHAUNNA Hue, MD;  Location: [facility];  Service: Orthopedics;  Laterality: Right;   MOLE REMOVAL     benign   TONSILLECTOMY     TOTAL KNEE ARTHROPLASTY Right 02/12/2016   Procedure: RIGHT TOTAL KNEE ARTHROPLASTY;  Surgeon: Dempsey Moan, MD;  Location: [facility];  Service: Orthopedics;  Laterality: Right;   Patient Active Problem List   Diagnosis Date Noted   Mild cognitive impairment 02/02/2022   TIA (transient ischemic attack) 08/08/2020   Degenerative lumbar spinal stenosis 06/29/2019   Pulmonary  nodules/lesions, multiple 02/08/2019   Exudative age-related macular degeneration of left eye with active choroidal neovascularization (HCC) 01/20/2019   Idiopathic peripheral neuropathy 01/20/2019   Radial styloid tenosynovitis 03/28/2018   Pain in right knee 02/23/2018   History of Clostridium difficile colitis 12/16/2017   Irritable bowel syndrome without diarrhea 12/16/2017   Cataract cortical, senile 08/03/2017   Chronic colitis 08/03/2017   Medicare annual wellness visit, initial 12/15/2016   Benign essential hypertension 11/04/2016   OA (osteoarthritis) of knee 02/12/2016   Hyperlipidemia, mixed 10/24/2015   Clostridium difficile colitis 01/02/2014   Right bundle branch block 10/18/2013    PCP: Cleotilde Oneil FALCON, MD  REFERRING PROVIDER: Cleotilde Oneil FALCON, MD  REFERRING DIAG: M17.0 (ICD-10-CM) - Primary osteoarthritis of both knees   RATIONALE FOR EVALUATION AND TREATMENT: Rehabilitation  THERAPY DIAG: Unsteadiness on feet  Muscle weakness (generalized)  Chronic pain of both knees  ONSET DATE: Chronic     FOLLOW-UP APPT SCHEDULED WITH REFERRING PROVIDER: No    SUBJECTIVE:  SUBJECTIVE STATEMENT:    Patient is an 89 year old female presenting with unsteadiness and R/L knee pain.   PERTINENT HISTORY:   Kiara [name] is an 89 year old female presenting to OPPT with a chief concern of balance deficits and bilateral knee pain. She has a history of TIA, falls and lumbar spinal stenosis and R TKA. Patient received corticosteroid injection the L knee. History of balance deficits primarily due to baseline peripheral neuropathy in bilateral feet contributing to difficulty walking. She reports that her balance has worsened within the last month. Patient currently walking with hurry cane in the  community. She reports that she drifts to one side (L > R) while walking. Patient reports that she will have directed radiation towards the L knee performed by her oncologist.   R Hand Dominant.   Imaging: No   PAIN:   Pain Intensity: Present: 0/10, Best: 0/10, Worst: 9/10 Pain location: bilateral knees  Pain quality: intermittent and sharp  Radiating pain: No  Swelling: No  Numbness/Tingling: Yes Focal weakness or buckling: No Aggravating factors: Walking Relieving factors: Sitting   PRECAUTIONS: Fall  WEIGHT BEARING RESTRICTIONS: No  FALLS: Has patient fallen in last 6 months? Yes. Number of falls 1  Living Environment Lives with: lives alone Lives in: House/apartment Stairs: No Has following equipment at home: Vannie - 4 wheeled and hurry cane  Prior level of function: Independent  Occupational demands: Retired  Presenter, Broadcasting: Taking care of her dog, walking   Patient Goals: I would like to be able to walk without assistance    OBJECTIVE:   PATIENT SURVEYS:  LEFS: 48/80 (80/80 = no disability) ABC: 74/100% - Moderate confidence    Cognition Patient is oriented to person, place, and time.  Recent memory is intact.  Accompanied by daughter: Kiara [name]   Gross Musculoskeletal Assessment Tremor: None Bulk: Normal Tone: Normal  GAIT: Distance walked: 87m  Assistive device utilized: hurry cane Level of assistance: Modified independence Comments: R hand hurry cane, reciprocal gait   Posture:  AROM AROM (Normal range in degrees) AROM  Hip Right Left  Flexion (125)    Extension (15)    Abduction (40)    Adduction     Internal Rotation (45)    External Rotation (45)        Knee    Flexion (135)    Extension (0)        Ankle    Dorsiflexion (20)    Plantarflexion (50)    Inversion (35)    Eversion (15    (* = pain; Blank rows = not tested)  LE MMT: MMT (out of 5) Right Left  Hip flexion 4- 4-  Hip extension    Hip abduction    Hip adduction     Hip internal rotation    Hip external rotation    Knee flexion 4- 4-  Knee extension 4 4  Ankle dorsiflexion 5 5  Ankle plantarflexion 5 5  Ankle inversion    Ankle eversion    (* = pain; Blank rows = not tested)  Sensation Grossly intact to light touch bilateral LEs as determined by testing dermatomes L2-S2. Proprioception, and hot/cold testing deferred on this date.  Reflexes R/L Knee Jerk (L3/4): 2+/1+  Ankle Jerk (S1/2): 2+/1+  Muscle Length Not tested  Palpation Location LEFT  RIGHT           Quadriceps 0 0  Medial Hamstrings 0 0  Lateral Hamstrings 0 0  Lateral Hamstring tendon  Medial Hamstring tendon    Quadriceps tendon 0 0  Patella    Patellar Tendon    Tibial Tuberosity    Medial joint line    Lateral joint line    MCL    LCL    Adductor Tubercle    Pes Anserine tendon    Infrapatellar fat pad    Fibular head    Popliteal fossa    (Blank rows = not tested) Graded on 0-4 scale (0 = no pain, 1 = pain, 2 = pain with wincing/grimacing/flinching, 3 = pain with withdrawal, 4 = unwilling to allow palpation), (Blank rows = not tested)  Passive Accessory Motion Deferred  Functional Test:  : 19.77 5TSTS: 15.88s  TODAY'S TREATMENT: DATE: 03/08/24  Precaution per MD 10/31: BP should be under 150/90 and above 100/60 in resting position. Systolic of 180-190 with sx prompt emergency care.   Subjective: She reports 2/10 NPS in the knee with walking. Denies Dizziness, lightheadedness, chest pain. No further questions or concerns.   Pre Exercise Vitals (Seated, Left Brachial):   Blood Pressure: 128/59  HR: 75  O2: 100    Physical Performance Measure  FGA:  FGA: 20/30 (Patient with AD) Cutoff: Non-Specific Older Adults: <=22/30 = risk of falls  The FGA is used to assess postural stability during walking and assesses an individual's ability to perform multiple motor tasks while walking.    [facility] PT Assessment - 03/08/24 0001       Functional  Gait  Assessment   Gait assessed  Yes    Gait Level Surface Walks 20 ft in less than 7 sec but greater than 5.5 sec, uses assistive device, slower speed, mild gait deviations, or deviates 6-10 in outside of the 12 in walkway width.    Change in Gait Speed Able to change speed, demonstrates mild gait deviations, deviates 6-10 in outside of the 12 in walkway width, or no gait deviations, unable to achieve a major change in velocity, or uses a change in velocity, or uses an assistive device.    Gait with Horizontal Head Turns Performs head turns smoothly with no change in gait. Deviates no more than 6 in outside 12 in walkway width    Gait with Vertical Head Turns Performs task with slight change in gait velocity (eg, minor disruption to smooth gait path), deviates 6 - 10 in outside 12 in walkway width or uses assistive device    Gait and Pivot Turn Pivot turns safely within 3 sec and stops quickly with no loss of balance.    Step Over Obstacle Is able to step over 2 stacked shoe boxes taped together (9 in total height) without changing gait speed. No evidence of imbalance.    Gait with Narrow Base of Support Ambulates less than 4 steps heel to toe or cannot perform without assistance.    Gait with Eyes Closed Walks 20 ft, slow speed, abnormal gait pattern, evidence for imbalance, deviates 10-15 in outside 12 in walkway width. Requires more than 9 sec to ambulate 20 ft.    Ambulating Backwards Walks 20 ft, uses assistive device, slower speed, mild gait deviations, deviates 6-10 in outside 12 in walkway width.    Steps Alternating feet, must use rail.    Total Score 20    FGA comment: w/o AD           Therapeutic Activity:  NuStep L2-1 x 6 min x UE/LE (Seat 8) x > 70 SPM for LE endurance and strength for  increased walking capacity; PT manually adjusted resistance throughout bout.   Sit to Stand from elevated Mat table   2 x 10 - 20 with 2 Kg MB  1 x 10 - 20 with 2 Kg MB Med ball  throw   Standing on Balance Pad with ball throw/catch  1 x 15 - Green [employer]  1 x 10 - to the wall   1 x 10 - to a target on the wall (physioball holder)     PATIENT EDUCATION:  Education details: Exercise Technique  Person educated: Patient Education method: Explanation, Demonstration, and Handouts Education comprehension: verbalized understanding and returned demonstration  HOME EXERCISE PROGRAM:  Access Code: [email or web address] URL: [email or web address] Date: 03/06/2024 Prepared by: Lonni Pall  Exercises - Standing March with Counter Support  - 1 x daily - 3-4 x weekly - 2-3 sets - 10-12 reps - Feet Together Balance at [employer] Eyes Closed  - 1 x daily - 7 x weekly - 3 sets - 30 hold - Sit to Stand with Arms Crossed  - 1 x daily - 7 x weekly - 3 sets - 10 reps - Semi-Tandem Balance at [employer] Eyes Open  - 1 x daily - 7 x weekly - 5 sets - 10 hold - Tandem Walking with Counter Support  - 1 x daily - 7 x weekly - 3 sets - 10 reps - Mini Squat with Counter Support  - 1 x daily - 3-4 x weekly - 2-3 sets - 10 reps  Access Code: [email or web address] URL: [email or web address] Date: 01/24/2024 Prepared by: Lonni Pall  Exercises - Standing March with Counter Support  - 1 x daily - 3-4 x weekly - 2-3 sets - 10-12 reps - Feet Together Balance at [employer] Eyes Closed  - 1 x daily - 7 x weekly - 3 sets - 30 hold - Sit to Stand with Armchair  - 1 x daily - 3-4 x weekly - 2-3 sets - 8-10 reps  ASSESSMENT:  CLINICAL IMPRESSION: Patient returns to OPPT for continued management of unsteadiness and knee pain. FGA assessed today (see above for details), score indicates that patient is at risk for falls. Severe deficits noted with narrow BoS, eyes closed and retro stepping. Patient's retro stepping improved with VC for increased stepping length. Remainder of the session focused on improving balance against external perturbations (push, throw,  and catching).  Squat and ball throw activity, patient with intermittent spontaneous small jump and good ability to maintain balance. However with narrow BoS patient with forward propulsion during squat and throw requiring PT assistance to maintain balance. Patient will be taking small hiatus from formal PT due to traveling, PT encouraged strong adherence to HEP in order to maintain progress. Patient still presents with deficits in LE strength and balance with uneven surfaces. This affects her full participation with prolonged walking, recreational activities at the beach and family. Patient will benefit from skilled PT in order to address current deficits and maximize return to PLOF.   OBJECTIVE IMPAIRMENTS: Abnormal gait, decreased activity tolerance, decreased balance, decreased endurance, difficulty walking, decreased ROM, decreased strength, impaired sensation, and pain.   ACTIVITY LIMITATIONS: carrying, lifting, standing, squatting, and stairs  PARTICIPATION LIMITATIONS: shopping, community activity, and yard work  PERSONAL FACTORS: Age, Past/current experiences, Profession, Sex, Time since onset of injury/illness/exacerbation, and 1-2 comorbidities: HTN, Hx of TIA are also affecting patient's functional outcome.   REHAB POTENTIAL: Fair    CLINICAL DECISION MAKING: Evolving/moderate complexity  EVALUATION COMPLEXITY: Moderate   GOALS: Goals reviewed with patient? Yes  SHORT TERM GOALS: Target date: 03/06/2024  Pt will be independent with HEP to improve strength and decrease knee pain to improve pain-free function at home and work. Baseline: 01/24/2024: Initial HEP provided; 03/06/2024: Pt endorsed 65% adherence Goal status: Progressing   LONG TERM GOALS: Target date: 04/18/2023  Pt will improve ABC by at least 13% in order to demonstrate clinically significant improvement in balance confidence. Baseline: 01/24/2024: ABC score: 1190 / 1600 = 74.4 %;  03/06/2024: 85.62% Goal  status: Progressing  2.  Pt will decrease worst knee pain by at least 3 points on the NPRS in order to demonstrate clinically significant reduction in knee pain. Baseline: 01/24/2024: 09/10 NPS; 02/28/2024: 10/10 NPS  Goal status: Progressing   3.  Pt will increase LEFS score by at least 9 points in order demonstrate clinically significant reduction in knee pain/disability.    Baseline: 01/24/2024: 48/80 (80/80 = no disability); 03/06/2024: 63/80 (80/80 = no disability) Goal status: Goal Met   4.  Pt will increase strength of knee flexion/extension by at least 1/2 MMT grade in order to demonstrate improvement in strength and function  Baseline: 01/24/2024: R/L: 4- knee Flexion, R/L 4/5 Knee Extension; 03/06/2024: R/L: 4+/4+ knee flexion, R/L: 4+/4+ Knee extension Goal status: Progressing  5.  Pt will increase by at least 0.13 m/s in order to demonstrate clinically significant improvement in community ambulation.  Baseline: 01/24/2024: 19.77s without AD; 03/02/2024: .87 m/s without AD; 03/06/2024: 1.02 m/s Goal status: Goal met  6.  Pt will decrease 5TSTS by at least 3 seconds in order to demonstrate clinically significant improvement in LE strength and functional ability.  Baseline: 01/24/2024: 15.88s; 03/06/2024: 9.85 m/s Goal status: Goal Met  7.  Pt will increase 30s STS repetitions by 5 reps in order to demonstrate improvements in LE strength and endurance.  Baseline: 03/06/2024: 17 reps  Goal status: Initial   8.  The patient will improve their Functional Gait Assessment (FGA) score from to >=22/30 to reduce fall risk and improve community mobility. Baseline: 03/06/2024: TBD  Goal status: Initial   PLAN:  PT FREQUENCY: 1-2x/week  PT DURATION: 12 weeks  PLANNED INTERVENTIONS: 97110-Therapeutic exercises, 97530- Therapeutic activity, 97112- Neuromuscular re-education, 97535- Self Care, [id number]- Manual therapy, [phone]- Gait training, Patient/Family education, Balance  training, Stair training, Cryotherapy, and Moist heat  PLAN FOR NEXT SESSION: Progressing Gait Training with AD, Balance Training, LE strengthening  Lonni Pall PT, DPT Physical Therapist- [place]  03/08/2024, 4:12 PM

## 2024-03-13 ENCOUNTER — Ambulatory Visit

## 2024-03-15 ENCOUNTER — Ambulatory Visit

## 2024-03-16 ENCOUNTER — Ambulatory Visit: Admitting: Radiation Oncology

## 2024-03-20 ENCOUNTER — Ambulatory Visit

## 2024-03-27 ENCOUNTER — Ambulatory Visit

## 2024-03-27 DIAGNOSIS — R2681 Unsteadiness on feet: Secondary | ICD-10-CM | POA: Diagnosis not present

## 2024-03-27 DIAGNOSIS — G8929 Other chronic pain: Secondary | ICD-10-CM

## 2024-03-27 DIAGNOSIS — M6281 Muscle weakness (generalized): Secondary | ICD-10-CM

## 2024-03-27 NOTE — Therapy (Signed)
 " OUTPATIENT PHYSICAL THERAPY KNEE/BALANCE TREATMENT/RE CERTIFICATION   Patient Name: Kiara [name] MRN: [id number] DOB:[birth year], 89 y.o., female Today's Date: 03/27/2024  END OF SESSION:  PT End of Session - 03/27/24 1653     Visit Number 12    Number of Visits 17    Date for Recertification  05/01/24    Authorization Type 10/27 - 12/31  Authorization #[id number] auth 18 visits    Authorization Time Period 10/27- 12/31    Authorization - Visit Number 12    Authorization - Number of Visits 18    Progress Note Due on Visit 10    PT Start Time 1655    PT Stop Time 1730    PT Time Calculation (min) 35 min    Activity Tolerance Patient tolerated treatment well    Behavior During Therapy WFL for tasks assessed/performed               Past Medical History:  Diagnosis Date   Arthritis    osteoarthritis -right knee.   C. difficile colitis    past,no recent issues   Colitis    Diverticulitis    Fall    tripped-10 days ago-scrapped right knee and lateral right brow area- healing   GERD (gastroesophageal reflux disease)    occ.   Seasonal allergies    Past Surgical History:  Procedure Laterality Date   APPENDECTOMY     CATARACT EXTRACTION, BILATERAL     EYE SURGERY     ? right eye retina repair.   HEMORRHOID SURGERY     KNEE ARTHROSCOPY Right 11/26/2014   Procedure: ARTHROSCOPY KNEE, Tear medical horn and anterior horn. Lateral mesicus tear, grade 3 medial;  Surgeon: Lynwood SHAUNNA Hue, MD;  Location: [facility];  Service: Orthopedics;  Laterality: Right;   MOLE REMOVAL     benign   TONSILLECTOMY     TOTAL KNEE ARTHROPLASTY Right 02/12/2016   Procedure: RIGHT TOTAL KNEE ARTHROPLASTY;  Surgeon: Dempsey Moan, MD;  Location: [facility];  Service: Orthopedics;  Laterality: Right;   Patient Active Problem List   Diagnosis Date Noted   Mild cognitive impairment 02/02/2022   TIA (transient ischemic attack) 08/08/2020   Degenerative lumbar spinal stenosis 06/29/2019    Pulmonary nodules/lesions, multiple 02/08/2019   Exudative age-related macular degeneration of left eye with active choroidal neovascularization (HCC) 01/20/2019   Idiopathic peripheral neuropathy 01/20/2019   Radial styloid tenosynovitis 03/28/2018   Pain in right knee 02/23/2018   History of Clostridium difficile colitis 12/16/2017   Irritable bowel syndrome without diarrhea 12/16/2017   Cataract cortical, senile 08/03/2017   Chronic colitis 08/03/2017   Medicare annual wellness visit, initial 12/15/2016   Benign essential hypertension 11/04/2016   OA (osteoarthritis) of knee 02/12/2016   Hyperlipidemia, mixed 10/24/2015   Clostridium difficile colitis 01/02/2014   Right bundle branch block 10/18/2013    PCP: Cleotilde Oneil FALCON, MD  REFERRING PROVIDER: Marchia Drivers, MD  REFERRING DIAG: M17.0 (ICD-10-CM) - Primary osteoarthritis of both knees   RATIONALE FOR EVALUATION AND TREATMENT: Rehabilitation  THERAPY DIAG: Unsteadiness on feet  Muscle weakness (generalized)  Chronic pain of both knees  ONSET DATE: Chronic     FOLLOW-UP APPT SCHEDULED WITH REFERRING PROVIDER: No    SUBJECTIVE:  SUBJECTIVE STATEMENT:    Patient is an 89 year old female presenting with unsteadiness and R/L knee pain.   PERTINENT HISTORY:   Kiara [name] is an 89 year old female presenting to OPPT with a chief concern of balance deficits and bilateral knee pain. She has a history of TIA, falls and lumbar spinal stenosis and R TKA. Patient received corticosteroid injection the L knee. History of balance deficits primarily due to baseline peripheral neuropathy in bilateral feet contributing to difficulty walking. She reports that her balance has worsened within the last month. Patient currently walking with hurry cane in  the community. She reports that she drifts to one side (L > R) while walking. Patient reports that she will have directed radiation towards the L knee performed by her oncologist.   R Hand Dominant.   Imaging: No   PAIN:   Pain Intensity: Present: 0/10, Best: 0/10, Worst: 9/10 Pain location: bilateral knees  Pain quality: intermittent and sharp  Radiating pain: No  Swelling: No  Numbness/Tingling: Yes Focal weakness or buckling: No Aggravating factors: Walking Relieving factors: Sitting   PRECAUTIONS: Fall  WEIGHT BEARING RESTRICTIONS: No  FALLS: Has patient fallen in last 6 months? Yes. Number of falls 1  Living Environment Lives with: lives alone Lives in: House/apartment Stairs: No Has following equipment at home: Vannie - 4 wheeled and hurry cane  Prior level of function: Independent  Occupational demands: Retired  Presenter, Broadcasting: Taking care of her dog, walking   Patient Goals: I would like to be able to walk without assistance    OBJECTIVE:   PATIENT SURVEYS:  LEFS: 48/80 (80/80 = no disability) ABC: 74/100% - Moderate confidence    Cognition Patient is oriented to person, place, and time.  Recent memory is intact.  Accompanied by daughter: [name]   Gross Musculoskeletal Assessment Tremor: None Bulk: Normal Tone: Normal  GAIT: Distance walked: 11m  Assistive device utilized: hurry cane Level of assistance: Modified independence Comments: R hand hurry cane, reciprocal gait   Posture:  AROM AROM (Normal range in degrees) AROM  Hip Right Left  Flexion (125)    Extension (15)    Abduction (40)    Adduction     Internal Rotation (45)    External Rotation (45)        Knee    Flexion (135)    Extension (0)        Ankle    Dorsiflexion (20)    Plantarflexion (50)    Inversion (35)    Eversion (15    (* = pain; Blank rows = not tested)  LE MMT: MMT (out of 5) Right Left  Hip flexion 4- 4-  Hip extension    Hip abduction    Hip adduction     Hip internal rotation    Hip external rotation    Knee flexion 4- 4-  Knee extension 4 4  Ankle dorsiflexion 5 5  Ankle plantarflexion 5 5  Ankle inversion    Ankle eversion    (* = pain; Blank rows = not tested)  Sensation Grossly intact to light touch bilateral LEs as determined by testing dermatomes L2-S2. Proprioception, and hot/cold testing deferred on this date.  Reflexes R/L Knee Jerk (L3/4): 2+/1+  Ankle Jerk (S1/2): 2+/1+  Muscle Length Not tested  Palpation Location LEFT  RIGHT           Quadriceps 0 0  Medial Hamstrings 0 0  Lateral Hamstrings 0 0  Lateral Hamstring tendon  Medial Hamstring tendon    Quadriceps tendon 0 0  Patella    Patellar Tendon    Tibial Tuberosity    Medial joint line    Lateral joint line    MCL    LCL    Adductor Tubercle    Pes Anserine tendon    Infrapatellar fat pad    Fibular head    Popliteal fossa    (Blank rows = not tested) Graded on 0-4 scale (0 = no pain, 1 = pain, 2 = pain with wincing/grimacing/flinching, 3 = pain with withdrawal, 4 = unwilling to allow palpation), (Blank rows = not tested)  Passive Accessory Motion Deferred  Functional Test:  : 19.77 5TSTS: 15.88s  TODAY'S TREATMENT: DATE: 03/27/2024  Precaution per MD 10/31: BP should be under 150/90 and above 100/60 in resting position. Systolic of 180-190 with sx prompt emergency care.   Subjective: Patient reported that she fell during her trip in [place] (03/14/2024). Patient did have trauma to the head but was cleared by EMS for red flags. Patient denies hearing, visual and taste changes. Patient reports 0/10 pain in her L knee prior to start. No further questions or concerns.   Pre Exercise: Vitals (Seated, Left Brachial):   Blood Pressure: 125/58  HR: 72  O2: 100   Therapeutic Activity:  NuStep L2-1 x 6 min x UE/LE (Seat 8) x > 70 SPM for LE endurance and strength for increased walking capacity; PT manually adjusted resistance  throughout bout.   Sit to Stand with OH reach (From 20 Mat Table)   2 x 10 - 20 with 2 Kg MB   Squat and OH reach to various wall targets (Letters, numbers, shapes)  1 x 10 - with 2 Kg MB   Neuromuscular Re-education:  Forward and Retro Gait with Random Stopping/Directional Changes   5 min - Close SBA   - no major LOB, able to maintain balance with immediate stop   Walking with Tidal Tank and Random Directional Changes/Stopping/Velocity Changes   5 min - Close SBA  - no major LOB with Tidal Tank, patient able to walk backwards withoout LOB   Ladder Drill   2 Fwd 1 Back x 1   Forward Side Back x 2   Sideways in each box x 2    5x small hurdles with 4 random ankle weights for uneven surface and SLS   PATIENT EDUCATION:  Education details: Exercise Technique  Person educated: Patient Education method: Explanation, Demonstration, and Handouts Education comprehension: verbalized understanding and returned demonstration  HOME EXERCISE PROGRAM:  Access Code: [email or web address] URL: [email or web address] Date: 03/06/2024 Prepared by: Lonni Pall  Exercises - Standing March with Counter Support  - 1 x daily - 3-4 x weekly - 2-3 sets - 10-12 reps - Feet Together Balance at [employer] Eyes Closed  - 1 x daily - 7 x weekly - 3 sets - 30 hold - Sit to Stand with Arms Crossed  - 1 x daily - 7 x weekly - 3 sets - 10 reps - Semi-Tandem Balance at [employer] Eyes Open  - 1 x daily - 7 x weekly - 5 sets - 10 hold - Tandem Walking with Counter Support  - 1 x daily - 7 x weekly - 3 sets - 10 reps - Mini Squat with Counter Support  - 1 x daily - 3-4 x weekly - 2-3 sets - 10 reps  Access Code: [email or web address] URL: [email or web address] Date: 01/24/2024 Prepared by: Lonni Pall  Exercises -  Standing March with Counter Support  - 1 x daily - 3-4 x weekly - 2-3 sets - 10-12 reps - Feet Together Balance at [employer] Eyes Closed  - 1 x daily - 7 x weekly - 3 sets - 30  hold - Sit to Stand with Armchair  - 1 x daily - 3-4 x weekly - 2-3 sets - 8-10 reps  ASSESSMENT:  CLINICAL IMPRESSION: Continued PT POC in management of unsteadiness and L knee pain. Session limited due to late arrival. Main focus on challenging patient's dynamic balance with dual tasking. Patient with visible discoloration around R orbit and raised skin along R frontal bone. TTP along R fore head however she denies dizziness or changes in diplopia.  Her dynamic balance continues to improve however external perturbations (visual or physical) will intermittently cause a postural sway leading to stepping reaction to maintain balance. One instance of patient requiring physical assistance with hurdle clearance. Patient unable to maintain prolonged SLS; PT educated patient on reducing SLS and squatting in order to improve BoS. Blood Pressure monitored throughout session; VSS. Intermittent seated rest breaks provided to mitigate fatigue. Patient still presents with deficits in LE strength and balance with uneven surfaces. This affects her full participation with prolonged walking, recreational activities at the beach and family. PT strongly recommends continuation of PT POC 2x/week for the next 5 weeks to address current deficits and maximize decreased fall risk for safe discharge.    OBJECTIVE IMPAIRMENTS: Abnormal gait, decreased activity tolerance, decreased balance, decreased endurance, difficulty walking, decreased ROM, decreased strength, impaired sensation, and pain.   ACTIVITY LIMITATIONS: carrying, lifting, standing, squatting, and stairs  PARTICIPATION LIMITATIONS: shopping, community activity, and yard work  PERSONAL FACTORS: Age, Past/current experiences, Profession, Sex, Time since onset of injury/illness/exacerbation, and 1-2 comorbidities: HTN, Hx of TIA are also affecting patient's functional outcome.   REHAB POTENTIAL: Fair    CLINICAL DECISION MAKING: Evolving/moderate  complexity  EVALUATION COMPLEXITY: Moderate   GOALS: Goals reviewed with patient? Yes  SHORT TERM GOALS: Target date: 03/06/2024  Pt will be independent with HEP to improve strength and decrease knee pain to improve pain-free function at home and work. Baseline: 01/24/2024: Initial HEP provided; 03/06/2024: Pt endorsed 65% adherence Goal status: Progressing   LONG TERM GOALS: Target date: 05/01/2024   Pt will improve ABC by at least 13% in order to demonstrate clinically significant improvement in balance confidence. Baseline: 01/24/2024: ABC score: 1190 / 1600 = 74.4 %;  03/06/2024: 85.62% Goal status: Progressing  2.  Pt will decrease worst knee pain by at least 3 points on the NPRS in order to demonstrate clinically significant reduction in knee pain. Baseline: 01/24/2024: 09/10 NPS; 02/28/2024: 10/10 NPS; 03/27/2024:  Goal status: Progressing   3.  Pt will increase LEFS score by at least 9 points in order demonstrate clinically significant reduction in knee pain/disability.    Baseline: 01/24/2024: 48/80 (80/80 = no disability); 03/06/2024: 63/80 (80/80 = no disability) Goal status: Goal Met   4.  Pt will increase strength of knee flexion/extension by at least 1/2 MMT grade in order to demonstrate improvement in strength and function  Baseline: 01/24/2024: R/L: 4- knee Flexion, R/L 4/5 Knee Extension; 03/06/2024: R/L: 4+/4+ knee flexion, R/L: 4+/4+ Knee extension Goal status: Progressing  5.  Pt will increase by at least 0.13 m/s in order to demonstrate clinically significant improvement in community ambulation.  Baseline: 01/24/2024: 19.77s without AD; 03/02/2024: .87 m/s without AD; 03/06/2024: 1.02 m/s Goal status: Goal met  6.  Pt will decrease 5TSTS by at least 3 seconds in order to demonstrate clinically significant improvement in LE strength and functional ability.  Baseline: 01/24/2024: 15.88s; 03/06/2024: 9.85 m/s Goal status: Goal Met  7.  Pt will increase  30s STS repetitions by 5 reps in order to demonstrate improvements in LE strength and endurance.  Baseline: 03/06/2024: 17 reps  Goal status: Initial   8.  The patient will improve their Functional Gait Assessment (FGA) score from to >=22/30 to reduce fall risk and improve community mobility. Baseline: 03/06/2024: 20/30 ( <22/30 - high fall risk)   Goal status: Initial   PLAN:  PT FREQUENCY: 1-2x/week  PT DURATION: 12 weeks  PLANNED INTERVENTIONS: 97110-Therapeutic exercises, 97530- Therapeutic activity, 97112- Neuromuscular re-education, 97535- Self Care, [id number]- Manual therapy, [phone]- Gait training, Patient/Family education, Balance training, Stair training, Cryotherapy, and Moist heat  PLAN FOR NEXT SESSION: Progressing Gait Training with AD, Balance Training, LE strengthening, Reactional Training   Lonni Pall PT, DPT Physical Therapist-   03/27/2024, 5:39 PM  "

## 2024-03-29 ENCOUNTER — Ambulatory Visit

## 2024-03-29 DIAGNOSIS — R2681 Unsteadiness on feet: Secondary | ICD-10-CM

## 2024-03-29 DIAGNOSIS — G8929 Other chronic pain: Secondary | ICD-10-CM

## 2024-03-29 DIAGNOSIS — M6281 Muscle weakness (generalized): Secondary | ICD-10-CM

## 2024-03-29 NOTE — Therapy (Signed)
 " OUTPATIENT PHYSICAL THERAPY KNEE/BALANCE TREATMENT   Patient Name: Kiara [name] MRN: [id number] DOB:1934-08-18, 89 y.o., female Today's Date: 03/29/2024  END OF SESSION:  PT End of Session - 03/29/24 1357     Visit Number 13    Number of Visits 17    Date for Recertification  05/01/24    Authorization Type auth 26 visits  10/27 - 05/27/24  Authorization #[id number]    Authorization Time Period 10/27 - 05/27/24    Authorization - Visit Number 13    Authorization - Number of Visits 26    Progress Note Due on Visit 20    PT Start Time 1350    PT Stop Time 1430    PT Time Calculation (min) 40 min    Activity Tolerance Patient tolerated treatment well    Behavior During Therapy WFL for tasks assessed/performed                Past Medical History:  Diagnosis Date   Arthritis    osteoarthritis -right knee.   C. difficile colitis    past,no recent issues   Colitis    Diverticulitis    Fall    tripped-10 days ago-scrapped right knee and lateral right brow area- healing   GERD (gastroesophageal reflux disease)    occ.   Seasonal allergies    Past Surgical History:  Procedure Laterality Date   APPENDECTOMY     CATARACT EXTRACTION, BILATERAL     EYE SURGERY     ? right eye retina repair.   HEMORRHOID SURGERY     KNEE ARTHROSCOPY Right 11/26/2014   Procedure: ARTHROSCOPY KNEE, Tear medical horn and anterior horn. Lateral mesicus tear, grade 3 medial;  Surgeon: Lynwood SHAUNNA Hue, MD;  Location: [facility];  Service: Orthopedics;  Laterality: Right;   MOLE REMOVAL     benign   TONSILLECTOMY     TOTAL KNEE ARTHROPLASTY Right 02/12/2016   Procedure: RIGHT TOTAL KNEE ARTHROPLASTY;  Surgeon: Dempsey Moan, MD;  Location: [facility];  Service: Orthopedics;  Laterality: Right;   Patient Active Problem List   Diagnosis Date Noted   Mild cognitive impairment 02/02/2022   TIA (transient ischemic attack) 08/08/2020   Degenerative lumbar spinal stenosis 06/29/2019   Pulmonary  nodules/lesions, multiple 02/08/2019   Exudative age-related macular degeneration of left eye with active choroidal neovascularization (HCC) 01/20/2019   Idiopathic peripheral neuropathy 01/20/2019   Radial styloid tenosynovitis 03/28/2018   Pain in right knee 02/23/2018   History of Clostridium difficile colitis 12/16/2017   Irritable bowel syndrome without diarrhea 12/16/2017   Cataract cortical, senile 08/03/2017   Chronic colitis 08/03/2017   Medicare annual wellness visit, initial 12/15/2016   Benign essential hypertension 11/04/2016   OA (osteoarthritis) of knee 02/12/2016   Hyperlipidemia, mixed 10/24/2015   Clostridium difficile colitis 01/02/2014   Right bundle branch block 10/18/2013    PCP: Cleotilde Oneil FALCON, MD  REFERRING PROVIDER: Marchia Drivers, MD  REFERRING DIAG: M17.0 (ICD-10-CM) - Primary osteoarthritis of both knees   RATIONALE FOR EVALUATION AND TREATMENT: Rehabilitation  THERAPY DIAG: Unsteadiness on feet  Muscle weakness (generalized)  Chronic pain of both knees  ONSET DATE: Chronic     FOLLOW-UP APPT SCHEDULED WITH REFERRING PROVIDER: No    SUBJECTIVE:  SUBJECTIVE STATEMENT:    Patient is an 89 year old female presenting with unsteadiness and R/L knee pain.   PERTINENT HISTORY:   Kiara [name] is an 89 year old female presenting to OPPT with a chief concern of balance deficits and bilateral knee pain. She has a history of TIA, falls and lumbar spinal stenosis and R TKA. Patient received corticosteroid injection the L knee. History of balance deficits primarily due to baseline peripheral neuropathy in bilateral feet contributing to difficulty walking. She reports that her balance has worsened within the last month. Patient currently walking with hurry cane in the  community. She reports that she drifts to one side (L > R) while walking. Patient reports that she will have directed radiation towards the L knee performed by her oncologist.   R Hand Dominant.   Imaging: No   PAIN:   Pain Intensity: Present: 0/10, Best: 0/10, Worst: 9/10 Pain location: bilateral knees  Pain quality: intermittent and sharp  Radiating pain: No  Swelling: No  Numbness/Tingling: Yes Focal weakness or buckling: No Aggravating factors: Walking Relieving factors: Sitting   PRECAUTIONS: Fall  WEIGHT BEARING RESTRICTIONS: No  FALLS: Has patient fallen in last 6 months? Yes. Number of falls 1  Living Environment Lives with: lives alone Lives in: House/apartment Stairs: No Has following equipment at home: Vannie - 4 wheeled and hurry cane  Prior level of function: Independent  Occupational demands: Retired  Presenter, Broadcasting: Taking care of her dog, walking   Patient Goals: I would like to be able to walk without assistance    OBJECTIVE:   PATIENT SURVEYS:  LEFS: 48/80 (80/80 = no disability) ABC: 74/100% - Moderate confidence    Cognition Patient is oriented to person, place, and time.  Recent memory is intact.  Accompanied by daughter: [name]   Gross Musculoskeletal Assessment Tremor: None Bulk: Normal Tone: Normal  GAIT: Distance walked: 65m  Assistive device utilized: hurry cane Level of assistance: Modified independence Comments: R hand hurry cane, reciprocal gait   Posture:  AROM AROM (Normal range in degrees) AROM  Hip Right Left  Flexion (125)    Extension (15)    Abduction (40)    Adduction     Internal Rotation (45)    External Rotation (45)        Knee    Flexion (135)    Extension (0)        Ankle    Dorsiflexion (20)    Plantarflexion (50)    Inversion (35)    Eversion (15    (* = pain; Blank rows = not tested)  LE MMT: MMT (out of 5) Right Left  Hip flexion 4- 4-  Hip extension    Hip abduction    Hip adduction     Hip internal rotation    Hip external rotation    Knee flexion 4- 4-  Knee extension 4 4  Ankle dorsiflexion 5 5  Ankle plantarflexion 5 5  Ankle inversion    Ankle eversion    (* = pain; Blank rows = not tested)  Sensation Grossly intact to light touch bilateral LEs as determined by testing dermatomes L2-S2. Proprioception, and hot/cold testing deferred on this date.  Reflexes R/L Knee Jerk (L3/4): 2+/1+  Ankle Jerk (S1/2): 2+/1+  Muscle Length Not tested  Palpation Location LEFT  RIGHT           Quadriceps 0 0  Medial Hamstrings 0 0  Lateral Hamstrings 0 0  Lateral Hamstring tendon  Medial Hamstring tendon    Quadriceps tendon 0 0  Patella    Patellar Tendon    Tibial Tuberosity    Medial joint line    Lateral joint line    MCL    LCL    Adductor Tubercle    Pes Anserine tendon    Infrapatellar fat pad    Fibular head    Popliteal fossa    (Blank rows = not tested) Graded on 0-4 scale (0 = no pain, 1 = pain, 2 = pain with wincing/grimacing/flinching, 3 = pain with withdrawal, 4 = unwilling to allow palpation), (Blank rows = not tested)  Passive Accessory Motion Deferred  Functional Test:  : 19.77 5TSTS: 15.88s  TODAY'S TREATMENT: DATE: 03/29/2024  Precaution per MD 10/31: BP should be under 150/90 and above 100/60 in resting position. Systolic of 180-190 with sx prompt emergency care.   Subjective: Patient reported that she fell during her trip in [place] (03/14/2024). Patient did have trauma to the head but was cleared by EMS for red flags. Patient denies hearing, visual and taste changes. Patient reports 0/10 pain in her L knee prior to start. No further questions or concerns.   Pre Exercise: Vitals (Seated, Left Brachial):   Blood Pressure: 17058  HR: 63  O2: 100  Post Exercise:  Vitals (Seated, Left Brachial):   Blood Pressure: 17058  HR: 63  O2: 100     Therapeutic Activity:  Resisted Walking (4# DB in BUE)  ~400 ft - pt with  spontaneous high marching, no LOB   Sit to Stand with OH reach (From 23 Mat Table)   3 x 10 - 20 with 2 Kg MB   Neuromuscular Re-education:  Obstacle Clearance with 5 Hurdles Forward direction 4 x  - Step to pattern, one instance of LOB requiring UE support   Side Ways Direction 4 x  - two instances of foot catching requiring UE Support   Single Leg Stance   R/L: 3 x 30s   Tandem Stepping Practice (5 min)   - patient able to achieve 5 consective steps without PT assistance   - intermittent physical assistance from PT to maintain balance     PATIENT EDUCATION:  Education details: Exercise Technique  Person educated: Patient Education method: Explanation, Demonstration, and Handouts Education comprehension: verbalized understanding and returned demonstration  HOME EXERCISE PROGRAM:  Access Code: [email or web address] URL: [email or web address] Date: 03/06/2024 Prepared by: Lonni Pall  Exercises - Standing March with Counter Support  - 1 x daily - 3-4 x weekly - 2-3 sets - 10-12 reps - Feet Together Balance at [employer] Eyes Closed  - 1 x daily - 7 x weekly - 3 sets - 30 hold - Sit to Stand with Arms Crossed  - 1 x daily - 7 x weekly - 3 sets - 10 reps - Semi-Tandem Balance at [employer] Eyes Open  - 1 x daily - 7 x weekly - 5 sets - 10 hold - Tandem Walking with Counter Support  - 1 x daily - 7 x weekly - 3 sets - 10 reps - Mini Squat with Counter Support  - 1 x daily - 3-4 x weekly - 2-3 sets - 10 reps  Access Code: [email or web address] URL: [email or web address] Date: 01/24/2024 Prepared by: Lonni Pall  Exercises - Standing March with Counter Support  - 1 x daily - 3-4 x weekly - 2-3 sets - 10-12 reps - Feet Together Balance at [employer] Eyes Closed  - 1 x  daily - 7 x weekly - 3 sets - 30 hold - Sit to Stand with Armchair  - 1 x daily - 3-4 x weekly - 2-3 sets - 8-10 reps  ASSESSMENT:  CLINICAL IMPRESSION: Continued PT POC in management of  unsteadiness and L knee pain. Session limited due to high blood pressure reading and late arrival. Dynamic balance tolerated in today's session; focused on functional transfers and narrow BoS. Patient able to clear 6 small hurdles however prolonged L SLS with minor LOB.  Intermittent seated rest breaks provided to mitigate fatigue. Patient still presents with deficits in LE strength and balance with uneven surfaces. This affects her full participation with prolonged walking, recreational activities at the beach and family. Blood Pressure monitored throughout session; VSS, Systolic blood pressure trending towards baseline at end of session. Based on today's performance, patient will still benefit from skilled physical therapy in order to maximize decreased fall risk for safe discharge and improved QoL.   OBJECTIVE IMPAIRMENTS: Abnormal gait, decreased activity tolerance, decreased balance, decreased endurance, difficulty walking, decreased ROM, decreased strength, impaired sensation, and pain.   ACTIVITY LIMITATIONS: carrying, lifting, standing, squatting, and stairs  PARTICIPATION LIMITATIONS: shopping, community activity, and yard work  PERSONAL FACTORS: Age, Past/current experiences, Profession, Sex, Time since onset of injury/illness/exacerbation, and 1-2 comorbidities: HTN, Hx of TIA are also affecting patient's functional outcome.   REHAB POTENTIAL: Fair    CLINICAL DECISION MAKING: Evolving/moderate complexity  EVALUATION COMPLEXITY: Moderate   GOALS: Goals reviewed with patient? Yes  SHORT TERM GOALS: Target date: 03/06/2024  Pt will be independent with HEP to improve strength and decrease knee pain to improve pain-free function at home and work. Baseline: 01/24/2024: Initial HEP provided; 03/06/2024: Pt endorsed 65% adherence Goal status: Progressing   LONG TERM GOALS: Target date: 05/01/2024   Pt will improve ABC by at least 13% in order to demonstrate clinically significant  improvement in balance confidence. Baseline: 01/24/2024: ABC score: 1190 / 1600 = 74.4 %;  03/06/2024: 85.62% Goal status: Progressing  2.  Pt will decrease worst knee pain by at least 3 points on the NPRS in order to demonstrate clinically significant reduction in knee pain. Baseline: 01/24/2024: 09/10 NPS; 02/28/2024: 10/10 NPS; 03/27/2024:  Goal status: Progressing   3.  Pt will increase LEFS score by at least 9 points in order demonstrate clinically significant reduction in knee pain/disability.    Baseline: 01/24/2024: 48/80 (80/80 = no disability); 03/06/2024: 63/80 (80/80 = no disability) Goal status: Goal Met   4.  Pt will increase strength of knee flexion/extension by at least 1/2 MMT grade in order to demonstrate improvement in strength and function  Baseline: 01/24/2024: R/L: 4- knee Flexion, R/L 4/5 Knee Extension; 03/06/2024: R/L: 4+/4+ knee flexion, R/L: 4+/4+ Knee extension Goal status: Progressing  5.  Pt will increase by at least 0.13 m/s in order to demonstrate clinically significant improvement in community ambulation.  Baseline: 01/24/2024: 19.77s without AD; 03/02/2024: .87 m/s without AD; 03/06/2024: 1.02 m/s Goal status: Goal met  6.  Pt will decrease 5TSTS by at least 3 seconds in order to demonstrate clinically significant improvement in LE strength and functional ability.  Baseline: 01/24/2024: 15.88s; 03/06/2024: 9.85 m/s Goal status: Goal Met  7.  Pt will increase 30s STS repetitions by 5 reps in order to demonstrate improvements in LE strength and endurance.  Baseline: 03/06/2024: 17 reps  Goal status: Initial   8.  The patient will improve their Functional Gait Assessment (FGA) score from to >=22/30  to reduce fall risk and improve community mobility. Baseline: 03/06/2024: 20/30 ( <22/30 - high fall risk)   Goal status: Initial   PLAN:  PT FREQUENCY: 1-2x/week  PT DURATION: 12 weeks  PLANNED INTERVENTIONS: 97110-Therapeutic exercises, 97530-  Therapeutic activity, 97112- Neuromuscular re-education, 97535- Self Care, [id number]- Manual therapy, [phone]- Gait training, Patient/Family education, Balance training, Stair training, Cryotherapy, and Moist heat  PLAN FOR NEXT SESSION: Progressing Gait Training with AD, Balance Training, LE strengthening, Reactional Training   Lonni Pall PT, DPT Physical Therapist- [place]  03/29/2024, 3:37 PM  "

## 2024-04-04 ENCOUNTER — Ambulatory Visit: Attending: Orthopedic Surgery

## 2024-04-04 DIAGNOSIS — M25561 Pain in right knee: Secondary | ICD-10-CM | POA: Insufficient documentation

## 2024-04-04 DIAGNOSIS — G8929 Other chronic pain: Secondary | ICD-10-CM | POA: Insufficient documentation

## 2024-04-04 DIAGNOSIS — M25562 Pain in left knee: Secondary | ICD-10-CM | POA: Insufficient documentation

## 2024-04-04 DIAGNOSIS — M6281 Muscle weakness (generalized): Secondary | ICD-10-CM | POA: Diagnosis present

## 2024-04-04 DIAGNOSIS — R2681 Unsteadiness on feet: Secondary | ICD-10-CM | POA: Diagnosis present

## 2024-04-04 NOTE — Therapy (Signed)
 " OUTPATIENT PHYSICAL THERAPY KNEE/BALANCE TREATMENT   Patient Name: Kiara [name] MRN: [id number] DOB:June 17, 1934, 89 y.o., female Today's Date: 04/04/2024  END OF SESSION:  PT End of Session - 04/04/24 1304     Visit Number 14    Number of Visits 17    Date for Recertification  05/01/24    Authorization Type auth 26 visits  10/27 - 05/27/24  Authorization #[id number]    Authorization Time Period 10/27 - 05/27/24    Authorization - Number of Visits 26    Progress Note Due on Visit 20    PT Start Time 1403    PT Stop Time 1445    PT Time Calculation (min) 42 min    Activity Tolerance Patient tolerated treatment well    Behavior During Therapy WFL for tasks assessed/performed                Past Medical History:  Diagnosis Date   Arthritis    osteoarthritis -right knee.   C. difficile colitis    past,no recent issues   Colitis    Diverticulitis    Fall    tripped-10 days ago-scrapped right knee and lateral right brow area- healing   GERD (gastroesophageal reflux disease)    occ.   Seasonal allergies    Past Surgical History:  Procedure Laterality Date   APPENDECTOMY     CATARACT EXTRACTION, BILATERAL     EYE SURGERY     ? right eye retina repair.   HEMORRHOID SURGERY     KNEE ARTHROSCOPY Right 11/26/2014   Procedure: ARTHROSCOPY KNEE, Tear medical horn and anterior horn. Lateral mesicus tear, grade 3 medial;  Surgeon: Lynwood SHAUNNA Hue, MD;  Location: [facility];  Service: Orthopedics;  Laterality: Right;   MOLE REMOVAL     benign   TONSILLECTOMY     TOTAL KNEE ARTHROPLASTY Right 02/12/2016   Procedure: RIGHT TOTAL KNEE ARTHROPLASTY;  Surgeon: Dempsey Moan, MD;  Location: [facility];  Service: Orthopedics;  Laterality: Right;   Patient Active Problem List   Diagnosis Date Noted   Mild cognitive impairment 02/02/2022   TIA (transient ischemic attack) 08/08/2020   Degenerative lumbar spinal stenosis 06/29/2019   Pulmonary nodules/lesions, multiple 02/08/2019    Exudative age-related macular degeneration of left eye with active choroidal neovascularization (HCC) 01/20/2019   Idiopathic peripheral neuropathy 01/20/2019   Radial styloid tenosynovitis 03/28/2018   Pain in right knee 02/23/2018   History of Clostridium difficile colitis 12/16/2017   Irritable bowel syndrome without diarrhea 12/16/2017   Cataract cortical, senile 08/03/2017   Chronic colitis 08/03/2017   Medicare annual wellness visit, initial 12/15/2016   Benign essential hypertension 11/04/2016   OA (osteoarthritis) of knee 02/12/2016   Hyperlipidemia, mixed 10/24/2015   Clostridium difficile colitis 01/02/2014   Right bundle branch block 10/18/2013    PCP: Cleotilde Oneil FALCON, MD  REFERRING PROVIDER: Marchia Drivers, MD  REFERRING DIAG: M17.0 (ICD-10-CM) - Primary osteoarthritis of both knees   RATIONALE FOR EVALUATION AND TREATMENT: Rehabilitation  THERAPY DIAG: Unsteadiness on feet  Muscle weakness (generalized)  Chronic pain of both knees  ONSET DATE: Chronic     FOLLOW-UP APPT SCHEDULED WITH REFERRING PROVIDER: No    SUBJECTIVE:  SUBJECTIVE STATEMENT:    Patient is an 89 year old female presenting with unsteadiness and R/L knee pain.   PERTINENT HISTORY:   Kiara [name] is an 89 year old female presenting to OPPT with a chief concern of balance deficits and bilateral knee pain. She has a history of TIA, falls and lumbar spinal stenosis and R TKA. Patient received corticosteroid injection the L knee. History of balance deficits primarily due to baseline peripheral neuropathy in bilateral feet contributing to difficulty walking. She reports that her balance has worsened within the last month. Patient currently walking with hurry cane in the community. She reports that she drifts to one  side (L > R) while walking. Patient reports that she will have directed radiation towards the L knee performed by her oncologist.   R Hand Dominant.   Imaging: No   PAIN:   Pain Intensity: Present: 0/10, Best: 0/10, Worst: 9/10 Pain location: bilateral knees  Pain quality: intermittent and sharp  Radiating pain: No  Swelling: No  Numbness/Tingling: Yes Focal weakness or buckling: No Aggravating factors: Walking Relieving factors: Sitting   PRECAUTIONS: Fall  WEIGHT BEARING RESTRICTIONS: No  FALLS: Has patient fallen in last 6 months? Yes. Number of falls 1  Living Environment Lives with: lives alone Lives in: House/apartment Stairs: No Has following equipment at home: Vannie - 4 wheeled and hurry cane  Prior level of function: Independent  Occupational demands: Retired  Presenter, Broadcasting: Taking care of her dog, walking   Patient Goals: I would like to be able to walk without assistance    OBJECTIVE:   PATIENT SURVEYS:  LEFS: 48/80 (80/80 = no disability) ABC: 74/100% - Moderate confidence    Cognition Patient is oriented to person, place, and time.  Recent memory is intact.  Accompanied by daughter: [name]   Gross Musculoskeletal Assessment Tremor: None Bulk: Normal Tone: Normal  GAIT: Distance walked: 57m  Assistive device utilized: hurry cane Level of assistance: Modified independence Comments: R hand hurry cane, reciprocal gait   Posture:  AROM AROM (Normal range in degrees) AROM  Hip Right Left  Flexion (125)    Extension (15)    Abduction (40)    Adduction     Internal Rotation (45)    External Rotation (45)        Knee    Flexion (135)    Extension (0)        Ankle    Dorsiflexion (20)    Plantarflexion (50)    Inversion (35)    Eversion (15    (* = pain; Blank rows = not tested)  LE MMT: MMT (out of 5) Right Left  Hip flexion 4- 4-  Hip extension    Hip abduction    Hip adduction    Hip internal rotation    Hip external  rotation    Knee flexion 4- 4-  Knee extension 4 4  Ankle dorsiflexion 5 5  Ankle plantarflexion 5 5  Ankle inversion    Ankle eversion    (* = pain; Blank rows = not tested)  Sensation Grossly intact to light touch bilateral LEs as determined by testing dermatomes L2-S2. Proprioception, and hot/cold testing deferred on this date.  Reflexes R/L Knee Jerk (L3/4): 2+/1+  Ankle Jerk (S1/2): 2+/1+  Muscle Length Not tested  Palpation Location LEFT  RIGHT           Quadriceps 0 0  Medial Hamstrings 0 0  Lateral Hamstrings 0 0  Lateral Hamstring tendon  Medial Hamstring tendon    Quadriceps tendon 0 0  Patella    Patellar Tendon    Tibial Tuberosity    Medial joint line    Lateral joint line    MCL    LCL    Adductor Tubercle    Pes Anserine tendon    Infrapatellar fat pad    Fibular head    Popliteal fossa    (Blank rows = not tested) Graded on 0-4 scale (0 = no pain, 1 = pain, 2 = pain with wincing/grimacing/flinching, 3 = pain with withdrawal, 4 = unwilling to allow palpation), (Blank rows = not tested)  Passive Accessory Motion Deferred  Functional Test:  : 19.77 5TSTS: 15.88s  TODAY'S TREATMENT: DATE: 04/04/2024  Precaution per MD 10/31: BP should be under 150/90 and above 100/60 in resting position. Systolic of 180-190 with sx prompt emergency care.   Subjective: Patient reports 2/10 in the L knee. No near falls  No further questions or concerns.   Pre Exercise: Vitals (Seated, Left Brachial):   Blood Pressure: 116/67  HR: 87  O2: 100  Post Exercise:  Vitals (Seated, Left Brachial):   Blood Pressure: 17058  HR: 63  O2: 100     Therapeutic Activity: NuStep- Pace Partner mode -  L5-1 x 5 min x UE/LE (Seat 8) to improve LE endurance and strength for increased walking capacity; PT manually adjusted resistance per patient tolerance.    Forward Marching - With Tidal Tank (20#)   33m - no major LOB - SBA   Sit to Stand from elevated mat table  (Seated rest break in b/t sets)  3 x 10 - with OH reach (3Kg MB)   Forward Step Up - 11 Step - BUE support for increased LE strength and stair capacity  (Standing rest break in b/t sets)   R/L: 2 x 10   Neuromuscular Re-education:  Agility Ladder - SBA - Seated Rest break in b/t   Side Stepping 2x   Side Stepping against blaze pods x3 - 30s per bout - Random protocol - RLE  Deactivation    Crossover Stepping - 53ft   R over L Direction x 3   LLE over RLE x 3    PATIENT EDUCATION:  Education details: Exercise Technique  Person educated: Patient Education method: Explanation, Demonstration, and Handouts Education comprehension: verbalized understanding and returned demonstration  HOME EXERCISE PROGRAM:  Access Code: [email or web address] URL: [email or web address] Date: 03/06/2024 Prepared by: Lonni Pall  Exercises - Standing March with Counter Support  - 1 x daily - 3-4 x weekly - 2-3 sets - 10-12 reps - Feet Together Balance at [employer] Eyes Closed  - 1 x daily - 7 x weekly - 3 sets - 30 hold - Sit to Stand with Arms Crossed  - 1 x daily - 7 x weekly - 3 sets - 10 reps - Semi-Tandem Balance at [employer] Eyes Open  - 1 x daily - 7 x weekly - 5 sets - 10 hold - Tandem Walking with Counter Support  - 1 x daily - 7 x weekly - 3 sets - 10 reps - Mini Squat with Counter Support  - 1 x daily - 3-4 x weekly - 2-3 sets - 10 reps  Access Code: [email or web address] URL: [email or web address] Date: 01/24/2024 Prepared by: Lonni Pall  Exercises - Standing March with Counter Support  - 1 x daily - 3-4 x weekly - 2-3 sets - 10-12 reps - Feet Together Balance at [employer]  [employer] Eyes Closed  - 1 x daily - 7 x weekly - 3 sets - 30 hold - Sit to Stand with Armchair  - 1 x daily - 3-4 x weekly - 2-3 sets - 8-10 reps  ASSESSMENT:  CLINICAL IMPRESSION: Continued PT POC in management of unsteadiness and L knee pain. PT challenged patient's dynamic balance with cross over  stepping and side stepping against obstacle clearance. Good ability to clear agility ladder and deactivate blazepods with RLE at self selected speeds. Minor unsteadiness and intermittent bouts of stepping reactions when prompted to increase speed with side stepping. Patient continues to improve with functional strength for transfers and steps; she tolerated decreased mat level height and increased step height for quadricep strength. Vital signs stable (see above) throughout session. Seated rest breaks provided throughout session in order to mitigate fatigue.   Based on today's performance, patient will still benefit from skilled physical therapy in order to maximize decreased fall risk for safe discharge and improved QoL.   OBJECTIVE IMPAIRMENTS: Abnormal gait, decreased activity tolerance, decreased balance, decreased endurance, difficulty walking, decreased ROM, decreased strength, impaired sensation, and pain.   ACTIVITY LIMITATIONS: carrying, lifting, standing, squatting, and stairs  PARTICIPATION LIMITATIONS: shopping, community activity, and yard work  PERSONAL FACTORS: Age, Past/current experiences, Profession, Sex, Time since onset of injury/illness/exacerbation, and 1-2 comorbidities: HTN, Hx of TIA are also affecting patient's functional outcome.   REHAB POTENTIAL: Fair    CLINICAL DECISION MAKING: Evolving/moderate complexity  EVALUATION COMPLEXITY: Moderate   GOALS: Goals reviewed with patient? Yes  SHORT TERM GOALS: Target date: 03/06/2024  Pt will be independent with HEP to improve strength and decrease knee pain to improve pain-free function at home and work. Baseline: 01/24/2024: Initial HEP provided; 03/06/2024: Pt endorsed 65% adherence Goal status: Progressing   LONG TERM GOALS: Target date: 05/01/2024   Pt will improve ABC by at least 13% in order to demonstrate clinically significant improvement in balance confidence. Baseline: 01/24/2024: ABC score: 1190 / 1600 =  74.4 %;  03/06/2024: 85.62% Goal status: Progressing  2.  Pt will decrease worst knee pain by at least 3 points on the NPRS in order to demonstrate clinically significant reduction in knee pain. Baseline: 01/24/2024: 09/10 NPS; 02/28/2024: 10/10 NPS; 03/27/2024:  Goal status: Progressing   3.  Pt will increase LEFS score by at least 9 points in order demonstrate clinically significant reduction in knee pain/disability.    Baseline: 01/24/2024: 48/80 (80/80 = no disability); 03/06/2024: 63/80 (80/80 = no disability) Goal status: Goal Met   4.  Pt will increase strength of knee flexion/extension by at least 1/2 MMT grade in order to demonstrate improvement in strength and function  Baseline: 01/24/2024: R/L: 4- knee Flexion, R/L 4/5 Knee Extension; 03/06/2024: R/L: 4+/4+ knee flexion, R/L: 4+/4+ Knee extension Goal status: Progressing  5.  Pt will increase by at least 0.13 m/s in order to demonstrate clinically significant improvement in community ambulation.  Baseline: 01/24/2024: 19.77s without AD; 03/02/2024: .87 m/s without AD; 03/06/2024: 1.02 m/s Goal status: Goal met  6.  Pt will decrease 5TSTS by at least 3 seconds in order to demonstrate clinically significant improvement in LE strength and functional ability.  Baseline: 01/24/2024: 15.88s; 03/06/2024: 9.85 m/s Goal status: Goal Met  7.  Pt will increase 30s STS repetitions by 5 reps in order to demonstrate improvements in LE strength and endurance.  Baseline: 03/06/2024: 17 reps  Goal status: Initial   8.  The patient will improve their Functional Gait  Assessment (FGA) score from to >=22/30 to reduce fall risk and improve community mobility. Baseline: 03/06/2024: 20/30 ( <22/30 - high fall risk)   Goal status: Initial   PLAN:  PT FREQUENCY: 1-2x/week  PT DURATION: 12 weeks  PLANNED INTERVENTIONS: 97110-Therapeutic exercises, 97530- Therapeutic activity, 97112- Neuromuscular re-education, 97535- Self Care, [id number]-  Manual therapy, [phone]- Gait training, Patient/Family education, Balance training, Stair training, Cryotherapy, and Moist heat  PLAN FOR NEXT SESSION: Progressing Gait Training with AD, Balance Training, LE strengthening, Reactional Training   Lonni Pall PT, DPT Physical Therapist- [place]  04/04/2024, 1:10 PM  "

## 2024-04-06 ENCOUNTER — Ambulatory Visit

## 2024-04-06 DIAGNOSIS — M6281 Muscle weakness (generalized): Secondary | ICD-10-CM

## 2024-04-06 DIAGNOSIS — R2681 Unsteadiness on feet: Secondary | ICD-10-CM | POA: Diagnosis not present

## 2024-04-06 DIAGNOSIS — G8929 Other chronic pain: Secondary | ICD-10-CM

## 2024-04-06 NOTE — Therapy (Signed)
 " OUTPATIENT PHYSICAL THERAPY KNEE/BALANCE TREATMENT   Patient Name: Kiara [name] MRN: [id number] DOB:1935/03/17, 89 y.o., female Today's Date: 04/06/2024  END OF SESSION:  PT End of Session - 04/06/24 1429     Visit Number 15    Number of Visits 25    Date for Recertification  05/01/24    Authorization Type auth 26 visits  10/27 - 05/27/24  Authorization #[id number]    Authorization Time Period 10/27 - 05/27/24    Authorization - Visit Number 15    Authorization - Number of Visits 26    Progress Note Due on Visit 20    PT Start Time 1430    PT Stop Time 1510    PT Time Calculation (min) 40 min    Activity Tolerance Patient tolerated treatment well    Behavior During Therapy WFL for tasks assessed/performed         Past Medical History:  Diagnosis Date   Arthritis    osteoarthritis -right knee.   C. difficile colitis    past,no recent issues   Colitis    Diverticulitis    Fall    tripped-10 days ago-scrapped right knee and lateral right brow area- healing   GERD (gastroesophageal reflux disease)    occ.   Seasonal allergies    Past Surgical History:  Procedure Laterality Date   APPENDECTOMY     CATARACT EXTRACTION, BILATERAL     EYE SURGERY     ? right eye retina repair.   HEMORRHOID SURGERY     KNEE ARTHROSCOPY Right 11/26/2014   Procedure: ARTHROSCOPY KNEE, Tear medical horn and anterior horn. Lateral mesicus tear, grade 3 medial;  Surgeon: Lynwood SHAUNNA Hue, MD;  Location: [facility];  Service: Orthopedics;  Laterality: Right;   MOLE REMOVAL     benign   TONSILLECTOMY     TOTAL KNEE ARTHROPLASTY Right 02/12/2016   Procedure: RIGHT TOTAL KNEE ARTHROPLASTY;  Surgeon: Dempsey Moan, MD;  Location: [facility];  Service: Orthopedics;  Laterality: Right;   Patient Active Problem List   Diagnosis Date Noted   Mild cognitive impairment 02/02/2022   TIA (transient ischemic attack) 08/08/2020   Degenerative lumbar spinal stenosis 06/29/2019   Pulmonary nodules/lesions,  multiple 02/08/2019   Exudative age-related macular degeneration of left eye with active choroidal neovascularization (HCC) 01/20/2019   Idiopathic peripheral neuropathy 01/20/2019   Radial styloid tenosynovitis 03/28/2018   Pain in right knee 02/23/2018   History of Clostridium difficile colitis 12/16/2017   Irritable bowel syndrome without diarrhea 12/16/2017   Cataract cortical, senile 08/03/2017   Chronic colitis 08/03/2017   Medicare annual wellness visit, initial 12/15/2016   Benign essential hypertension 11/04/2016   OA (osteoarthritis) of knee 02/12/2016   Hyperlipidemia, mixed 10/24/2015   Clostridium difficile colitis 01/02/2014   Right bundle branch block 10/18/2013    PCP: Cleotilde Oneil FALCON, MD  REFERRING PROVIDER: Marchia Drivers, MD  REFERRING DIAG: M17.0 (ICD-10-CM) - Primary osteoarthritis of both knees   RATIONALE FOR EVALUATION AND TREATMENT: Rehabilitation  THERAPY DIAG: Unsteadiness on feet  Muscle weakness (generalized)  Chronic pain of both knees  ONSET DATE: Chronic     FOLLOW-UP APPT SCHEDULED WITH REFERRING PROVIDER: No    SUBJECTIVE:  SUBJECTIVE STATEMENT:    Patient is an 89 year old female presenting with unsteadiness and R/L knee pain.   PERTINENT HISTORY:   Kiara [name] is an 89 year old female presenting to OPPT with a chief concern of balance deficits and bilateral knee pain. She has a history of TIA, falls and lumbar spinal stenosis and R TKA. Patient received corticosteroid injection the L knee. History of balance deficits primarily due to baseline peripheral neuropathy in bilateral feet contributing to difficulty walking. She reports that her balance has worsened within the last month. Patient currently walking with hurry cane in the community. She reports  that she drifts to one side (L > R) while walking. Patient reports that she will have directed radiation towards the L knee performed by her oncologist.   R Hand Dominant.   Imaging: No   PAIN:   Pain Intensity: Present: 0/10, Best: 0/10, Worst: 9/10 Pain location: bilateral knees  Pain quality: intermittent and sharp  Radiating pain: No  Swelling: No  Numbness/Tingling: Yes Focal weakness or buckling: No Aggravating factors: Walking Relieving factors: Sitting   PRECAUTIONS: Fall  WEIGHT BEARING RESTRICTIONS: No  FALLS: Has patient fallen in last 6 months? Yes. Number of falls 1  Living Environment Lives with: lives alone Lives in: House/apartment Stairs: No Has following equipment at home: Vannie - 4 wheeled and hurry cane  Prior level of function: Independent  Occupational demands: Retired  Presenter, Broadcasting: Taking care of her dog, walking   Patient Goals: I would like to be able to walk without assistance    OBJECTIVE:   PATIENT SURVEYS:  LEFS: 48/80 (80/80 = no disability) ABC: 74/100% - Moderate confidence    Cognition Patient is oriented to person, place, and time.  Recent memory is intact.  Accompanied by daughter: [name]   Gross Musculoskeletal Assessment Tremor: None Bulk: Normal Tone: Normal  GAIT: Distance walked: 55m  Assistive device utilized: hurry cane Level of assistance: Modified independence Comments: R hand hurry cane, reciprocal gait   Posture:  AROM AROM (Normal range in degrees) AROM  Hip Right Left  Flexion (125)    Extension (15)    Abduction (40)    Adduction     Internal Rotation (45)    External Rotation (45)        Knee    Flexion (135)    Extension (0)        Ankle    Dorsiflexion (20)    Plantarflexion (50)    Inversion (35)    Eversion (15    (* = pain; Blank rows = not tested)  LE MMT: MMT (out of 5) Right Left  Hip flexion 4- 4-  Hip extension    Hip abduction    Hip adduction    Hip internal rotation     Hip external rotation    Knee flexion 4- 4-  Knee extension 4 4  Ankle dorsiflexion 5 5  Ankle plantarflexion 5 5  Ankle inversion    Ankle eversion    (* = pain; Blank rows = not tested)  Sensation Grossly intact to light touch bilateral LEs as determined by testing dermatomes L2-S2. Proprioception, and hot/cold testing deferred on this date.  Reflexes R/L Knee Jerk (L3/4): 2+/1+  Ankle Jerk (S1/2): 2+/1+  Muscle Length Not tested  Palpation Location LEFT  RIGHT           Quadriceps 0 0  Medial Hamstrings 0 0  Lateral Hamstrings 0 0  Lateral Hamstring tendon  Medial Hamstring tendon    Quadriceps tendon 0 0  Patella    Patellar Tendon    Tibial Tuberosity    Medial joint line    Lateral joint line    MCL    LCL    Adductor Tubercle    Pes Anserine tendon    Infrapatellar fat pad    Fibular head    Popliteal fossa    (Blank rows = not tested) Graded on 0-4 scale (0 = no pain, 1 = pain, 2 = pain with wincing/grimacing/flinching, 3 = pain with withdrawal, 4 = unwilling to allow palpation), (Blank rows = not tested)  Passive Accessory Motion Deferred  Functional Test:  : 19.77 5TSTS: 15.88s  TODAY'S TREATMENT: DATE: 04/06/2024  Precaution per MD 10/31: BP should be under 150/90 and above 100/60 in resting position. Systolic of 180-190 with sx prompt emergency care.   Subjective: Patient reports 4/10 in the L knee. No near falls in the last week.  No further questions or concerns.   Pre Exercise: Vitals (Seated, Left Brachial):   Blood Pressure: 139/65  HR: 75   Therapeutic Activity: NuStep- Pace Partner mode -  L5-1 x 5 min x UE/LE (Seat 8) to improve LE endurance and strength for increased walking capacity; PT manually adjusted resistance per patient tolerance.    Sit to Stand with OH Reach onto toes (3 Kg MB)  3 x 10 - Seated rest break in b/t sets     Neuromuscular Re-education:  SLS For Time  R/L: 3 x 10s ea side - Close SBA   Hurdle  Clearance with Cone Squat and Reach   5x Small hurdles, 5 Cones randomly placed to elicit squatting and dynamic balance    Sideways x 1 trial - One instance of PT assistance to maintain balance when on L SLS    Forwards x  1 trial - no major LOB    - Patient to grab a cone and return back to first square  Therapeutic Exercise:  Seated Knee Extension  3 x 10 - 15#  Seated Hamstring Curl   1 x 10 - 15#   2 x 10 - 25#   PATIENT EDUCATION:  Education details: Exercise Technique  Person educated: Patient Education method: Explanation, Demonstration, and Handouts Education comprehension: verbalized understanding and returned demonstration  HOME EXERCISE PROGRAM:  Access Code: [email or web address] URL: [email or web address] Date: 03/06/2024 Prepared by: Lonni Pall  Exercises - Standing March with Counter Support  - 1 x daily - 3-4 x weekly - 2-3 sets - 10-12 reps - Feet Together Balance at [employer] Eyes Closed  - 1 x daily - 7 x weekly - 3 sets - 30 hold - Sit to Stand with Arms Crossed  - 1 x daily - 7 x weekly - 3 sets - 10 reps - Semi-Tandem Balance at [employer] Eyes Open  - 1 x daily - 7 x weekly - 5 sets - 10 hold - Tandem Walking with Counter Support  - 1 x daily - 7 x weekly - 3 sets - 10 reps - Mini Squat with Counter Support  - 1 x daily - 3-4 x weekly - 2-3 sets - 10 reps  Access Code: [email or web address] URL: [email or web address] Date: 01/24/2024 Prepared by: Lonni Pall  Exercises - Standing March with Counter Support  - 1 x daily - 3-4 x weekly - 2-3 sets - 10-12 reps - Feet Together Balance at [employer] Eyes Closed  - 1 x daily - 7  x weekly - 3 sets - 30 hold - Sit to Stand with Armchair  - 1 x daily - 3-4 x weekly - 2-3 sets - 8-10 reps  ASSESSMENT:  CLINICAL IMPRESSION: Continued PT POC in management of unsteadiness and L knee pain. Patient still presenting with minor unsteadiness with prolonged L SLS. Patient without major LOB during  hurdle clearance or squatting to reach for cone, however SBA provided in order for safety. One instance of stepping reaching during forefoot stance for upward reaching but she was able to maintain balance without PT assistance. Vital Signs stable throughout session. Knee pain improved with light resistance for quadriceps and hamstring muscle group. Based on today's performance, patient will still benefit from skilled physical therapy in order to maximize decreased fall risk for safe discharge and improved QoL.   OBJECTIVE IMPAIRMENTS: Abnormal gait, decreased activity tolerance, decreased balance, decreased endurance, difficulty walking, decreased ROM, decreased strength, impaired sensation, and pain.   ACTIVITY LIMITATIONS: carrying, lifting, standing, squatting, and stairs  PARTICIPATION LIMITATIONS: shopping, community activity, and yard work  PERSONAL FACTORS: Age, Past/current experiences, Profession, Sex, Time since onset of injury/illness/exacerbation, and 1-2 comorbidities: HTN, Hx of TIA are also affecting patient's functional outcome.   REHAB POTENTIAL: Fair    CLINICAL DECISION MAKING: Evolving/moderate complexity  EVALUATION COMPLEXITY: Moderate   GOALS: Goals reviewed with patient? Yes  SHORT TERM GOALS: Target date: 03/06/2024  Pt will be independent with HEP to improve strength and decrease knee pain to improve pain-free function at home and work. Baseline: 01/24/2024: Initial HEP provided; 03/06/2024: Pt endorsed 65% adherence Goal status: Progressing   LONG TERM GOALS: Target date: 05/01/2024   Pt will improve ABC by at least 13% in order to demonstrate clinically significant improvement in balance confidence. Baseline: 01/24/2024: ABC score: 1190 / 1600 = 74.4 %;  03/06/2024: 85.62% Goal status: Progressing  2.  Pt will decrease worst knee pain by at least 3 points on the NPRS in order to demonstrate clinically significant reduction in knee pain. Baseline:  01/24/2024: 09/10 NPS; 02/28/2024: 10/10 NPS; 03/27/2024:  Goal status: Progressing   3.  Pt will increase LEFS score by at least 9 points in order demonstrate clinically significant reduction in knee pain/disability.    Baseline: 01/24/2024: 48/80 (80/80 = no disability); 03/06/2024: 63/80 (80/80 = no disability) Goal status: Goal Met   4.  Pt will increase strength of knee flexion/extension by at least 1/2 MMT grade in order to demonstrate improvement in strength and function  Baseline: 01/24/2024: R/L: 4- knee Flexion, R/L 4/5 Knee Extension; 03/06/2024: R/L: 4+/4+ knee flexion, R/L: 4+/4+ Knee extension Goal status: Progressing  5.  Pt will increase by at least 0.13 m/s in order to demonstrate clinically significant improvement in community ambulation.  Baseline: 01/24/2024: 19.77s without AD; 03/02/2024: .87 m/s without AD; 03/06/2024: 1.02 m/s Goal status: Goal met  6.  Pt will decrease 5TSTS by at least 3 seconds in order to demonstrate clinically significant improvement in LE strength and functional ability.  Baseline: 01/24/2024: 15.88s; 03/06/2024: 9.85 m/s Goal status: Goal Met  7.  Pt will increase 30s STS repetitions by 5 reps in order to demonstrate improvements in LE strength and endurance.  Baseline: 03/06/2024: 17 reps  Goal status: Initial   8.  The patient will improve their Functional Gait Assessment (FGA) score from to >=22/30 to reduce fall risk and improve community mobility. Baseline: 03/06/2024: 20/30 ( <22/30 - high fall risk)   Goal status: Initial   PLAN:  PT FREQUENCY: 1-2x/week  PT DURATION: 12 weeks  PLANNED INTERVENTIONS: 97110-Therapeutic exercises, 97530- Therapeutic activity, 97112- Neuromuscular re-education, 97535- Self Care, [id number]- Manual therapy, [phone]- Gait training, Patient/Family education, Balance training, Stair training, Cryotherapy, and Moist heat  PLAN FOR NEXT SESSION: Progressing Gait Training with AD, Balance Training, LE  strengthening, Reactional Training   Lonni Pall PT, DPT Physical Therapist- [place]  04/06/2024, 2:32 PM  "

## 2024-04-11 ENCOUNTER — Ambulatory Visit

## 2024-04-11 DIAGNOSIS — M6281 Muscle weakness (generalized): Secondary | ICD-10-CM

## 2024-04-11 DIAGNOSIS — R2681 Unsteadiness on feet: Secondary | ICD-10-CM

## 2024-04-11 DIAGNOSIS — G8929 Other chronic pain: Secondary | ICD-10-CM

## 2024-04-11 NOTE — Therapy (Signed)
 " OUTPATIENT PHYSICAL THERAPY KNEE/BALANCE TREATMENT   Patient Name: Kiara [name] MRN: [id number] DOB:[birth year], 89 y.o., female Today's Date: 04/11/2024  END OF SESSION:  PT End of Session - 04/11/24 1306     Visit Number 16    Number of Visits 25    Date for Recertification  05/01/24    Authorization Type auth 26 visits  10/27 - 05/27/24  Authorization #[id number]    Authorization Time Period 10/27 - 05/27/24    Authorization - Visit Number 16    Authorization - Number of Visits 26    Progress Note Due on Visit 20    PT Start Time 1304    PT Stop Time 1400    PT Time Calculation (min) 56 min    Activity Tolerance Patient tolerated treatment well    Behavior During Therapy WFL for tasks assessed/performed         Past Medical History:  Diagnosis Date   Arthritis    osteoarthritis -right knee.   C. difficile colitis    past,no recent issues   Colitis    Diverticulitis    Fall    tripped-10 days ago-scrapped right knee and lateral right brow area- healing   GERD (gastroesophageal reflux disease)    occ.   Seasonal allergies    Past Surgical History:  Procedure Laterality Date   APPENDECTOMY     CATARACT EXTRACTION, BILATERAL     EYE SURGERY     ? right eye retina repair.   HEMORRHOID SURGERY     KNEE ARTHROSCOPY Right 11/26/2014   Procedure: ARTHROSCOPY KNEE, Tear medical horn and anterior horn. Lateral mesicus tear, grade 3 medial;  Surgeon: Lynwood SHAUNNA Hue, MD;  Location: [facility];  Service: Orthopedics;  Laterality: Right;   MOLE REMOVAL     benign   TONSILLECTOMY     TOTAL KNEE ARTHROPLASTY Right 02/12/2016   Procedure: RIGHT TOTAL KNEE ARTHROPLASTY;  Surgeon: Dempsey Moan, MD;  Location: [facility];  Service: Orthopedics;  Laterality: Right;   Patient Active Problem List   Diagnosis Date Noted   Mild cognitive impairment 02/02/2022   TIA (transient ischemic attack) 08/08/2020   Degenerative lumbar spinal stenosis 06/29/2019   Pulmonary nodules/lesions,  multiple 02/08/2019   Exudative age-related macular degeneration of left eye with active choroidal neovascularization (HCC) 01/20/2019   Idiopathic peripheral neuropathy 01/20/2019   Radial styloid tenosynovitis 03/28/2018   Pain in right knee 02/23/2018   History of Clostridium difficile colitis 12/16/2017   Irritable bowel syndrome without diarrhea 12/16/2017   Cataract cortical, senile 08/03/2017   Chronic colitis 08/03/2017   Medicare annual wellness visit, initial 12/15/2016   Benign essential hypertension 11/04/2016   OA (osteoarthritis) of knee 02/12/2016   Hyperlipidemia, mixed 10/24/2015   Clostridium difficile colitis 01/02/2014   Right bundle branch block 10/18/2013    PCP: Cleotilde Oneil FALCON, MD  REFERRING PROVIDER: Marchia Drivers, MD  REFERRING DIAG: M17.0 (ICD-10-CM) - Primary osteoarthritis of both knees   RATIONALE FOR EVALUATION AND TREATMENT: Rehabilitation  THERAPY DIAG: Unsteadiness on feet  Muscle weakness (generalized)  Chronic pain of both knees  ONSET DATE: Chronic     FOLLOW-UP APPT SCHEDULED WITH REFERRING PROVIDER: No    SUBJECTIVE:  SUBJECTIVE STATEMENT:    Patient is an 89 year old female presenting with unsteadiness and R/L knee pain.   PERTINENT HISTORY:   Kiara [name] is an 89 year old female presenting to OPPT with a chief concern of balance deficits and bilateral knee pain. She has a history of TIA, falls and lumbar spinal stenosis and R TKA. Patient received corticosteroid injection the L knee. History of balance deficits primarily due to baseline peripheral neuropathy in bilateral feet contributing to difficulty walking. She reports that her balance has worsened within the last month. Patient currently walking with hurry cane in the community. She reports  that she drifts to one side (L > R) while walking. Patient reports that she will have directed radiation towards the L knee performed by her oncologist.   R Hand Dominant.   Imaging: No   PAIN:   Pain Intensity: Present: 0/10, Best: 0/10, Worst: 9/10 Pain location: bilateral knees  Pain quality: intermittent and sharp  Radiating pain: No  Swelling: No  Numbness/Tingling: Yes Focal weakness or buckling: No Aggravating factors: Walking Relieving factors: Sitting   PRECAUTIONS: Fall  WEIGHT BEARING RESTRICTIONS: No  FALLS: Has patient fallen in last 6 months? Yes. Number of falls 1  Living Environment Lives with: lives alone Lives in: House/apartment Stairs: No Has following equipment at home: Vannie - 4 wheeled and hurry cane  Prior level of function: Independent  Occupational demands: Retired  Presenter, Broadcasting: Taking care of her dog, walking   Patient Goals: I would like to be able to walk without assistance    OBJECTIVE:   PATIENT SURVEYS:  LEFS: 48/80 (80/80 = no disability) ABC: 74/100% - Moderate confidence    Cognition Patient is oriented to person, place, and time.  Recent memory is intact.  Accompanied by daughter: [name]   Gross Musculoskeletal Assessment Tremor: None Bulk: Normal Tone: Normal  GAIT: Distance walked: 4m  Assistive device utilized: hurry cane Level of assistance: Modified independence Comments: R hand hurry cane, reciprocal gait   Posture:  AROM AROM (Normal range in degrees) AROM  Hip Right Left  Flexion (125)    Extension (15)    Abduction (40)    Adduction     Internal Rotation (45)    External Rotation (45)        Knee    Flexion (135)    Extension (0)        Ankle    Dorsiflexion (20)    Plantarflexion (50)    Inversion (35)    Eversion (15    (* = pain; Blank rows = not tested)  LE MMT: MMT (out of 5) Right Left  Hip flexion 4- 4-  Hip extension    Hip abduction    Hip adduction    Hip internal rotation     Hip external rotation    Knee flexion 4- 4-  Knee extension 4 4  Ankle dorsiflexion 5 5  Ankle plantarflexion 5 5  Ankle inversion    Ankle eversion    (* = pain; Blank rows = not tested)  Sensation Grossly intact to light touch bilateral LEs as determined by testing dermatomes L2-S2. Proprioception, and hot/cold testing deferred on this date.  Reflexes R/L Knee Jerk (L3/4): 2+/1+  Ankle Jerk (S1/2): 2+/1+  Muscle Length Not tested  Palpation Location LEFT  RIGHT           Quadriceps 0 0  Medial Hamstrings 0 0  Lateral Hamstrings 0 0  Lateral Hamstring tendon  Medial Hamstring tendon    Quadriceps tendon 0 0  Patella    Patellar Tendon    Tibial Tuberosity    Medial joint line    Lateral joint line    MCL    LCL    Adductor Tubercle    Pes Anserine tendon    Infrapatellar fat pad    Fibular head    Popliteal fossa    (Blank rows = not tested) Graded on 0-4 scale (0 = no pain, 1 = pain, 2 = pain with wincing/grimacing/flinching, 3 = pain with withdrawal, 4 = unwilling to allow palpation), (Blank rows = not tested)  Passive Accessory Motion Deferred  Functional Test:  : 19.77 5TSTS: 15.88s  TODAY'S TREATMENT: DATE: 04/11/2024  Precaution per MD 10/31: BP should be under 150/90 and above 100/60 in resting position. Systolic of 180-190 with sx prompt emergency care.   Subjective: Patient reports that she had bilateral pain in her lower legs last night. She contributes the pain to dehydration however it has had minimal improvements. 4/10 NPS in her L knee. No further questions or concerns.   Pre Exercise: Vitals (Seated, Left Brachial):   Blood Pressure: 150/67  HR: 75   Therapeutic Activity: NuStep- Pace Partner mode - L5-2 x 8 min x UE/LE (Seat 8) to improve LE endurance and strength for increased walking capacity; PT manually adjusted resistance per patient tolerance.    TRX Squat   3 x 10 - VC for wider BoS   Reverse Lunge - SUE Support    R/L in front: 3 x 5 reps   STS with Med Ball Press   1 x 10 - 3Kg MB  STS with Med Ball Throw > Seated Hinge and reach   2 x 10 - 3 Kg MB (Seated rest break)   Sled Push  2 x 88m - 70# sled  2 x 38m - 25# added to sled   2 x 72m - 50# added to sled   Therapeutic Exercise:  Seated Knee Extension  3 x 10 - 15#  Seated Hamstring Curl   3 x 10 - 25#   Supine 90/90 with Pallof Press for core stabilization   3 x 10 - Blue TB  Supine Gluteal Stretch   R/L: 30s/bout x 2 in order to improve muscle tension  Supine Lumbar Crossover Stretch R/L: 30s/bout x 2 in order to muscle tension  PATIENT EDUCATION:  Education details: Exercise Technique  Person educated: Patient Education method: Explanation, Demonstration, and Handouts Education comprehension: verbalized understanding and returned demonstration  HOME EXERCISE PROGRAM:  Access Code: [email or web address] URL: [email or web address] Date: 03/06/2024 Prepared by: Lonni Pall  Exercises - Standing March with Counter Support  - 1 x daily - 3-4 x weekly - 2-3 sets - 10-12 reps - Feet Together Balance at [employer] Eyes Closed  - 1 x daily - 7 x weekly - 3 sets - 30 hold - Sit to Stand with Arms Crossed  - 1 x daily - 7 x weekly - 3 sets - 10 reps - Semi-Tandem Balance at [employer] Eyes Open  - 1 x daily - 7 x weekly - 5 sets - 10 hold - Tandem Walking with Counter Support  - 1 x daily - 7 x weekly - 3 sets - 10 reps - Mini Squat with Counter Support  - 1 x daily - 3-4 x weekly - 2-3 sets - 10 reps  Access Code: [email or web address] URL: [email or web address] Date: 01/24/2024 Prepared by: Lonni Pall  Exercises - Standing March with Counter Support  - 1 x daily - 3-4 x weekly - 2-3 sets - 10-12 reps - Feet Together Balance at [employer] Eyes Closed  - 1 x daily - 7 x weekly - 3 sets - 30 hold - Sit to Stand with Armchair  - 1 x daily - 3-4 x weekly - 2-3 sets - 8-10 reps  ASSESSMENT:  CLINICAL  IMPRESSION: Continued PT POC in management of unsteadiness and L knee pain. PT session focused on functional strengthening in order to improve walking capacity, knee and hip stability. Good tolerance to all PT interventions with appropriate seated rest intervals. Patient's BP monitored throughout session and remained stable (see above for trend). Patient's knee pain still persistent she reports that it is located inferior to lateral joint line of L knee. PT to trial tibialis and peroneal strengthening in order to improve ankle stability and knee pain. Based on today's performance, patient will still benefit from skilled physical therapy in order to maximize decreased fall risk for safe discharge and improved QoL.   OBJECTIVE IMPAIRMENTS: Abnormal gait, decreased activity tolerance, decreased balance, decreased endurance, difficulty walking, decreased ROM, decreased strength, impaired sensation, and pain.   ACTIVITY LIMITATIONS: carrying, lifting, standing, squatting, and stairs  PARTICIPATION LIMITATIONS: shopping, community activity, and yard work  PERSONAL FACTORS: Age, Past/current experiences, Profession, Sex, Time since onset of injury/illness/exacerbation, and 1-2 comorbidities: HTN, Hx of TIA are also affecting patient's functional outcome.   REHAB POTENTIAL: Fair    CLINICAL DECISION MAKING: Evolving/moderate complexity  EVALUATION COMPLEXITY: Moderate   GOALS: Goals reviewed with patient? Yes  SHORT TERM GOALS: Target date: 03/06/2024  Pt will be independent with HEP to improve strength and decrease knee pain to improve pain-free function at home and work. Baseline: 01/24/2024: Initial HEP provided; 03/06/2024: Pt endorsed 65% adherence Goal status: Progressing   LONG TERM GOALS: Target date: 05/01/2024   Pt will improve ABC by at least 13% in order to demonstrate clinically significant improvement in balance confidence. Baseline: 01/24/2024: ABC score: 1190 / 1600 = 74.4 %;   03/06/2024: 85.62% Goal status: Progressing  2.  Pt will decrease worst knee pain by at least 3 points on the NPRS in order to demonstrate clinically significant reduction in knee pain. Baseline: 01/24/2024: 09/10 NPS; 02/28/2024: 10/10 NPS; 03/27/2024:  Goal status: Progressing   3.  Pt will increase LEFS score by at least 9 points in order demonstrate clinically significant reduction in knee pain/disability.    Baseline: 01/24/2024: 48/80 (80/80 = no disability); 03/06/2024: 63/80 (80/80 = no disability) Goal status: Goal Met   4.  Pt will increase strength of knee flexion/extension by at least 1/2 MMT grade in order to demonstrate improvement in strength and function  Baseline: 01/24/2024: R/L: 4- knee Flexion, R/L 4/5 Knee Extension; 03/06/2024: R/L: 4+/4+ knee flexion, R/L: 4+/4+ Knee extension Goal status: Progressing  5.  Pt will increase by at least 0.13 m/s in order to demonstrate clinically significant improvement in community ambulation.  Baseline: 01/24/2024: 19.77s without AD; 03/02/2024: .87 m/s without AD; 03/06/2024: 1.02 m/s Goal status: Goal met  6.  Pt will decrease 5TSTS by at least 3 seconds in order to demonstrate clinically significant improvement in LE strength and functional ability.  Baseline: 01/24/2024: 15.88s; 03/06/2024: 9.85 m/s Goal status: Goal Met  7.  Pt will increase 30s STS repetitions by 5 reps in order to demonstrate improvements in LE strength and endurance.  Baseline: 03/06/2024: 17 reps  Goal status:  Initial   8.  The patient will improve their Functional Gait Assessment (FGA) score from to >=22/30 to reduce fall risk and improve community mobility. Baseline: 03/06/2024: 20/30 ( <22/30 - high fall risk)   Goal status: Initial   PLAN:  PT FREQUENCY: 1-2x/week  PT DURATION: 12 weeks  PLANNED INTERVENTIONS: 97110-Therapeutic exercises, 97530- Therapeutic activity, 97112- Neuromuscular re-education, 97535- Self Care, [id number]- Manual  therapy, [phone]- Gait training, Patient/Family education, Balance training, Stair training, Cryotherapy, and Moist heat  PLAN FOR NEXT SESSION: Progressing Gait Training with AD, Balance Training, LE strengthening, Reactional Training   Lonni Pall PT, DPT Physical Therapist- [place]  04/11/2024, 1:10 PM  "

## 2024-04-13 ENCOUNTER — Ambulatory Visit

## 2024-04-13 ENCOUNTER — Telehealth: Payer: Self-pay

## 2024-04-13 NOTE — Telephone Encounter (Signed)
 Called patient about missed appointment. No answer but LVM regarding next following appointment. Advised of no show policy and to call for future rescheduling or concerns.   Lonni Pall PT, DPT Physical Therapist- [place]

## 2024-04-18 ENCOUNTER — Ambulatory Visit

## 2024-04-18 DIAGNOSIS — M6281 Muscle weakness (generalized): Secondary | ICD-10-CM

## 2024-04-18 DIAGNOSIS — R2681 Unsteadiness on feet: Secondary | ICD-10-CM

## 2024-04-18 DIAGNOSIS — G8929 Other chronic pain: Secondary | ICD-10-CM

## 2024-04-18 NOTE — Therapy (Signed)
 " OUTPATIENT PHYSICAL THERAPY KNEE/BALANCE TREATMENT   Patient Name: Kiara [name] MRN: [id number] DOB:May 26, 1934, 89 y.o., female Today's Date: 04/18/2024  END OF SESSION:  PT End of Session - 04/18/24 0949     Visit Number 17    Number of Visits 25    Date for Recertification  05/01/24    Authorization Type auth 26 visits  10/27 - 05/27/24  Authorization #[id number]    Authorization Time Period 10/27 - 05/27/24    Authorization - Visit Number 17    Authorization - Number of Visits 26    Progress Note Due on Visit 20    PT Start Time 0945    PT Stop Time 1025    PT Time Calculation (min) 40 min    Activity Tolerance Patient tolerated treatment well    Behavior During Therapy WFL for tasks assessed/performed         Past Medical History:  Diagnosis Date   Arthritis    osteoarthritis -right knee.   C. difficile colitis    past,no recent issues   Colitis    Diverticulitis    Fall    tripped-10 days ago-scrapped right knee and lateral right brow area- healing   GERD (gastroesophageal reflux disease)    occ.   Seasonal allergies    Past Surgical History:  Procedure Laterality Date   APPENDECTOMY     CATARACT EXTRACTION, BILATERAL     EYE SURGERY     ? right eye retina repair.   HEMORRHOID SURGERY     KNEE ARTHROSCOPY Right 11/26/2014   Procedure: ARTHROSCOPY KNEE, Tear medical horn and anterior horn. Lateral mesicus tear, grade 3 medial;  Surgeon: Lynwood SHAUNNA Hue, MD;  Location: [facility];  Service: Orthopedics;  Laterality: Right;   MOLE REMOVAL     benign   TONSILLECTOMY     TOTAL KNEE ARTHROPLASTY Right 02/12/2016   Procedure: RIGHT TOTAL KNEE ARTHROPLASTY;  Surgeon: Dempsey Moan, MD;  Location: [facility];  Service: Orthopedics;  Laterality: Right;   Patient Active Problem List   Diagnosis Date Noted   Mild cognitive impairment 02/02/2022   TIA (transient ischemic attack) 08/08/2020   Degenerative lumbar spinal stenosis 06/29/2019   Pulmonary nodules/lesions,  multiple 02/08/2019   Exudative age-related macular degeneration of left eye with active choroidal neovascularization (HCC) 01/20/2019   Idiopathic peripheral neuropathy 01/20/2019   Radial styloid tenosynovitis 03/28/2018   Pain in right knee 02/23/2018   History of Clostridium difficile colitis 12/16/2017   Irritable bowel syndrome without diarrhea 12/16/2017   Cataract cortical, senile 08/03/2017   Chronic colitis 08/03/2017   Medicare annual wellness visit, initial 12/15/2016   Benign essential hypertension 11/04/2016   OA (osteoarthritis) of knee 02/12/2016   Hyperlipidemia, mixed 10/24/2015   Clostridium difficile colitis 01/02/2014   Right bundle branch block 10/18/2013    PCP: Cleotilde Oneil FALCON, MD  REFERRING PROVIDER: Marchia Drivers, MD  REFERRING DIAG: M17.0 (ICD-10-CM) - Primary osteoarthritis of both knees   RATIONALE FOR EVALUATION AND TREATMENT: Rehabilitation  THERAPY DIAG: Unsteadiness on feet  Muscle weakness (generalized)  Chronic pain of both knees  ONSET DATE: Chronic     FOLLOW-UP APPT SCHEDULED WITH REFERRING PROVIDER: No    SUBJECTIVE:  SUBJECTIVE STATEMENT:    Patient is an 89 year old female presenting with unsteadiness and R/L knee pain.   PERTINENT HISTORY:   Kiara [name] is an 89 year old female presenting to OPPT with a chief concern of balance deficits and bilateral knee pain. She has a history of TIA, falls and lumbar spinal stenosis and R TKA. Patient received corticosteroid injection the L knee. History of balance deficits primarily due to baseline peripheral neuropathy in bilateral feet contributing to difficulty walking. She reports that her balance has worsened within the last month. Patient currently walking with hurry cane in the community. She reports  that she drifts to one side (L > R) while walking. Patient reports that she will have directed radiation towards the L knee performed by her oncologist.   R Hand Dominant.   Imaging: No   PAIN:   Pain Intensity: Present: 0/10, Best: 0/10, Worst: 9/10 Pain location: bilateral knees  Pain quality: intermittent and sharp  Radiating pain: No  Swelling: No  Numbness/Tingling: Yes Focal weakness or buckling: No Aggravating factors: Walking Relieving factors: Sitting   PRECAUTIONS: Fall  WEIGHT BEARING RESTRICTIONS: No  FALLS: Has patient fallen in last 6 months? Yes. Number of falls 1  Living Environment Lives with: lives alone Lives in: House/apartment Stairs: No Has following equipment at home: Vannie - 4 wheeled and hurry cane  Prior level of function: Independent  Occupational demands: Retired  Presenter, Broadcasting: Taking care of her dog, walking   Patient Goals: I would like to be able to walk without assistance    OBJECTIVE:   PATIENT SURVEYS:  LEFS: 48/80 (80/80 = no disability) ABC: 74/100% - Moderate confidence    Cognition Patient is oriented to person, place, and time.  Recent memory is intact.  Accompanied by daughter: Kiara [name]   Gross Musculoskeletal Assessment Tremor: None Bulk: Normal Tone: Normal  GAIT: Distance walked: 17m  Assistive device utilized: hurry cane Level of assistance: Modified independence Comments: R hand hurry cane, reciprocal gait   Posture:  AROM AROM (Normal range in degrees) AROM  Hip Right Left  Flexion (125)    Extension (15)    Abduction (40)    Adduction     Internal Rotation (45)    External Rotation (45)        Knee    Flexion (135)    Extension (0)        Ankle    Dorsiflexion (20)    Plantarflexion (50)    Inversion (35)    Eversion (15    (* = pain; Blank rows = not tested)  LE MMT: MMT (out of 5) Right Left  Hip flexion 4- 4-  Hip extension    Hip abduction    Hip adduction    Hip internal rotation     Hip external rotation    Knee flexion 4- 4-  Knee extension 4 4  Ankle dorsiflexion 5 5  Ankle plantarflexion 5 5  Ankle inversion    Ankle eversion    (* = pain; Blank rows = not tested)  Sensation Grossly intact to light touch bilateral LEs as determined by testing dermatomes L2-S2. Proprioception, and hot/cold testing deferred on this date.  Reflexes R/L Knee Jerk (L3/4): 2+/1+  Ankle Jerk (S1/2): 2+/1+  Muscle Length Not tested  Palpation Location LEFT  RIGHT           Quadriceps 0 0  Medial Hamstrings 0 0  Lateral Hamstrings 0 0  Lateral Hamstring tendon  Medial Hamstring tendon    Quadriceps tendon 0 0  Patella    Patellar Tendon    Tibial Tuberosity    Medial joint line    Lateral joint line    MCL    LCL    Adductor Tubercle    Pes Anserine tendon    Infrapatellar fat pad    Fibular head    Popliteal fossa    (Blank rows = not tested) Graded on 0-4 scale (0 = no pain, 1 = pain, 2 = pain with wincing/grimacing/flinching, 3 = pain with withdrawal, 4 = unwilling to allow palpation), (Blank rows = not tested)  Passive Accessory Motion Deferred  Functional Test:  : 19.77 5TSTS: 15.88s  TODAY'S TREATMENT: DATE: 04/18/24  Precaution per MD 10/31: BP should be under 150/90 and above 100/60 in resting position. Systolic of 180-190 with sx prompt emergency care.   Subjective: Patient reports that she has minimal knee pain prior to today's session. She reports that her blood pressure isn't consistent when she takes the measurement at home. No further questions or concerns.   Pre Exercise: Vitals (Seated, Left Brachial):   Blood Pressure: 146/60  HR: 75   Therapeutic Activity: NuStep- Pace Partner mode - L6-2 x 5 min x UE/LE (Seat 8) to improve LE endurance and strength for increased walking capacity; PT manually adjusted resistance per patient tolerance.    STS with Med ball   2 x 10 - 3 Kg Ball  1 x 10 - 3 Kg MB press   Standing Marches to  hip height - SBA   2 x 20 - 4# AW donned   Side Stepping against resistance - Supervision   4 x 12' - 4# AW Donned   4 x 12' - Red TB around ankles   4 x 12' - Red TB around ankles   Reverse Lunge - SUE Support - SBA  R/L in front: 3 x 5 reps   Neuromuscular Re-Education  Cross Over Stepping - SBA   R over L 2 x 20'   L over R 2 x 20'   Forward Walking with HT   Horizontal: 3 x 80m  Vertical: 3 x 53m   Retro walking   3 x 20' - HH support > SBA   - Slowed pace and minor waver from midline    - increased step length and pace with VC and HH support from PT   Eyes Closed Gait  4 x 20' - Close Supervision > HH assist   - Wavered from midline and with slowed velocity without PT assistance    - Good ability to increase step length and maintain close to midline with single HH support  PATIENT EDUCATION:  Education details: Exercise Technique  Person educated: Patient Education method: Explanation, Demonstration, and Handouts Education comprehension: verbalized understanding and returned demonstration  HOME EXERCISE PROGRAM:  Access Code: [email or web address] URL: [email or web address] Date: 03/06/2024 Prepared by: Lonni Pall  Exercises - Standing March with Counter Support  - 1 x daily - 3-4 x weekly - 2-3 sets - 10-12 reps - Feet Together Balance at [employer] Eyes Closed  - 1 x daily - 7 x weekly - 3 sets - 30 hold - Sit to Stand with Arms Crossed  - 1 x daily - 7 x weekly - 3 sets - 10 reps - Semi-Tandem Balance at [employer] Eyes Open  - 1 x daily - 7 x weekly - 5 sets - 10 hold - Tandem Walking  with Counter Support  - 1 x daily - 7 x weekly - 3 sets - 10 reps - Mini Squat with Counter Support  - 1 x daily - 3-4 x weekly - 2-3 sets - 10 reps  Access Code: [email or web address] URL: [email or web address] Date: 01/24/2024 Prepared by: Lonni Pall  Exercises - Standing March with Counter Support  - 1 x daily - 3-4 x weekly - 2-3 sets - 10-12 reps - Feet  Together Balance at [employer] Eyes Closed  - 1 x daily - 7 x weekly - 3 sets - 30 hold - Sit to Stand with Armchair  - 1 x daily - 3-4 x weekly - 2-3 sets - 8-10 reps  ASSESSMENT:  CLINICAL IMPRESSION: Continued PT POC in management of unsteadiness and L knee pain. Functional strength and dynamic balance addressed in today's appointment. PT focused on tasks with deficits per FGA (retro stepping, narrow BoS, gait with eyes closed).  Patient demonstrated good ability to perform walking with head turns and cross over stepping without PT assistance. She wavers from midline significantly with retro stepping and eyes closed gait however it improves with HH support and verbal cues for increased step length. BP monitored throughout session and VSS without symptomatic response. Based on today's performance, patient will still benefit from skilled physical therapy in order to maximize decreased fall risk for safe discharge and improved QoL.   OBJECTIVE IMPAIRMENTS: Abnormal gait, decreased activity tolerance, decreased balance, decreased endurance, difficulty walking, decreased ROM, decreased strength, impaired sensation, and pain.   ACTIVITY LIMITATIONS: carrying, lifting, standing, squatting, and stairs  PARTICIPATION LIMITATIONS: shopping, community activity, and yard work  PERSONAL FACTORS: Age, Past/current experiences, Profession, Sex, Time since onset of injury/illness/exacerbation, and 1-2 comorbidities: HTN, Hx of TIA are also affecting patient's functional outcome.   REHAB POTENTIAL: Fair    CLINICAL DECISION MAKING: Evolving/moderate complexity  EVALUATION COMPLEXITY: Moderate   GOALS: Goals reviewed with patient? Yes  SHORT TERM GOALS: Target date: 03/06/2024  Pt will be independent with HEP to improve strength and decrease knee pain to improve pain-free function at home and work. Baseline: 01/24/2024: Initial HEP provided; 03/06/2024: Pt endorsed 65% adherence Goal status:  Progressing   LONG TERM GOALS: Target date: 05/01/2024   Pt will improve ABC by at least 13% in order to demonstrate clinically significant improvement in balance confidence. Baseline: 01/24/2024: ABC score: 1190 / 1600 = 74.4 %;  03/06/2024: 85.62% Goal status: Progressing  2.  Pt will decrease worst knee pain by at least 3 points on the NPRS in order to demonstrate clinically significant reduction in knee pain. Baseline: 01/24/2024: 09/10 NPS; 02/28/2024: 10/10 NPS; 03/27/2024:  Goal status: Progressing   3.  Pt will increase LEFS score by at least 9 points in order demonstrate clinically significant reduction in knee pain/disability.    Baseline: 01/24/2024: 48/80 (80/80 = no disability); 03/06/2024: 63/80 (80/80 = no disability) Goal status: Goal Met   4.  Pt will increase strength of knee flexion/extension by at least 1/2 MMT grade in order to demonstrate improvement in strength and function  Baseline: 01/24/2024: R/L: 4- knee Flexion, R/L 4/5 Knee Extension; 03/06/2024: R/L: 4+/4+ knee flexion, R/L: 4+/4+ Knee extension Goal status: Progressing  5.  Pt will increase by at least 0.13 m/s in order to demonstrate clinically significant improvement in community ambulation.  Baseline: 01/24/2024: 19.77s without AD; 03/02/2024: .87 m/s without AD; 03/06/2024: 1.02 m/s Goal status: Goal met  6.  Pt will decrease 5TSTS by at least 3  seconds in order to demonstrate clinically significant improvement in LE strength and functional ability.  Baseline: 01/24/2024: 15.88s; 03/06/2024: 9.85 m/s Goal status: Goal Met  7.  Pt will increase 30s STS repetitions by 5 reps in order to demonstrate improvements in LE strength and endurance.  Baseline: 03/06/2024: 17 reps  Goal status: Initial   8.  The patient will improve their Functional Gait Assessment (FGA) score from to >=22/30 to reduce fall risk and improve community mobility. Baseline: 03/06/2024: 20/30 ( <22/30 - high fall risk)   Goal  status: Initial   PLAN:  PT FREQUENCY: 1-2x/week  PT DURATION: 12 weeks  PLANNED INTERVENTIONS: 97110-Therapeutic exercises, 97530- Therapeutic activity, 97112- Neuromuscular re-education, 97535- Self Care, [id number]- Manual therapy, [id number]- Gait training, Patient/Family education, Balance training, Stair training, Cryotherapy, and Moist heat  PLAN FOR NEXT SESSION: Progressing Gait Training with AD, Balance Training, LE strengthening, Reactional Training   Lonni Pall PT, DPT Physical Therapist- [place]  04/18/2024, 9:52 AM  "

## 2024-04-20 ENCOUNTER — Ambulatory Visit

## 2024-04-20 DIAGNOSIS — M6281 Muscle weakness (generalized): Secondary | ICD-10-CM

## 2024-04-20 DIAGNOSIS — G8929 Other chronic pain: Secondary | ICD-10-CM

## 2024-04-20 DIAGNOSIS — R2681 Unsteadiness on feet: Secondary | ICD-10-CM | POA: Diagnosis not present

## 2024-04-20 NOTE — Therapy (Signed)
 " OUTPATIENT PHYSICAL THERAPY KNEE/BALANCE TREATMENT   Patient Name: Kiara [name] MRN: [id number] DOB:May 03, 1934, 89 y.o., female Today's Date: 04/20/2024  END OF SESSION:  PT End of Session - 04/20/24 1308     Visit Number 18    Number of Visits 25    Date for Recertification  05/01/24    Authorization Type auth 26 visits  10/27 - 05/27/24  Authorization #[id number]    Authorization Time Period 10/27 - 05/27/24    Authorization - Visit Number 18    Authorization - Number of Visits 26    Progress Note Due on Visit 20    PT Start Time 1305    PT Stop Time 1345    PT Time Calculation (min) 40 min    Activity Tolerance Patient tolerated treatment well    Behavior During Therapy WFL for tasks assessed/performed         Past Medical History:  Diagnosis Date   Arthritis    osteoarthritis -right knee.   C. difficile colitis    past,no recent issues   Colitis    Diverticulitis    Fall    tripped-10 days ago-scrapped right knee and lateral right brow area- healing   GERD (gastroesophageal reflux disease)    occ.   Seasonal allergies    Past Surgical History:  Procedure Laterality Date   APPENDECTOMY     CATARACT EXTRACTION, BILATERAL     EYE SURGERY     ? right eye retina repair.   HEMORRHOID SURGERY     KNEE ARTHROSCOPY Right 11/26/2014   Procedure: ARTHROSCOPY KNEE, Tear medical horn and anterior horn. Lateral mesicus tear, grade 3 medial;  Surgeon: Lynwood SHAUNNA Hue, MD;  Location: [facility];  Service: Orthopedics;  Laterality: Right;   MOLE REMOVAL     benign   TONSILLECTOMY     TOTAL KNEE ARTHROPLASTY Right 02/12/2016   Procedure: RIGHT TOTAL KNEE ARTHROPLASTY;  Surgeon: Dempsey Moan, MD;  Location: [facility];  Service: Orthopedics;  Laterality: Right;   Patient Active Problem List   Diagnosis Date Noted   Mild cognitive impairment 02/02/2022   TIA (transient ischemic attack) 08/08/2020   Degenerative lumbar spinal stenosis 06/29/2019   Pulmonary nodules/lesions,  multiple 02/08/2019   Exudative age-related macular degeneration of left eye with active choroidal neovascularization (HCC) 01/20/2019   Idiopathic peripheral neuropathy 01/20/2019   Radial styloid tenosynovitis 03/28/2018   Pain in right knee 02/23/2018   History of Clostridium difficile colitis 12/16/2017   Irritable bowel syndrome without diarrhea 12/16/2017   Cataract cortical, senile 08/03/2017   Chronic colitis 08/03/2017   Medicare annual wellness visit, initial 12/15/2016   Benign essential hypertension 11/04/2016   OA (osteoarthritis) of knee 02/12/2016   Hyperlipidemia, mixed 10/24/2015   Clostridium difficile colitis 01/02/2014   Right bundle branch block 10/18/2013    PCP: Cleotilde Oneil FALCON, MD  REFERRING PROVIDER: Marchia Drivers, MD  REFERRING DIAG: M17.0 (ICD-10-CM) - Primary osteoarthritis of both knees   RATIONALE FOR EVALUATION AND TREATMENT: Rehabilitation  THERAPY DIAG: Unsteadiness on feet  Muscle weakness (generalized)  Chronic pain of both knees  ONSET DATE: Chronic     FOLLOW-UP APPT SCHEDULED WITH REFERRING PROVIDER: No    SUBJECTIVE:  SUBJECTIVE STATEMENT:    Patient is an 89 year old female presenting with unsteadiness and R/L knee pain.   PERTINENT HISTORY:   [name] is an 89 year old female presenting to OPPT with a chief concern of balance deficits and bilateral knee pain. She has a history of TIA, falls and lumbar spinal stenosis and R TKA. Patient received corticosteroid injection the L knee. History of balance deficits primarily due to baseline peripheral neuropathy in bilateral feet contributing to difficulty walking. She reports that her balance has worsened within the last month. Patient currently walking with hurry cane in the community. She reports  that she drifts to one side (L > R) while walking. Patient reports that she will have directed radiation towards the L knee performed by her oncologist.   R Hand Dominant.   Imaging: No   PAIN:   Pain Intensity: Present: 0/10, Best: 0/10, Worst: 9/10 Pain location: bilateral knees  Pain quality: intermittent and sharp  Radiating pain: No  Swelling: No  Numbness/Tingling: Yes Focal weakness or buckling: No Aggravating factors: Walking Relieving factors: Sitting   PRECAUTIONS: Fall  WEIGHT BEARING RESTRICTIONS: No  FALLS: Has patient fallen in last 6 months? Yes. Number of falls 1  Living Environment Lives with: lives alone Lives in: House/apartment Stairs: No Has following equipment at home: Vannie - 4 wheeled and hurry cane  Prior level of function: Independent  Occupational demands: Retired  Presenter, Broadcasting: Taking care of her dog, walking   Patient Goals: I would like to be able to walk without assistance    OBJECTIVE:   PATIENT SURVEYS:  LEFS: 48/80 (80/80 = no disability) ABC: 74/100% - Moderate confidence    Cognition Patient is oriented to person, place, and time.  Recent memory is intact.  Accompanied by daughter: [name]   Gross Musculoskeletal Assessment Tremor: None Bulk: Normal Tone: Normal  GAIT: Distance walked: 89m  Assistive device utilized: hurry cane Level of assistance: Modified independence Comments: R hand hurry cane, reciprocal gait   Posture:  AROM AROM (Normal range in degrees) AROM  Hip Right Left  Flexion (125)    Extension (15)    Abduction (40)    Adduction     Internal Rotation (45)    External Rotation (45)        Knee    Flexion (135)    Extension (0)        Ankle    Dorsiflexion (20)    Plantarflexion (50)    Inversion (35)    Eversion (15    (* = pain; Blank rows = not tested)  LE MMT: MMT (out of 5) Right Left  Hip flexion 4- 4-  Hip extension    Hip abduction    Hip adduction    Hip internal rotation     Hip external rotation    Knee flexion 4- 4-  Knee extension 4 4  Ankle dorsiflexion 5 5  Ankle plantarflexion 5 5  Ankle inversion    Ankle eversion    (* = pain; Blank rows = not tested)  Sensation Grossly intact to light touch bilateral LEs as determined by testing dermatomes L2-S2. Proprioception, and hot/cold testing deferred on this date.  Reflexes R/L Knee Jerk (L3/4): 2+/1+  Ankle Jerk (S1/2): 2+/1+  Muscle Length Not tested  Palpation Location LEFT  RIGHT           Quadriceps 0 0  Medial Hamstrings 0 0  Lateral Hamstrings 0 0  Lateral Hamstring tendon  Medial Hamstring tendon    Quadriceps tendon 0 0  Patella    Patellar Tendon    Tibial Tuberosity    Medial joint line    Lateral joint line    MCL    LCL    Adductor Tubercle    Pes Anserine tendon    Infrapatellar fat pad    Fibular head    Popliteal fossa    (Blank rows = not tested) Graded on 0-4 scale (0 = no pain, 1 = pain, 2 = pain with wincing/grimacing/flinching, 3 = pain with withdrawal, 4 = unwilling to allow palpation), (Blank rows = not tested)  Passive Accessory Motion Deferred  Functional Test:  : 19.77 5TSTS: 15.88s  TODAY'S TREATMENT: DATE: 04/20/24  Precaution per MD 10/31: BP should be under 150/90 and above 100/60 in resting position. Systolic of 180-190 with sx prompt emergency care.   Subjective: Patient reports that she is walking better. She fell last night while walking to the bathroom. She denies head injury, unrelenting pain and hip injury.  No further questions or concerns.   Pre Exercise: Vitals (Seated, Left Brachial):   Blood Pressure: 135/69  HR: 75   Therapeutic Activity: NuStep- Pace Partner mode - L6-2 x 5 min x UE/LE (Seat 8) x SPM > 90 to improve LE endurance and strength for increased walking capacity; PT manually adjusted resistance per patient tolerance.    Partial Squats on Airex   1 x 10 - intermittent UE Support   2 x 10    STS onto Airex  Pad with OH reach (3 Kg Med ball)  2 x 10    Reverse Lunge    R/L: 2 x 8 Reps per leg   Neuromuscular Re-Education  Cross Over Stepping - SBA   R over L 2 x 20'   L over R 2 x 20'   Tandem Stepping   5 x 10 ft - SBA  - Pt able to achieve 5 steps consistently before needing UE support   Retro walking   4 x 20' - HH support > SBA   Eyes Closed Gait  4 x 20' - Close Supervision > HH assist   - patient with continued R sided drift, no improvements with VC   PATIENT EDUCATION:  Education details: Exercise Technique  Person educated: Patient Education method: Explanation, Demonstration, and Handouts Education comprehension: verbalized understanding and returned demonstration  HOME EXERCISE PROGRAM:  Access Code: [email or web address] URL: [email or web address] Date: 03/06/2024 Prepared by: Lonni Pall  Exercises - Standing March with Counter Support  - 1 x daily - 3-4 x weekly - 2-3 sets - 10-12 reps - Feet Together Balance at [employer] Eyes Closed  - 1 x daily - 7 x weekly - 3 sets - 30 hold - Sit to Stand with Arms Crossed  - 1 x daily - 7 x weekly - 3 sets - 10 reps - Semi-Tandem Balance at [employer] Eyes Open  - 1 x daily - 7 x weekly - 5 sets - 10 hold - Tandem Walking with Counter Support  - 1 x daily - 7 x weekly - 3 sets - 10 reps - Mini Squat with Counter Support  - 1 x daily - 3-4 x weekly - 2-3 sets - 10 reps  Access Code: [email or web address] URL: [email or web address] Date: 01/24/2024 Prepared by: Lonni Pall  Exercises - Standing March with Counter Support  - 1 x daily - 3-4 x weekly - 2-3 sets - 10-12 reps - Feet  Together Balance at [employer] Closed  - 1 x daily - 7 x weekly - 3 sets - 30 hold - Sit to Stand with Armchair  - 1 x daily - 3-4 x weekly - 2-3 sets - 8-10 reps  ASSESSMENT:  CLINICAL IMPRESSION: Continued PT POC in management of unsteadiness and L knee pain. Patient tolerated progressions to functional strengthening  exercises. Her retro stepping and tandem stepping has improved significantly since beginning of POC; she is able to perform the activity without PT assistance. Patient required seated rest breaks due to quick muscle fatigue with repetitive squats and STS. VSS stable throughout the session. Patient without evidence of hip pain and no tenderness along greater trochanter following recent fall. PT to continue monitoring symptoms if they arise in following appointments. Based on today's performance, patient will still benefit from skilled physical therapy in order to maximize decreased fall risk for safe discharge and improved QoL.   OBJECTIVE IMPAIRMENTS: Abnormal gait, decreased activity tolerance, decreased balance, decreased endurance, difficulty walking, decreased ROM, decreased strength, impaired sensation, and pain.   ACTIVITY LIMITATIONS: carrying, lifting, standing, squatting, and stairs  PARTICIPATION LIMITATIONS: shopping, community activity, and yard work  PERSONAL FACTORS: Age, Past/current experiences, Profession, Sex, Time since onset of injury/illness/exacerbation, and 1-2 comorbidities: HTN, Hx of TIA are also affecting patient's functional outcome.   REHAB POTENTIAL: Fair    CLINICAL DECISION MAKING: Evolving/moderate complexity  EVALUATION COMPLEXITY: Moderate   GOALS: Goals reviewed with patient? Yes  SHORT TERM GOALS: Target date: 03/06/2024  Pt will be independent with HEP to improve strength and decrease knee pain to improve pain-free function at home and work. Baseline: 01/24/2024: Initial HEP provided; 03/06/2024: Pt endorsed 65% adherence Goal status: Progressing   LONG TERM GOALS: Target date: 05/01/2024   Pt will improve ABC by at least 13% in order to demonstrate clinically significant improvement in balance confidence. Baseline: 01/24/2024: ABC score: 1190 / 1600 = 74.4 %;  03/06/2024: 85.62% Goal status: Progressing  2.  Pt will decrease worst knee pain by at  least 3 points on the NPRS in order to demonstrate clinically significant reduction in knee pain. Baseline: 01/24/2024: 09/10 NPS; 02/28/2024: 10/10 NPS; 03/27/2024:  Goal status: Progressing   3.  Pt will increase LEFS score by at least 9 points in order demonstrate clinically significant reduction in knee pain/disability.    Baseline: 01/24/2024: 48/80 (80/80 = no disability); 03/06/2024: 63/80 (80/80 = no disability) Goal status: Goal Met   4.  Pt will increase strength of knee flexion/extension by at least 1/2 MMT grade in order to demonstrate improvement in strength and function  Baseline: 01/24/2024: R/L: 4- knee Flexion, R/L 4/5 Knee Extension; 03/06/2024: R/L: 4+/4+ knee flexion, R/L: 4+/4+ Knee extension Goal status: Progressing  5.  Pt will increase by at least 0.13 m/s in order to demonstrate clinically significant improvement in community ambulation.  Baseline: 01/24/2024: 19.77s without AD; 03/02/2024: .87 m/s without AD; 03/06/2024: 1.02 m/s Goal status: Goal met  6.  Pt will decrease 5TSTS by at least 3 seconds in order to demonstrate clinically significant improvement in LE strength and functional ability.  Baseline: 01/24/2024: 15.88s; 03/06/2024: 9.85 m/s Goal status: Goal Met  7.  Pt will increase 30s STS repetitions by 5 reps in order to demonstrate improvements in LE strength and endurance.  Baseline: 03/06/2024: 17 reps  Goal status: Initial   8.  The patient will improve their Functional Gait Assessment (FGA) score from to >=22/30 to reduce fall risk  and improve community mobility. Baseline: 03/06/2024: 20/30 ( <22/30 - high fall risk)   Goal status: Initial   PLAN:  PT FREQUENCY: 1-2x/week  PT DURATION: 12 weeks  PLANNED INTERVENTIONS: 97110-Therapeutic exercises, 97530- Therapeutic activity, 97112- Neuromuscular re-education, 97535- Self Care, [id number]- Manual therapy, [id number]- Gait training, Patient/Family education, Balance training, Stair training,  Cryotherapy, and Moist heat  PLAN FOR NEXT SESSION: Progressing Gait Training with AD, Balance Training, LE strengthening, Reactional Training   Lonni Pall PT, DPT Physical Therapist- [place]  04/20/2024, 1:10 PM  "

## 2024-04-25 ENCOUNTER — Ambulatory Visit

## 2024-04-25 DIAGNOSIS — M6281 Muscle weakness (generalized): Secondary | ICD-10-CM

## 2024-04-25 DIAGNOSIS — R2681 Unsteadiness on feet: Secondary | ICD-10-CM | POA: Diagnosis not present

## 2024-04-25 DIAGNOSIS — G8929 Other chronic pain: Secondary | ICD-10-CM

## 2024-04-25 NOTE — Therapy (Signed)
 " OUTPATIENT PHYSICAL THERAPY KNEE/BALANCE TREATMENT   Patient Name: Kiara [name] MRN: [id number] DOB:[birth year], 89 y.o., female Today's Date: 04/25/2024  END OF SESSION:  PT End of Session - 04/25/24 1305     Visit Number 19    Number of Visits 25    Date for Recertification  05/01/24    Authorization Type auth 26 visits  10/27 - 05/27/24  Authorization #[id number]    Authorization Time Period 10/27 - 05/27/24    Authorization - Visit Number 19    Authorization - Number of Visits 26    Progress Note Due on Visit 20    PT Start Time 1304    PT Stop Time 1345    PT Time Calculation (min) 41 min    Activity Tolerance Patient tolerated treatment well    Behavior During Therapy WFL for tasks assessed/performed         Past Medical History:  Diagnosis Date   Arthritis    osteoarthritis -right knee.   C. difficile colitis    past,no recent issues   Colitis    Diverticulitis    Fall    tripped-10 days ago-scrapped right knee and lateral right brow area- healing   GERD (gastroesophageal reflux disease)    occ.   Seasonal allergies    Past Surgical History:  Procedure Laterality Date   APPENDECTOMY     CATARACT EXTRACTION, BILATERAL     EYE SURGERY     ? right eye retina repair.   HEMORRHOID SURGERY     KNEE ARTHROSCOPY Right 11/26/2014   Procedure: ARTHROSCOPY KNEE, Tear medical horn and anterior horn. Lateral mesicus tear, grade 3 medial;  Surgeon: Lynwood SHAUNNA Hue, MD;  Location: [facility];  Service: Orthopedics;  Laterality: Right;   MOLE REMOVAL     benign   TONSILLECTOMY     TOTAL KNEE ARTHROPLASTY Right 02/12/2016   Procedure: RIGHT TOTAL KNEE ARTHROPLASTY;  Surgeon: Dempsey Moan, MD;  Location: [facility];  Service: Orthopedics;  Laterality: Right;   Patient Active Problem List   Diagnosis Date Noted   Mild cognitive impairment 02/02/2022   TIA (transient ischemic attack) 08/08/2020   Degenerative lumbar spinal stenosis 06/29/2019   Pulmonary nodules/lesions,  multiple 02/08/2019   Exudative age-related macular degeneration of left eye with active choroidal neovascularization (HCC) 01/20/2019   Idiopathic peripheral neuropathy 01/20/2019   Radial styloid tenosynovitis 03/28/2018   Pain in right knee 02/23/2018   History of Clostridium difficile colitis 12/16/2017   Irritable bowel syndrome without diarrhea 12/16/2017   Cataract cortical, senile 08/03/2017   Chronic colitis 08/03/2017   Medicare annual wellness visit, initial 12/15/2016   Benign essential hypertension 11/04/2016   OA (osteoarthritis) of knee 02/12/2016   Hyperlipidemia, mixed 10/24/2015   Clostridium difficile colitis 01/02/2014   Right bundle branch block 10/18/2013    PCP: Cleotilde Oneil FALCON, MD  REFERRING PROVIDER: Marchia Drivers, MD  REFERRING DIAG: M17.0 (ICD-10-CM) - Primary osteoarthritis of both knees   RATIONALE FOR EVALUATION AND TREATMENT: Rehabilitation  THERAPY DIAG: Unsteadiness on feet  Muscle weakness (generalized)  Chronic pain of both knees  ONSET DATE: Chronic     FOLLOW-UP APPT SCHEDULED WITH REFERRING PROVIDER: No    SUBJECTIVE:  SUBJECTIVE STATEMENT:    Patient is an 89 year old female presenting with unsteadiness and R/L knee pain.   PERTINENT HISTORY:   Kiara [name] is an 89 year old female presenting to OPPT with a chief concern of balance deficits and bilateral knee pain. She has a history of TIA, falls and lumbar spinal stenosis and R TKA. Patient received corticosteroid injection the L knee. History of balance deficits primarily due to baseline peripheral neuropathy in bilateral feet contributing to difficulty walking. She reports that her balance has worsened within the last month. Patient currently walking with hurry cane in the community. She reports  that she drifts to one side (L > R) while walking. Patient reports that she will have directed radiation towards the L knee performed by her oncologist.   R Hand Dominant.   Imaging: No   PAIN:   Pain Intensity: Present: 0/10, Best: 0/10, Worst: 9/10 Pain location: bilateral knees  Pain quality: intermittent and sharp  Radiating pain: No  Swelling: No  Numbness/Tingling: Yes Focal weakness or buckling: No Aggravating factors: Walking Relieving factors: Sitting   PRECAUTIONS: Fall  WEIGHT BEARING RESTRICTIONS: No  FALLS: Has patient fallen in last 6 months? Yes. Number of falls 1  Living Environment Lives with: lives alone Lives in: House/apartment Stairs: No Has following equipment at home: Vannie - 4 wheeled and hurry cane  Prior level of function: Independent  Occupational demands: Retired  Presenter, Broadcasting: Taking care of her dog, walking   Patient Goals: I would like to be able to walk without assistance    OBJECTIVE:   PATIENT SURVEYS:  LEFS: 48/80 (80/80 = no disability) ABC: 74/100% - Moderate confidence    Cognition Patient is oriented to person, place, and time.  Recent memory is intact.  Accompanied by daughter: [name]   Gross Musculoskeletal Assessment Tremor: None Bulk: Normal Tone: Normal  GAIT: Distance walked: 16m  Assistive device utilized: hurry cane Level of assistance: Modified independence Comments: R hand hurry cane, reciprocal gait   Posture:  AROM AROM (Normal range in degrees) AROM  Hip Right Left  Flexion (125)    Extension (15)    Abduction (40)    Adduction     Internal Rotation (45)    External Rotation (45)        Knee    Flexion (135)    Extension (0)        Ankle    Dorsiflexion (20)    Plantarflexion (50)    Inversion (35)    Eversion (15    (* = pain; Blank rows = not tested)  LE MMT: MMT (out of 5) Right Left  Hip flexion 4- 4-  Hip extension    Hip abduction    Hip adduction    Hip internal rotation     Hip external rotation    Knee flexion 4- 4-  Knee extension 4 4  Ankle dorsiflexion 5 5  Ankle plantarflexion 5 5  Ankle inversion    Ankle eversion    (* = pain; Blank rows = not tested)  Sensation Grossly intact to light touch bilateral LEs as determined by testing dermatomes L2-S2. Proprioception, and hot/cold testing deferred on this date.  Reflexes R/L Knee Jerk (L3/4): 2+/1+  Ankle Jerk (S1/2): 2+/1+  Muscle Length Not tested  Palpation Location LEFT  RIGHT           Quadriceps 0 0  Medial Hamstrings 0 0  Lateral Hamstrings 0 0  Lateral Hamstring tendon  Medial Hamstring tendon    Quadriceps tendon 0 0  Patella    Patellar Tendon    Tibial Tuberosity    Medial joint line    Lateral joint line    MCL    LCL    Adductor Tubercle    Pes Anserine tendon    Infrapatellar fat pad    Fibular head    Popliteal fossa    (Blank rows = not tested) Graded on 0-4 scale (0 = no pain, 1 = pain, 2 = pain with wincing/grimacing/flinching, 3 = pain with withdrawal, 4 = unwilling to allow palpation), (Blank rows = not tested)  Passive Accessory Motion Deferred  Functional Test:  : 19.77 5TSTS: 15.88s  TODAY'S TREATMENT: DATE: 04/25/24  Precaution per MD 10/31: BP should be under 150/90 and above 100/60 in resting position. Systolic of 180-190 with sx prompt emergency care.   Subjective: Patient without new report. Patient reports no pain prior to start of session. No further questions or concerns.   Pre Exercise: Vitals (Seated, Left Brachial):   Blood Pressure: 133/64  HR: 75   Therapeutic Activity with the INTENT to increase walking capacity, stairs, and squatting): NuStep- Pace Partner mode - L6-2 x 5 min x UE/LE (Seat 8) x SPM > 90 to improve LE endurance and strength for increased walking capacity; PT manually adjusted resistance per patient tolerance.    TRX Squats   3 x 10  Knee Extension 3 x 10 - 20 #   Hamstring Curl   3 x 10 - 25#    Neuromuscular Re-Education  Walking with [place] (20#)  n for increased balance agaisnt external perturbation   20 ft - SBA    4 x 20 ft with weaving through cones - SBA   TM Walking with Reactional Direction Training - PT places playing cards (Randomly) down onto TM  2.5 min - BUE support    Obstacle Course Training   5 Hurdles with Cone Tapping Laterally   4 x in forward direction    5 Hurdles with Forward Cone Tapping    4 x Side ways direction  Ball Toss/Catch on Airex Pad   2 x 10 - Two instances requiring stepping reaction however balance maintained with visble ankle strategy.   PATIENT EDUCATION:   Education details: Exercise Technique  Person educated: Patient Education method: Explanation, Demonstration, and Handouts Education comprehension: verbalized understanding and returned demonstration  HOME EXERCISE PROGRAM:  Access Code: [email or web address] URL: [email or web address] Date: 03/06/2024 Prepared by: Lonni Pall  Exercises - Standing March with Counter Support  - 1 x daily - 3-4 x weekly - 2-3 sets - 10-12 reps - Feet Together Balance at [employer] Eyes Closed  - 1 x daily - 7 x weekly - 3 sets - 30 hold - Sit to Stand with Arms Crossed  - 1 x daily - 7 x weekly - 3 sets - 10 reps - Semi-Tandem Balance at [employer] Eyes Open  - 1 x daily - 7 x weekly - 5 sets - 10 hold - Tandem Walking with Counter Support  - 1 x daily - 7 x weekly - 3 sets - 10 reps - Mini Squat with Counter Support  - 1 x daily - 3-4 x weekly - 2-3 sets - 10 reps  Access Code: [email or web address] URL: [email or web address] Date: 01/24/2024 Prepared by: Lonni Pall  Exercises - Standing March with Counter Support  - 1 x daily - 3-4 x weekly - 2-3 sets - 10-12  reps - Feet Together Balance at [employer] Closed  - 1 x daily - 7 x weekly - 3 sets - 30 hold - Sit to Stand with Armchair  - 1 x daily - 3-4 x weekly - 2-3 sets - 8-10 reps  ASSESSMENT:  CLINICAL  IMPRESSION: Continued PT POC in management of unsteadiness and L knee pain. LE strength addressed in order to improve walking capacity in community. Good ability to maintain balance with hurdle training and reactional training on TM. No physical assistance from PT provided; she was able to maintain balance throughout session with use of ankle or stepping strategies. VSS stable throughout session. PT to re-evaluate progress towards goals in following session.  Based on today's performance, patient will still benefit from skilled physical therapy in order to maximize decreased fall risk for safe discharge and improved QoL.     OBJECTIVE IMPAIRMENTS: Abnormal gait, decreased activity tolerance, decreased balance, decreased endurance, difficulty walking, decreased ROM, decreased strength, impaired sensation, and pain.   ACTIVITY LIMITATIONS: carrying, lifting, standing, squatting, and stairs  PARTICIPATION LIMITATIONS: shopping, community activity, and yard work  PERSONAL FACTORS: Age, Past/current experiences, Profession, Sex, Time since onset of injury/illness/exacerbation, and 1-2 comorbidities: HTN, Hx of TIA are also affecting patient's functional outcome.   REHAB POTENTIAL: Fair    CLINICAL DECISION MAKING: Evolving/moderate complexity  EVALUATION COMPLEXITY: Moderate   GOALS: Goals reviewed with patient? Yes  SHORT TERM GOALS: Target date: 03/06/2024  Pt will be independent with HEP to improve strength and decrease knee pain to improve pain-free function at home and work. Baseline: 01/24/2024: Initial HEP provided; 03/06/2024: Pt endorsed 65% adherence Goal status: Progressing   LONG TERM GOALS: Target date: 05/01/2024   Pt will improve ABC by at least 13% in order to demonstrate clinically significant improvement in balance confidence. Baseline: 01/24/2024: ABC score: 1190 / 1600 = 74.4 %;  03/06/2024: 85.62% Goal status: Progressing  2.  Pt will decrease worst knee pain by at  least 3 points on the NPRS in order to demonstrate clinically significant reduction in knee pain. Baseline: 01/24/2024: 09/10 NPS; 02/28/2024: 10/10 NPS; 03/27/2024:  Goal status: Progressing   3.  Pt will increase LEFS score by at least 9 points in order demonstrate clinically significant reduction in knee pain/disability.    Baseline: 01/24/2024: 48/80 (80/80 = no disability); 03/06/2024: 63/80 (80/80 = no disability) Goal status: Goal Met   4.  Pt will increase strength of knee flexion/extension by at least 1/2 MMT grade in order to demonstrate improvement in strength and function  Baseline: 01/24/2024: R/L: 4- knee Flexion, R/L 4/5 Knee Extension; 03/06/2024: R/L: 4+/4+ knee flexion, R/L: 4+/4+ Knee extension Goal status: Progressing  5.  Pt will increase by at least 0.13 m/s in order to demonstrate clinically significant improvement in community ambulation.  Baseline: 01/24/2024: 19.77s without AD; 03/02/2024: .87 m/s without AD; 03/06/2024: 1.02 m/s Goal status: Goal met  6.  Pt will decrease 5TSTS by at least 3 seconds in order to demonstrate clinically significant improvement in LE strength and functional ability.  Baseline: 01/24/2024: 15.88s; 03/06/2024: 9.85 m/s Goal status: Goal Met  7.  Pt will increase 30s STS repetitions by 5 reps in order to demonstrate improvements in LE strength and endurance.  Baseline: 03/06/2024: 17 reps  Goal status: Initial   8.  The patient will improve their Functional Gait Assessment (FGA) score from to >=22/30 to reduce fall risk and improve community mobility. Baseline: 03/06/2024: 20/30 ( <22/30 - high fall  risk)   Goal status: Initial   PLAN:  PT FREQUENCY: 1-2x/week  PT DURATION: 12 weeks  PLANNED INTERVENTIONS: 97110-Therapeutic exercises, 97530- Therapeutic activity, 97112- Neuromuscular re-education, 97535- Self Care, [id number]- Manual therapy, [phone]- Gait training, Patient/Family education, Balance training, Stair training,  Cryotherapy, and Moist heat  PLAN FOR NEXT SESSION: Progressing Gait Training with AD, Balance Training, LE strengthening, Reactional Training   Lonni Pall PT, DPT Physical Therapist- [place]  04/25/2024, 1:06 PM  "

## 2024-04-27 ENCOUNTER — Ambulatory Visit

## 2024-04-27 DIAGNOSIS — R2681 Unsteadiness on feet: Secondary | ICD-10-CM

## 2024-04-27 DIAGNOSIS — G8929 Other chronic pain: Secondary | ICD-10-CM

## 2024-04-27 DIAGNOSIS — M6281 Muscle weakness (generalized): Secondary | ICD-10-CM

## 2024-04-27 NOTE — Therapy (Addendum)
 " OUTPATIENT PHYSICAL THERAPY KNEE/BALANCE TREATMENT/PROGRESS NOTE/RECERTIFICATION Dates of reporting period  03/06/2024   to   04/27/2024     Patient Name: Kiara [name] MRN: [id number] DOB:[birth year], 89 y.o., female Today's Date: 04/27/2024  END OF SESSION:  PT End of Session - 04/27/24 1308     Visit Number 20    Number of Visits 25    Date for Recertification  05/01/24    Authorization Type auth 26 visits  10/27 - 05/27/24  Authorization #[id number]    Authorization Time Period 10/27 - 05/27/24    Authorization - Number of Visits 26    Progress Note Due on Visit 20    PT Start Time 1307    PT Stop Time 1345    PT Time Calculation (min) 38 min    Activity Tolerance Patient tolerated treatment well    Behavior During Therapy WFL for tasks assessed/performed          Past Medical History:  Diagnosis Date   Arthritis    osteoarthritis -right knee.   C. difficile colitis    past,no recent issues   Colitis    Diverticulitis    Fall    tripped-10 days ago-scrapped right knee and lateral right brow area- healing   GERD (gastroesophageal reflux disease)    occ.   Seasonal allergies    Past Surgical History:  Procedure Laterality Date   APPENDECTOMY     CATARACT EXTRACTION, BILATERAL     EYE SURGERY     ? right eye retina repair.   HEMORRHOID SURGERY     KNEE ARTHROSCOPY Right 11/26/2014   Procedure: ARTHROSCOPY KNEE, Tear medical horn and anterior horn. Lateral mesicus tear, grade 3 medial;  Surgeon: Kiara SHAUNNA Hue, MD;  Location: [facility];  Service: Orthopedics;  Laterality: Right;   MOLE REMOVAL     benign   TONSILLECTOMY     TOTAL KNEE ARTHROPLASTY Right 02/12/2016   Procedure: RIGHT TOTAL KNEE ARTHROPLASTY;  Surgeon: Kiara Moan, MD;  Location: [facility];  Service: Orthopedics;  Laterality: Right;   Patient Active Problem List   Diagnosis Date Noted   Mild cognitive impairment 02/02/2022   TIA (transient ischemic attack) 08/08/2020   Degenerative lumbar  spinal stenosis 06/29/2019   Pulmonary nodules/lesions, multiple 02/08/2019   Exudative age-related macular degeneration of left eye with active choroidal neovascularization (HCC) 01/20/2019   Idiopathic peripheral neuropathy 01/20/2019   Radial styloid tenosynovitis 03/28/2018   Pain in right knee 02/23/2018   History of Clostridium difficile colitis 12/16/2017   Irritable bowel syndrome without diarrhea 12/16/2017   Cataract cortical, senile 08/03/2017   Chronic colitis 08/03/2017   Medicare annual wellness visit, initial 12/15/2016   Benign essential hypertension 11/04/2016   OA (osteoarthritis) of knee 02/12/2016   Hyperlipidemia, mixed 10/24/2015   Clostridium difficile colitis 01/02/2014   Right bundle branch block 10/18/2013    PCP: Kiara Oneil FALCON, MD  REFERRING PROVIDER: Marchia Drivers, MD  REFERRING DIAG: M17.0 (ICD-10-CM) - Primary osteoarthritis of both knees   RATIONALE FOR EVALUATION AND TREATMENT: Rehabilitation  THERAPY DIAG: Unsteadiness on feet  Muscle weakness (generalized)  Chronic pain of both knees  ONSET DATE: Chronic     FOLLOW-UP APPT SCHEDULED WITH REFERRING PROVIDER: No    SUBJECTIVE:  SUBJECTIVE STATEMENT:    Patient is an 89 year old female presenting with unsteadiness and R/L knee pain.   PERTINENT HISTORY:   Kiara [name] is an 89 year old female presenting to OPPT with a chief concern of balance deficits and bilateral knee pain. She has a history of TIA, falls and lumbar spinal stenosis and R TKA. Patient received corticosteroid injection the L knee. History of balance deficits primarily due to baseline peripheral neuropathy in bilateral feet contributing to difficulty walking. She reports that her balance has worsened within the last month. Patient  currently walking with hurry cane in the community. She reports that she drifts to one side (L > R) while walking. Patient reports that she will have directed radiation towards the L knee performed by her oncologist.   R Hand Dominant.   Imaging: No   PAIN:   Pain Intensity: Present: 0/10, Best: 0/10, Worst: 9/10 Pain location: bilateral knees  Pain quality: intermittent and sharp  Radiating pain: No  Swelling: No  Numbness/Tingling: Yes Focal weakness or buckling: No Aggravating factors: Walking Relieving factors: Sitting   PRECAUTIONS: Fall  WEIGHT BEARING RESTRICTIONS: No  FALLS: Has patient fallen in last 6 months? Yes. Number of falls 1  Living Environment Lives with: lives alone Lives in: House/apartment Stairs: No Has following equipment at home: Vannie - 4 wheeled and hurry cane  Prior level of function: Independent  Occupational demands: Retired  Presenter, Broadcasting: Taking care of her dog, walking   Patient Goals: I would like to be able to walk without assistance    OBJECTIVE:   PATIENT SURVEYS:  LEFS: 48/80 (80/80 = no disability) ABC: 74/100% - Moderate confidence    Cognition Patient is oriented to person, place, and time.  Recent memory is intact.  Accompanied by daughter: Kiara [name]   Gross Musculoskeletal Assessment Tremor: None Bulk: Normal Tone: Normal  GAIT: Distance walked: 16m  Assistive device utilized: hurry cane Level of assistance: Modified independence Comments: R hand hurry cane, reciprocal gait   Posture:  AROM AROM (Normal range in degrees) AROM  Hip Right Left  Flexion (125)    Extension (15)    Abduction (40)    Adduction     Internal Rotation (45)    External Rotation (45)        Knee    Flexion (135)    Extension (0)        Ankle    Dorsiflexion (20)    Plantarflexion (50)    Inversion (35)    Eversion (15    (* = pain; Blank rows = not tested)  LE MMT: MMT (out of 5) Right Left  Hip flexion 4- 4-  Hip  extension    Hip abduction    Hip adduction    Hip internal rotation    Hip external rotation    Knee flexion 4- 4-  Knee extension 4 4  Ankle dorsiflexion 5 5  Ankle plantarflexion 5 5  Ankle inversion    Ankle eversion    (* = pain; Blank rows = not tested)  Sensation Grossly intact to light touch bilateral LEs as determined by testing dermatomes L2-S2. Proprioception, and hot/cold testing deferred on this date.  Reflexes R/L Knee Jerk (L3/4): 2+/1+  Ankle Jerk (S1/2): 2+/1+  Muscle Length Not tested  Palpation Location LEFT  RIGHT           Quadriceps 0 0  Medial Hamstrings 0 0  Lateral Hamstrings 0 0  Lateral Hamstring tendon  Medial Hamstring tendon    Quadriceps tendon 0 0  Patella    Patellar Tendon    Tibial Tuberosity    Medial joint line    Lateral joint line    MCL    LCL    Adductor Tubercle    Pes Anserine tendon    Infrapatellar fat pad    Fibular head    Popliteal fossa    (Blank rows = not tested) Graded on 0-4 scale (0 = no pain, 1 = pain, 2 = pain with wincing/grimacing/flinching, 3 = pain with withdrawal, 4 = unwilling to allow palpation), (Blank rows = not tested)  Passive Accessory Motion Deferred  Functional Test:  : 19.77 5TSTS: 15.88s  TODAY'S TREATMENT: DATE: 04/27/24  Precaution per MD 10/31: BP should be under 150/90 and above 100/60 in resting position. Systolic of 180-190 with sx prompt emergency care.   Subjective: Patient reports no near falls or falls since last appointment. Patient reports a 1/10 in her L knee prior to start of session.  No further questions or concerns.   Pre Exercise: Vitals (Seated, Left Brachial):   Blood Pressure: 136/63  HR: 74   Physical Performance Measures:   30s STS: 13 reps  30s STS: 13 (Age related score - 8-9)   The 30s STS test assesses lower body strength and endurance by measuring how many times a person can stand up from and sit down on a chair in 30 seconds   [facility] PT  Assessment - 04/27/24 0001       Functional Gait  Assessment   Gait assessed  Yes    Gait Level Surface Walks 20 ft in less than 5.5 sec, no assistive devices, good speed, no evidence for imbalance, normal gait pattern, deviates no more than 6 in outside of the 12 in walkway width.    Change in Gait Speed Able to smoothly change walking speed without loss of balance or gait deviation. Deviate no more than 6 in outside of the 12 in walkway width.    Gait with Horizontal Head Turns Performs head turns smoothly with slight change in gait velocity (eg, minor disruption to smooth gait path), deviates 6-10 in outside 12 in walkway width, or uses an assistive device.    Gait with Vertical Head Turns Performs head turns with no change in gait. Deviates no more than 6 in outside 12 in walkway width.    Gait and Pivot Turn Pivot turns safely within 3 sec and stops quickly with no loss of balance.    Step Over Obstacle Is able to step over one shoe box (4.5 in total height) without changing gait speed. No evidence of imbalance.    Gait with Narrow Base of Support Ambulates 4-7 steps.    Gait with Eyes Closed Cannot walk 20 ft without assistance, severe gait deviations or imbalance, deviates greater than 15 in outside 12 in walkway width or will not attempt task.    Ambulating Backwards Walks 20 ft, no assistive devices, good speed, no evidence for imbalance, normal gait    Steps Alternating feet, must use rail.    Total Score 22    FGA comment: w/o AD          Neuromuscular Re-Education  NuStep L5-3 x 3 min x UE/LE (Seat 9) to improve LE endurance and strength for increased walking capacity; PT manually adjusted resistance per patient tolerance.    TM Walking with Warehouse Manager - PT places playing cards (Randomly) down  onto TM  5 min - SUE support   - SUE Support   - Good ability to navigate around cards and maintain narrow BoS while walking with rail support  Tandem Walking   10  ft - 4x   - CGA, patient able to consecutively step 7-8 times without PT assistance.  Sit to Stand from Arm Chair   1 x 10   PATIENT EDUCATION:   Education details: Exercise Technique  Person educated: Patient Education method: Explanation, Demonstration, and Handouts Education comprehension: verbalized understanding and returned demonstration  HOME EXERCISE PROGRAM:  Access Code: [email or web address] URL: [email or web address] Date: 03/06/2024 Prepared by: Lonni Pall  Exercises - Standing March with Counter Support  - 1 x daily - 3-4 x weekly - 2-3 sets - 10-12 reps - Feet Together Balance at [employer] Eyes Closed  - 1 x daily - 7 x weekly - 3 sets - 30 hold - Sit to Stand with Arms Crossed  - 1 x daily - 7 x weekly - 3 sets - 10 reps - Semi-Tandem Balance at [employer] Eyes Open  - 1 x daily - 7 x weekly - 5 sets - 10 hold - Tandem Walking with Counter Support  - 1 x daily - 7 x weekly - 3 sets - 10 reps - Mini Squat with Counter Support  - 1 x daily - 3-4 x weekly - 2-3 sets - 10 reps  Access Code: [email or web address] URL: [email or web address] Date: 01/24/2024 Prepared by: Lonni Pall  Exercises - Standing March with Counter Support  - 1 x daily - 3-4 x weekly - 2-3 sets - 10-12 reps - Feet Together Balance at [employer] Eyes Closed  - 1 x daily - 7 x weekly - 3 sets - 30 hold - Sit to Stand with Armchair  - 1 x daily - 3-4 x weekly - 2-3 sets - 8-10 reps  ASSESSMENT:  CLINICAL IMPRESSION: Patient arrives for 20th visit warranting progress note towards PT goals. Reassessed balance and LE strength with FGA and 30s STS test (see above). Patient's dynamic balance has improved since last progress note however she still is classified as a high fall risk per score. Additionally her 30s STS indicates that her LE strength has weakened since last PT progress note however she remains above age related norms. Remainder of the session focused on dynamic balance with  focused on her deficits per FGA (narrow BoS and tandem stepping). VSS throughout session. PT POC remains appropriate and PT will continue to progress interventions as tolerated. Based on today's performance, patient will still benefit from skilled physical therapy in order to maximize decreased fall risk for safe discharge and improved QoL. PT requesting for an additional 2x/week for 4 weeks in order to maximize return to PLOF and safe discharge to HEP.   OBJECTIVE IMPAIRMENTS: Abnormal gait, decreased activity tolerance, decreased balance, decreased endurance, difficulty walking, decreased ROM, decreased strength, impaired sensation, and pain.   ACTIVITY LIMITATIONS: carrying, lifting, standing, squatting, and stairs  PARTICIPATION LIMITATIONS: shopping, community activity, and yard work  PERSONAL FACTORS: Age, Past/current experiences, Profession, Sex, Time since onset of injury/illness/exacerbation, and 1-2 comorbidities: HTN, Hx of TIA are also affecting patient's functional outcome.   REHAB POTENTIAL: Fair    CLINICAL DECISION MAKING: Evolving/moderate complexity  EVALUATION COMPLEXITY: Moderate   GOALS: Goals reviewed with patient? Yes  SHORT TERM GOALS: Target date: 03/06/2024  Pt will be independent with HEP to improve strength and decrease knee pain to improve pain-free function at home  and work. Baseline: 01/24/2024: Initial HEP provided; 03/06/2024: Pt endorsed 65% adherence Goal status: Progressing   LONG TERM GOALS: Target date: 05/25/2024    Pt will improve ABC by at least 13% in order to demonstrate clinically significant improvement in balance confidence. Baseline: 01/24/2024: ABC score: 1190 / 1600 = 74.4 %;  03/06/2024: 85.62%;  04/27/2024: 88%  Goal status: Progressing  2.  Pt will decrease worst knee pain by at least 3 points on the NPRS in order to demonstrate clinically significant reduction in knee pain. Baseline: 01/24/2024: 09/10 NPS; 02/28/2024: 10/10 NPS;  04/28/2023:5/10 Goal status: Goal met    3.  Pt will increase LEFS score by at least 9 points in order demonstrate clinically significant reduction in knee pain/disability.    Baseline: 01/24/2024: 48/80 (80/80 = no disability); 03/06/2024: 63/80 (80/80 = no disability) Goal status: Goal Met   4.  Pt will increase strength of knee flexion/extension by at least 1/2 MMT grade in order to demonstrate improvement in strength and function  Baseline: 01/24/2024: R/L: 4- knee Flexion, R/L 4/5 Knee Extension; 03/06/2024: R/L: 4+/4+ knee flexion, R/L: 4+/4+ Knee extension; 04/27/2024:R/L Knee flexion: 5/4, R/L Knee Extension: 5/4 Goal status: Progressing  5.  Pt will increase by at least 0.13 m/s in order to demonstrate clinically significant improvement in community ambulation.  Baseline: 01/24/2024: 19.77s without AD; 03/02/2024: .87 m/s without AD; 03/06/2024: 1.02 m/s Goal status: Goal met  6.  Pt will decrease 5TSTS by at least 3 seconds in order to demonstrate clinically significant improvement in LE strength and functional ability.  Baseline: 01/24/2024: 15.88s; 03/06/2024: 9.85 m/s Goal status: Goal Met  7.  Pt will increase 30s STS repetitions by 5 reps in order to demonstrate improvements in LE strength and endurance.  Baseline: 03/06/2024: 17 reps; 04/27/2024: 13 reps Goal status: Progressing   8.  The patient will improve their Functional Gait Assessment (FGA) score from to >=22/30 to reduce fall risk and improve community mobility. Baseline: 03/06/2024: 20/30 ( <22/30 - high fall risk); 04/27/2024: 22/30  Goal status: Progressing   PLAN:  PT FREQUENCY: 1-2x/week  PT DURATION: 4 Weeks  PLANNED INTERVENTIONS: 97110-Therapeutic exercises, 97530- Therapeutic activity, 97112- Neuromuscular re-education, 97535- Self Care, [id number]- Manual therapy, [phone]- Gait training, Patient/Family education, Balance training, Stair training, Cryotherapy, and Moist heat  PLAN FOR NEXT SESSION:  Progressing Gait Training with AD, Balance Training, LE strengthening, Reactional Training   Lonni Pall PT, DPT Physical Therapist- [place]  04/27/2024, 1:13 PM  "

## 2024-04-27 NOTE — Addendum Note (Signed)
 Addended by: KRISTA LONNI PARAS on: 04/27/2024 02:01 PM   Modules accepted: Orders

## 2024-05-01 ENCOUNTER — Ambulatory Visit

## 2024-05-03 ENCOUNTER — Ambulatory Visit

## 2024-05-03 DIAGNOSIS — R2681 Unsteadiness on feet: Secondary | ICD-10-CM

## 2024-05-03 DIAGNOSIS — G8929 Other chronic pain: Secondary | ICD-10-CM

## 2024-05-03 DIAGNOSIS — M6281 Muscle weakness (generalized): Secondary | ICD-10-CM

## 2024-05-03 NOTE — Therapy (Signed)
 " OUTPATIENT PHYSICAL THERAPY KNEE/BALANCE TREATMENT   Patient Name: Kiara [name] MRN: [id number] DOB:Jan 30, 1935, 89 y.o., female Today's Date: 05/03/2024  END OF SESSION:  PT End of Session - 05/03/24 1438     Visit Number 21    Number of Visits 25    Date for Recertification  05/25/24    Authorization Type auth 26 visits  10/27 - 05/27/24  Authorization #[id number]    Authorization Time Period 10/27 - 05/27/24    Authorization - Visit Number 21    Authorization - Number of Visits 26    Progress Note Due on Visit 20    PT Start Time 1435    PT Stop Time 1515    PT Time Calculation (min) 40 min    Activity Tolerance Patient tolerated treatment well    Behavior During Therapy WFL for tasks assessed/performed           Past Medical History:  Diagnosis Date   Arthritis    osteoarthritis -right knee.   C. difficile colitis    past,no recent issues   Colitis    Diverticulitis    Fall    tripped-10 days ago-scrapped right knee and lateral right brow area- healing   GERD (gastroesophageal reflux disease)    occ.   Seasonal allergies    Past Surgical History:  Procedure Laterality Date   APPENDECTOMY     CATARACT EXTRACTION, BILATERAL     EYE SURGERY     ? right eye retina repair.   HEMORRHOID SURGERY     KNEE ARTHROSCOPY Right 11/26/2014   Procedure: ARTHROSCOPY KNEE, Tear medical horn and anterior horn. Lateral mesicus tear, grade 3 medial;  Surgeon: Lynwood SHAUNNA Hue, MD;  Location: [facility];  Service: Orthopedics;  Laterality: Right;   MOLE REMOVAL     benign   TONSILLECTOMY     TOTAL KNEE ARTHROPLASTY Right 02/12/2016   Procedure: RIGHT TOTAL KNEE ARTHROPLASTY;  Surgeon: Dempsey Moan, MD;  Location: [facility];  Service: Orthopedics;  Laterality: Right;   Patient Active Problem List   Diagnosis Date Noted   Mild cognitive impairment 02/02/2022   TIA (transient ischemic attack) 08/08/2020   Degenerative lumbar spinal stenosis 06/29/2019   Pulmonary  nodules/lesions, multiple 02/08/2019   Exudative age-related macular degeneration of left eye with active choroidal neovascularization (HCC) 01/20/2019   Idiopathic peripheral neuropathy 01/20/2019   Radial styloid tenosynovitis 03/28/2018   Pain in right knee 02/23/2018   History of Clostridium difficile colitis 12/16/2017   Irritable bowel syndrome without diarrhea 12/16/2017   Cataract cortical, senile 08/03/2017   Chronic colitis 08/03/2017   Medicare annual wellness visit, initial 12/15/2016   Benign essential hypertension 11/04/2016   OA (osteoarthritis) of knee 02/12/2016   Hyperlipidemia, mixed 10/24/2015   Clostridium difficile colitis 01/02/2014   Right bundle branch block 10/18/2013    PCP: Cleotilde Oneil FALCON, MD  REFERRING PROVIDER: Marchia Drivers, MD  REFERRING DIAG: M17.0 (ICD-10-CM) - Primary osteoarthritis of both knees   RATIONALE FOR EVALUATION AND TREATMENT: Rehabilitation  THERAPY DIAG: Unsteadiness on feet  Muscle weakness (generalized)  Chronic pain of both knees  ONSET DATE: Chronic     FOLLOW-UP APPT SCHEDULED WITH REFERRING PROVIDER: No    SUBJECTIVE:  SUBJECTIVE STATEMENT:    Patient is an 89 year old female presenting with unsteadiness and R/L knee pain.   PERTINENT HISTORY:   Kiara [name] is an 89 year old female presenting to OPPT with a chief concern of balance deficits and bilateral knee pain. She has a history of TIA, falls and lumbar spinal stenosis and R TKA. Patient received corticosteroid injection the L knee. History of balance deficits primarily due to baseline peripheral neuropathy in bilateral feet contributing to difficulty walking. She reports that her balance has worsened within the last month. Patient currently walking with hurry cane in the  community. She reports that she drifts to one side (L > R) while walking. Patient reports that she will have directed radiation towards the L knee performed by her oncologist.   R Hand Dominant.   Imaging: No   PAIN:   Pain Intensity: Present: 0/10, Best: 0/10, Worst: 9/10 Pain location: bilateral knees  Pain quality: intermittent and sharp  Radiating pain: No  Swelling: No  Numbness/Tingling: Yes Focal weakness or buckling: No Aggravating factors: Walking Relieving factors: Sitting   PRECAUTIONS: Fall  WEIGHT BEARING RESTRICTIONS: No  FALLS: Has patient fallen in last 6 months? Yes. Number of falls 1  Living Environment Lives with: lives alone Lives in: House/apartment Stairs: No Has following equipment at home: Vannie - 4 wheeled and hurry cane  Prior level of function: Independent  Occupational demands: Retired  Presenter, Broadcasting: Taking care of her dog, walking   Patient Goals: I would like to be able to walk without assistance    OBJECTIVE:   PATIENT SURVEYS:  LEFS: 48/80 (80/80 = no disability) ABC: 74/100% - Moderate confidence    Cognition Patient is oriented to person, place, and time.  Recent memory is intact.  Accompanied by daughter: [name]   Gross Musculoskeletal Assessment Tremor: None Bulk: Normal Tone: Normal  GAIT: Distance walked: 5m  Assistive device utilized: hurry cane Level of assistance: Modified independence Comments: R hand hurry cane, reciprocal gait   Posture:  AROM AROM (Normal range in degrees) AROM  Hip Right Left  Flexion (125)    Extension (15)    Abduction (40)    Adduction     Internal Rotation (45)    External Rotation (45)        Knee    Flexion (135)    Extension (0)        Ankle    Dorsiflexion (20)    Plantarflexion (50)    Inversion (35)    Eversion (15    (* = pain; Blank rows = not tested)  LE MMT: MMT (out of 5) Right Left  Hip flexion 4- 4-  Hip extension    Hip abduction    Hip adduction     Hip internal rotation    Hip external rotation    Knee flexion 4- 4-  Knee extension 4 4  Ankle dorsiflexion 5 5  Ankle plantarflexion 5 5  Ankle inversion    Ankle eversion    (* = pain; Blank rows = not tested)  Sensation Grossly intact to light touch bilateral LEs as determined by testing dermatomes L2-S2. Proprioception, and hot/cold testing deferred on this date.  Reflexes R/L Knee Jerk (L3/4): 2+/1+  Ankle Jerk (S1/2): 2+/1+  Muscle Length Not tested  Palpation Location LEFT  RIGHT           Quadriceps 0 0  Medial Hamstrings 0 0  Lateral Hamstrings 0 0  Lateral Hamstring tendon  Medial Hamstring tendon    Quadriceps tendon 0 0  Patella    Patellar Tendon    Tibial Tuberosity    Medial joint line    Lateral joint line    MCL    LCL    Adductor Tubercle    Pes Anserine tendon    Infrapatellar fat pad    Fibular head    Popliteal fossa    (Blank rows = not tested) Graded on 0-4 scale (0 = no pain, 1 = pain, 2 = pain with wincing/grimacing/flinching, 3 = pain with withdrawal, 4 = unwilling to allow palpation), (Blank rows = not tested)  Passive Accessory Motion Deferred  Functional Test:  : 19.77 5TSTS: 15.88s  TODAY'S TREATMENT: DATE: 05/03/24  Precaution per MD 10/31: BP should be under 150/90 and above 100/60 in resting position. Systolic of 180-190 with sx prompt emergency care.   Subjective: Patient reports minimal pain in the L knee focal to the Tibialis anterior origin point; hurts more with dorsiflexion.  No further questions or concerns.   Pre Exercise: Vitals (Seated, Left Brachial):   Blood Pressure: 111/52  HR: 74  Neuromuscular Re-education:  TM Walking with Reactional Direction Training - PT places playing cards (Randomly) down onto TM  5 min - SUE to NO support x 1.2 mph   - Good ability to navigate around cards and maintain narrow BoS while walking with rail support  Forward Walking with Eyes Closed  2 x 20 ft - HH  support   1 x 20 ft   - pt unable to maintain midline without PT assistance, two instances of physical asssitance to maintain balance  Tandem Stance   R in front    1 x 10s EO    2 x 10s with horizontal HT - no major LOB, visible ankle strategies while maintaining balance  L in front    1 x 10s - EO    2 x 10s with horizontal HT - no major LOB     Tandem Stepping   On airex beam 4 laps - heavy reliance on UE from PT, ankle and hip strategies use   Even surface - 2 trials - 10 consecutive steps   Wall support tib raises for increased tibialis anterior contraction  2 x 20 reps    Therapeutic Activity:  NuStep L5-3 x 6 min x UE/LE (Seat 9) to improve LE endurance and strength for increased walking capacity; PT manually adjusted resistance per patient tolerance.    Sit to Stand with med ball  press in various direction   2 x 10 - 3 Kg MB (seated rest break in b/t sets)    PATIENT EDUCATION:   Education details: Exercise Technique  Person educated: Patient Education method: Explanation, Demonstration, and Handouts Education comprehension: verbalized understanding and returned demonstration  HOME EXERCISE PROGRAM:  Access Code: [email or web address] URL: [email or web address] Date: 03/06/2024 Prepared by: Lonni Pall  Exercises - Standing March with Counter Support  - 1 x daily - 3-4 x weekly - 2-3 sets - 10-12 reps - Feet Together Balance at [employer] Eyes Closed  - 1 x daily - 7 x weekly - 3 sets - 30 hold - Sit to Stand with Arms Crossed  - 1 x daily - 7 x weekly - 3 sets - 10 reps - Semi-Tandem Balance at [employer] Eyes Open  - 1 x daily - 7 x weekly - 5 sets - 10 hold - Tandem Walking with Counter Support  - 1  x daily - 7 x weekly - 3 sets - 10 reps - Mini Squat with Counter Support  - 1 x daily - 3-4 x weekly - 2-3 sets - 10 reps  Access Code: [email or web address] URL: [email or web address] Date: 01/24/2024 Prepared by: Lonni Pall  Exercises -  Standing March with Counter Support  - 1 x daily - 3-4 x weekly - 2-3 sets - 10-12 reps - Feet Together Balance at [employer] Eyes Closed  - 1 x daily - 7 x weekly - 3 sets - 30 hold - Sit to Stand with Armchair  - 1 x daily - 3-4 x weekly - 2-3 sets - 8-10 reps  ASSESSMENT:  CLINICAL IMPRESSION: Continued PT POC in management of L knee pain and unsteadiness. Session focused on functional strength and challenging patient's balance with various tasks. Pt demonstrated improvements in maintaining tandem stance statically; no LOB with compliant surface. Addtitionally she demonstrated good ability to perform tandem stepping > 7 steps twice without physical assistance from PT. Pt still challenged with tasks requiring eyes closed indicating heavy reliance on PT hand held support to achieve 20 ft. Compliant surfaces significant for visible ankle and hip strategy in order to maintain balance with narrow BoS. PT POC remains appropriate and pt will continue to benefit from skilled PT in order to improve balance and overall QoL.   OBJECTIVE IMPAIRMENTS: Abnormal gait, decreased activity tolerance, decreased balance, decreased endurance, difficulty walking, decreased ROM, decreased strength, impaired sensation, and pain.   ACTIVITY LIMITATIONS: carrying, lifting, standing, squatting, and stairs  PARTICIPATION LIMITATIONS: shopping, community activity, and yard work  PERSONAL FACTORS: Age, Past/current experiences, Profession, Sex, Time since onset of injury/illness/exacerbation, and 1-2 comorbidities: HTN, Hx of TIA are also affecting patient's functional outcome.   REHAB POTENTIAL: Fair    CLINICAL DECISION MAKING: Evolving/moderate complexity  EVALUATION COMPLEXITY: Moderate   GOALS: Goals reviewed with patient? Yes  SHORT TERM GOALS: Target date: 03/06/2024  Pt will be independent with HEP to improve strength and decrease knee pain to improve pain-free function at home and work. Baseline:  01/24/2024: Initial HEP provided; 03/06/2024: Pt endorsed 65% adherence Goal status: Progressing   LONG TERM GOALS: Target date: 05/25/2024    Pt will improve ABC by at least 13% in order to demonstrate clinically significant improvement in balance confidence. Baseline: 01/24/2024: ABC score: 1190 / 1600 = 74.4 %;  03/06/2024: 85.62%;  04/27/2024: 88%  Goal status: Progressing  2.  Pt will decrease worst knee pain by at least 3 points on the NPRS in order to demonstrate clinically significant reduction in knee pain. Baseline: 01/24/2024: 09/10 NPS; 02/28/2024: 10/10 NPS; 04/28/2023:5/10 Goal status: Goal met    3.  Pt will increase LEFS score by at least 9 points in order demonstrate clinically significant reduction in knee pain/disability.    Baseline: 01/24/2024: 48/80 (80/80 = no disability); 03/06/2024: 63/80 (80/80 = no disability) Goal status: Goal Met   4.  Pt will increase strength of knee flexion/extension by at least 1/2 MMT grade in order to demonstrate improvement in strength and function  Baseline: 01/24/2024: R/L: 4- knee Flexion, R/L 4/5 Knee Extension; 03/06/2024: R/L: 4+/4+ knee flexion, R/L: 4+/4+ Knee extension; 04/27/2024:R/L Knee flexion: 5/4, R/L Knee Extension: 5/4 Goal status: Progressing  5.  Pt will increase by at least 0.13 m/s in order to demonstrate clinically significant improvement in community ambulation.  Baseline: 01/24/2024: 19.77s without AD; 03/02/2024: .87 m/s without AD; 03/06/2024: 1.02 m/s Goal status: Goal met  6.  Pt  will decrease 5TSTS by at least 3 seconds in order to demonstrate clinically significant improvement in LE strength and functional ability.  Baseline: 01/24/2024: 15.88s; 03/06/2024: 9.85 m/s Goal status: Goal Met  7.  Pt will increase 30s STS repetitions by 5 reps in order to demonstrate improvements in LE strength and endurance.  Baseline: 03/06/2024: 17 reps; 04/27/2024: 13 reps Goal status: Progressing   8.  The  patient will improve their Functional Gait Assessment (FGA) score from to >=22/30 to reduce fall risk and improve community mobility. Baseline: 03/06/2024: 20/30 ( <22/30 - high fall risk); 04/27/2024: 22/30  Goal status: Progressing   PLAN:  PT FREQUENCY: 1-2x/week  PT DURATION: 4 Weeks  PLANNED INTERVENTIONS: 97110-Therapeutic exercises, 97530- Therapeutic activity, 97112- Neuromuscular re-education, 97535- Self Care, [id number]- Manual therapy, [phone]- Gait training, Patient/Family education, Balance training, Stair training, Cryotherapy, and Moist heat  PLAN FOR NEXT SESSION: Progressing Gait Training with AD, Balance Training, LE strengthening, Reactional Training   Lonni Pall PT, DPT Physical Therapist- [place]  05/03/2024, 2:53 PM  "

## 2024-05-08 ENCOUNTER — Ambulatory Visit

## 2024-05-10 ENCOUNTER — Ambulatory Visit

## 2024-05-15 ENCOUNTER — Ambulatory Visit

## 2024-05-17 ENCOUNTER — Ambulatory Visit

## 2024-05-22 ENCOUNTER — Ambulatory Visit

## 2024-05-24 ENCOUNTER — Ambulatory Visit

## 2024-08-07 ENCOUNTER — Ambulatory Visit: Admitting: Radiation Oncology
